# Patient Record
Sex: Female | Born: 1995 | Race: Black or African American | Hispanic: No | Smoking: Never smoker
Health system: Southern US, Community
[De-identification: ages and names within clinical notes are randomized; demographics above are authoritative.]

## PROBLEM LIST (undated history)

## (undated) ENCOUNTER — Ambulatory Visit (HOSPITAL_COMMUNITY): Admission: EM | Source: Home / Self Care

## (undated) ENCOUNTER — Inpatient Hospital Stay (HOSPITAL_COMMUNITY): Payer: Self-pay

## (undated) ENCOUNTER — Ambulatory Visit (HOSPITAL_COMMUNITY): Payer: Self-pay | Admitting: Obstetrics & Gynecology

## (undated) ENCOUNTER — Encounter (HOSPITAL_COMMUNITY): Payer: Self-pay

## (undated) DIAGNOSIS — N390 Urinary tract infection, site not specified: Secondary | ICD-10-CM

## (undated) DIAGNOSIS — E669 Obesity, unspecified: Secondary | ICD-10-CM

## (undated) HISTORY — PX: INDUCED ABORTION: SHX677

## (undated) HISTORY — DX: Obesity, unspecified: E66.9

## (undated) HISTORY — PX: OTHER SURGICAL HISTORY: SHX169

## (undated) SURGERY — DILATION AND EVACUATION, UTERUS
Anesthesia: Surgeon Has No Preference | Site: Uterus

---
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
## 2015-01-10 ENCOUNTER — Encounter: Payer: Self-pay | Admitting: *Deleted

## 2015-01-10 DIAGNOSIS — Z3490 Encounter for supervision of normal pregnancy, unspecified, unspecified trimester: Secondary | ICD-10-CM

## 2015-01-10 DIAGNOSIS — D573 Sickle-cell trait: Secondary | ICD-10-CM

## 2015-01-20 ENCOUNTER — Ambulatory Visit (INDEPENDENT_AMBULATORY_CARE_PROVIDER_SITE_OTHER): Payer: Self-pay | Admitting: Obstetrics and Gynecology

## 2015-01-20 ENCOUNTER — Encounter: Payer: Self-pay | Admitting: *Deleted

## 2015-01-20 ENCOUNTER — Encounter: Payer: Self-pay | Admitting: Obstetrics and Gynecology

## 2015-01-20 VITALS — BP 125/80 | HR 104 | Temp 98.0°F | Ht 62.0 in | Wt 246.1 lb

## 2015-01-20 DIAGNOSIS — Z23 Encounter for immunization: Secondary | ICD-10-CM

## 2015-01-20 DIAGNOSIS — Z3493 Encounter for supervision of normal pregnancy, unspecified, third trimester: Secondary | ICD-10-CM

## 2015-01-20 LAB — CBC
HEMATOCRIT: 33.8 % — AB (ref 36.0–46.0)
Hemoglobin: 11 g/dL — ABNORMAL LOW (ref 12.0–15.0)
MCH: 26.4 pg (ref 26.0–34.0)
MCHC: 32.5 g/dL (ref 30.0–36.0)
MCV: 81.3 fL (ref 78.0–100.0)
MPV: 11 fL (ref 8.6–12.4)
PLATELETS: 258 10*3/uL (ref 150–400)
RBC: 4.16 MIL/uL (ref 3.87–5.11)
RDW: 15.5 % (ref 11.5–15.5)
WBC: 12.7 10*3/uL — ABNORMAL HIGH (ref 4.0–10.5)

## 2015-01-20 LAB — POCT URINALYSIS DIP (DEVICE)
Bilirubin Urine: NEGATIVE
Glucose, UA: NEGATIVE mg/dL
HGB URINE DIPSTICK: NEGATIVE
KETONES UR: NEGATIVE mg/dL
Leukocytes, UA: NEGATIVE
Nitrite: NEGATIVE
PH: 6 (ref 5.0–8.0)
PROTEIN: NEGATIVE mg/dL
SPECIFIC GRAVITY, URINE: 1.015 (ref 1.005–1.030)
UROBILINOGEN UA: 0.2 mg/dL (ref 0.0–1.0)

## 2015-01-20 MED ORDER — TETANUS-DIPHTH-ACELL PERTUSSIS 5-2.5-18.5 LF-MCG/0.5 IM SUSP
0.5000 mL | Freq: Once | INTRAMUSCULAR | Status: AC
  Administered 2015-01-20: 0.5 mL via INTRAMUSCULAR

## 2015-01-20 NOTE — Progress Notes (Signed)
Patient reports round ligament pain; states no prenatal care since 18 weeks  New OB & 28 week packets given

## 2015-01-20 NOTE — Progress Notes (Signed)
Subjective:  Beverly Anthony is a 19 y.o. G2P0010 at 7365w6d being seen today for ongoing prenatal care.  Patient reports no complaints.  Contractions: Not present.  Vag. Bleeding: None. Movement: Present. Denies leaking of fluid.   The following portions of the patient's history were reviewed and updated as appropriate: allergies, current medications, past family history, past medical history, past social history, past surgical history and problem list. Problem list updated.  Objective:   Filed Vitals:   01/20/15 0907 01/20/15 0909  BP: 125/80   Pulse: 104   Temp: 98 F (36.7 C)   Height:  5\' 2"  (1.575 m)  Weight: 246 lb 1.6 oz (111.63 kg)     Fetal Status: Fetal Heart Rate (bpm): 147   Movement: Present     General:  Alert, oriented and cooperative. Patient is in no acute distress.  Skin: Skin is warm and dry. No rash noted.   Cardiovascular: Normal heart rate noted  Respiratory: Normal respiratory effort, no problems with respiration noted  Abdomen: Soft, gravid, appropriate for gestational age. Pain/Pressure: Present     Pelvic: Vag. Bleeding: None     Cervical exam deferred        Extremities: Normal range of motion.  Edema: None  Mental Status: Normal mood and affect. Normal behavior. Normal judgment and thought content.   Urinalysis: Urine Protein: Negative Urine Glucose: Negative  Assessment and Plan:  Pregnancy: G2P0010 at 3965w6d  1. Supervision of normal pregnancy, third trimester - Glucose Tolerance, 1 HR (50g) w/o Fasting - CBC - RPR - HIV antibody (with reflex) - records requested for 18-wk anatomy scan - Tdap (BOOSTRIX) injection 0.5 mL; Inject 0.5 mLs into the muscle once. - Flu Vaccine QUAD 36+ mos IM; Standing - Flu Vaccine QUAD 36+ mos IM  # sickle cell trait - testing information given to FOB and discussed importance of this  Preterm labor symptoms and general obstetric precautions including but not limited to vaginal bleeding, contractions, leaking of  fluid and fetal movement were reviewed in detail with the patient. Please refer to After Visit Summary for other counseling recommendations.  Return in about 2 weeks (around 02/03/2015).   Beverly RunningNoah Bedford Kellina Dreese, MD

## 2015-01-21 LAB — HIV ANTIBODY (ROUTINE TESTING W REFLEX): HIV 1&2 Ab, 4th Generation: NONREACTIVE

## 2015-01-21 LAB — GLUCOSE TOLERANCE, 1 HOUR (50G) W/O FASTING: Glucose, 1 Hour GTT: 86 mg/dL (ref 70–140)

## 2015-01-21 LAB — RPR

## 2015-01-26 ENCOUNTER — Other Ambulatory Visit (HOSPITAL_COMMUNITY): Payer: Self-pay

## 2015-02-03 ENCOUNTER — Encounter: Payer: Self-pay | Admitting: Advanced Practice Midwife

## 2015-02-03 ENCOUNTER — Ambulatory Visit (HOSPITAL_COMMUNITY)
Admission: RE | Admit: 2015-02-03 | Discharge: 2015-02-03 | Disposition: A | Discharge status: Home / Self Care | Payer: Medicaid Other | Source: Ambulatory Visit | Attending: Advanced Practice Midwife | Admitting: Advanced Practice Midwife

## 2015-02-03 ENCOUNTER — Ambulatory Visit (INDEPENDENT_AMBULATORY_CARE_PROVIDER_SITE_OTHER): Payer: Medicaid Other | Admitting: Advanced Practice Midwife

## 2015-02-03 VITALS — BP 126/75 | HR 110 | Temp 97.9°F | Wt 244.5 lb

## 2015-02-03 DIAGNOSIS — Z36 Encounter for antenatal screening of mother: Secondary | ICD-10-CM | POA: Insufficient documentation

## 2015-02-03 DIAGNOSIS — Z3A33 33 weeks gestation of pregnancy: Secondary | ICD-10-CM | POA: Diagnosis not present

## 2015-02-03 DIAGNOSIS — Z3493 Encounter for supervision of normal pregnancy, unspecified, third trimester: Secondary | ICD-10-CM

## 2015-02-03 LAB — POCT URINALYSIS DIP (DEVICE)
BILIRUBIN URINE: NEGATIVE
GLUCOSE, UA: NEGATIVE mg/dL
Nitrite: NEGATIVE
Protein, ur: NEGATIVE mg/dL
SPECIFIC GRAVITY, URINE: 1.02 (ref 1.005–1.030)
Urobilinogen, UA: 1 mg/dL (ref 0.0–1.0)
pH: 6 (ref 5.0–8.0)

## 2015-02-03 MED ORDER — PANTOPRAZOLE SODIUM 20 MG PO TBEC
20.0000 mg | DELAYED_RELEASE_TABLET | Freq: Every day | ORAL | Status: DC
Start: 2015-02-03 — End: 2015-02-09

## 2015-02-03 NOTE — Patient Instructions (Signed)

## 2015-02-03 NOTE — Addendum Note (Signed)
Addended by: Garret ReddishBARNES, Phill Steck M on: 02/03/2015 09:17 AM   Modules accepted: Orders

## 2015-02-03 NOTE — Progress Notes (Signed)
Pt reports having severe heart burn TUMS not effective Educated pt on Benefits of Breastfeeding for Mom

## 2015-02-03 NOTE — Progress Notes (Signed)
Subjective:  Beverly Anthony is a 19 y.o. G2P0010 at 5331w6d being seen today for ongoing prenatal care.  Patient reports heartburn.  Tums is not working anymore.  Contractions: Not present.  Vag. Bleeding: None. Movement: Present. Denies leaking of fluid.   The following portions of the patient's history were reviewed and updated as appropriate: allergies, current medications, past family history, past medical history, past social history, past surgical history and problem list. Problem list updated.  Objective:   Filed Vitals:   02/03/15 0825  BP: 126/75  Pulse: 110  Temp: 97.9 F (36.6 C)  Weight: 244 lb 8 oz (110.904 kg)    Fetal Status: Fetal Heart Rate (bpm): 156   Movement: Present     General:  Alert, oriented and cooperative. Patient is in no acute distress.  Skin: Skin is warm and dry. No rash noted.   Cardiovascular: Normal heart rate noted  Respiratory: Normal respiratory effort, no problems with respiration noted  Abdomen: Soft, gravid, appropriate for gestational age. Pain/Pressure: Present     Pelvic: Vag. Bleeding: None     Cervical exam deferred        Extremities: Normal range of motion.  Edema: None  Mental Status: Normal mood and affect. Normal behavior. Normal judgment and thought content.   Urinalysis: Urine Protein: Negative Urine Glucose: Negative  Assessment and Plan:  Pregnancy: G2P0010 at 4131w6d  1. Supervision of normal pregnancy, third trimester        Preterm labor symptoms and general obstetric precautions including but not limited to vaginal bleeding, contractions, leaking of fluid and fetal movement were reviewed in detail with the patient. Please refer to After Visit Summary for other counseling recommendations.  Return in about 2 weeks (around 02/17/2015) for [facility].   Aviva SignsMarie L Wilbon Obenchain, CNM

## 2015-02-09 ENCOUNTER — Encounter (HOSPITAL_COMMUNITY): Payer: Self-pay | Admitting: *Deleted

## 2015-02-09 ENCOUNTER — Encounter: Payer: Self-pay | Admitting: Advanced Practice Midwife

## 2015-02-09 ENCOUNTER — Inpatient Hospital Stay (HOSPITAL_COMMUNITY): Payer: Medicaid Other

## 2015-02-09 ENCOUNTER — Telehealth: Payer: Self-pay | Admitting: General Practice

## 2015-02-09 ENCOUNTER — Inpatient Hospital Stay (HOSPITAL_COMMUNITY)
Admission: AD | Admit: 2015-02-09 | Discharge: 2015-02-09 | Disposition: A | Discharge status: Home / Self Care | Payer: Medicaid Other | Source: Ambulatory Visit | Attending: Obstetrics & Gynecology | Admitting: Obstetrics & Gynecology

## 2015-02-09 DIAGNOSIS — R8271 Bacteriuria: Secondary | ICD-10-CM

## 2015-02-09 DIAGNOSIS — Z3A34 34 weeks gestation of pregnancy: Secondary | ICD-10-CM | POA: Diagnosis not present

## 2015-02-09 DIAGNOSIS — O99613 Diseases of the digestive system complicating pregnancy, third trimester: Secondary | ICD-10-CM | POA: Insufficient documentation

## 2015-02-09 DIAGNOSIS — O26893 Other specified pregnancy related conditions, third trimester: Secondary | ICD-10-CM | POA: Diagnosis present

## 2015-02-09 DIAGNOSIS — R1013 Epigastric pain: Secondary | ICD-10-CM

## 2015-02-09 DIAGNOSIS — O2393 Unspecified genitourinary tract infection in pregnancy, third trimester: Secondary | ICD-10-CM | POA: Insufficient documentation

## 2015-02-09 DIAGNOSIS — O2343 Unspecified infection of urinary tract in pregnancy, third trimester: Secondary | ICD-10-CM

## 2015-02-09 DIAGNOSIS — K8020 Calculus of gallbladder without cholecystitis without obstruction: Secondary | ICD-10-CM | POA: Diagnosis not present

## 2015-02-09 HISTORY — DX: Calculus of gallbladder without cholecystitis without obstruction: K80.20

## 2015-02-09 LAB — COMPREHENSIVE METABOLIC PANEL
ALBUMIN: 2.7 g/dL — AB (ref 3.5–5.0)
ALK PHOS: 122 U/L (ref 38–126)
ALT: 16 U/L (ref 14–54)
ANION GAP: 8 (ref 5–15)
AST: 19 U/L (ref 15–41)
BUN: 5 mg/dL — ABNORMAL LOW (ref 6–20)
CALCIUM: 9.2 mg/dL (ref 8.9–10.3)
CO2: 24 mmol/L (ref 22–32)
Chloride: 103 mmol/L (ref 101–111)
Creatinine, Ser: 0.56 mg/dL (ref 0.44–1.00)
GFR calc Af Amer: >60 mL/min (ref >60)
GFR calc non Af Amer: >60 mL/min (ref >60)
GLUCOSE: 113 mg/dL — AB (ref 65–99)
POTASSIUM: 3.7 mmol/L (ref 3.5–5.1)
SODIUM: 135 mmol/L (ref 135–145)
Total Bilirubin: 0.5 mg/dL (ref 0.3–1.2)
Total Protein: 7.1 g/dL (ref 6.5–8.1)

## 2015-02-09 LAB — URINALYSIS, ROUTINE W REFLEX MICROSCOPIC
Bilirubin Urine: NEGATIVE
Glucose, UA: NEGATIVE mg/dL
Hgb urine dipstick: NEGATIVE
Ketones, ur: 15 mg/dL — AB
NITRITE: NEGATIVE
Protein, ur: NEGATIVE mg/dL
SPECIFIC GRAVITY, URINE: 1.02 (ref 1.005–1.030)
pH: 7.5 (ref 5.0–8.0)

## 2015-02-09 LAB — CBC
HCT: 30.5 % — ABNORMAL LOW (ref 36.0–46.0)
Hemoglobin: 10.4 g/dL — ABNORMAL LOW (ref 12.0–15.0)
MCH: 26.8 pg (ref 26.0–34.0)
MCHC: 34.1 g/dL (ref 30.0–36.0)
MCV: 78.6 fL (ref 78.0–100.0)
PLATELETS: 185 10*3/uL (ref 150–400)
RBC: 3.88 MIL/uL (ref 3.87–5.11)
RDW: 14.5 % (ref 11.5–15.5)
WBC: 15.7 10*3/uL — ABNORMAL HIGH (ref 4.0–10.5)

## 2015-02-09 LAB — URINE MICROSCOPIC-ADD ON

## 2015-02-09 LAB — RAPID URINE DRUG SCREEN, HOSP PERFORMED
AMPHETAMINES: NOT DETECTED
Barbiturates: NOT DETECTED
Benzodiazepines: NOT DETECTED
Cocaine: NOT DETECTED
Opiates: NOT DETECTED
Tetrahydrocannabinol: NOT DETECTED

## 2015-02-09 LAB — LIPASE, BLOOD: Lipase: 34 U/L (ref 11–51)

## 2015-02-09 MED ORDER — DEXTROSE 5 % IV SOLN
2.0000 g | Freq: Once | INTRAVENOUS | Status: DC
  Filled 2015-02-09: qty 2

## 2015-02-09 MED ORDER — ONDANSETRON 4 MG PO TBDP
4.0000 mg | ORAL_TABLET | Freq: Once | ORAL | Status: DC
  Filled 2015-02-09: qty 1

## 2015-02-09 MED ORDER — FAMOTIDINE IN NACL 20-0.9 MG/50ML-% IV SOLN
20.0000 mg | Freq: Once | INTRAVENOUS | Status: AC
  Administered 2015-02-09: 20 mg via INTRAVENOUS
  Filled 2015-02-09: qty 50

## 2015-02-09 MED ORDER — DEXTROSE 5 % IV SOLN
2.0000 g | INTRAVENOUS | Status: DC
  Administered 2015-02-09: 2 g via INTRAVENOUS
  Filled 2015-02-09: qty 2

## 2015-02-09 MED ORDER — OXYCODONE-ACETAMINOPHEN 5-325 MG PO TABS
2.0000 | ORAL_TABLET | Freq: Once | ORAL | Status: AC
  Administered 2015-02-09: 2 via ORAL
  Filled 2015-02-09: qty 2

## 2015-02-09 MED ORDER — GI COCKTAIL ~~LOC~~
30.0000 mL | Freq: Once | ORAL | Status: AC
  Administered 2015-02-09: 30 mL via ORAL
  Filled 2015-02-09: qty 30

## 2015-02-09 MED ORDER — SODIUM CHLORIDE 0.9 % IV BOLUS (SEPSIS)
1000.0000 mL | Freq: Once | INTRAVENOUS | Status: AC
  Administered 2015-02-09: 1000 mL via INTRAVENOUS

## 2015-02-09 MED ORDER — FENTANYL CITRATE (PF) 100 MCG/2ML IJ SOLN
50.0000 ug | Freq: Once | INTRAMUSCULAR | Status: AC
  Administered 2015-02-09: 50 ug via INTRAVENOUS
  Filled 2015-02-09: qty 2

## 2015-02-09 MED ORDER — SODIUM CHLORIDE 0.9 % IV SOLN: Freq: Once | INTRAVENOUS | Status: DC

## 2015-02-09 MED ORDER — CEPHALEXIN 500 MG PO CAPS: 500.0000 mg | ORAL_CAPSULE | Freq: Four times a day (QID) | ORAL | Status: DC

## 2015-02-09 MED ORDER — ONDANSETRON HCL 4 MG/2ML IJ SOLN
4.0000 mg | Freq: Once | INTRAMUSCULAR | Status: AC
  Administered 2015-02-09: 4 mg via INTRAVENOUS
  Filled 2015-02-09: qty 2

## 2015-02-09 MED ORDER — ONDANSETRON 8 MG PO TBDP: 8.0000 mg | ORAL_TABLET | Freq: Three times a day (TID) | ORAL | Status: DC | PRN

## 2015-02-09 MED ORDER — OXYCODONE-ACETAMINOPHEN 5-325 MG PO TABS: 1.0000 | ORAL_TABLET | Freq: Four times a day (QID) | ORAL | Status: DC | PRN

## 2015-02-09 MED ORDER — HYDROMORPHONE HCL 1 MG/ML IJ SOLN
1.0000 mg | Freq: Once | INTRAMUSCULAR | Status: AC
  Administered 2015-02-09: 1 mg via INTRAVENOUS
  Filled 2015-02-09: qty 1

## 2015-02-09 NOTE — MAU Provider Note (Signed)
None     Chief Complaint:  Contractions and Emesis During Pregnancy   [place] Orvan Falconer is  19 y.o. G2P0010 at [redacted]w[redacted]d presents complaining of Contractions and Emesis During Pregnancy Pt. Presented this evening with acute onset back pain leading to abdominal pain around 0400 this am. She has vomited 3 times this am as well. Her pain is now in the anterior part of her abdomen. She says that it is "severe" and is constant, nonradiating, feels like a "burning pain". She denies suspicious food recently. Has not had any diarrhea. No fevers or chills. She denies dysuria, hematuria. She denies vaginal bleeding, LOF, has +FM. No sick contacts. No abdominal trauma.   Obstetrical/Gynecological History: OB History    Gravida Para Term Preterm AB TAB SAB Ectopic Multiple Living        Obstetric Comments   03/12/14 SAB 12 wks     Past Medical History: Past Medical History  Diagnosis Date  . Medical history non-contributory     Past Surgical History: Past Surgical History  Procedure Laterality Date  . No past surgeries      Family History: Family History  Problem Relation Age of Onset  . Cancer Paternal Grandfather   . Diabetes Father   . Hypertension Father     Social History: Social History  Substance Use Topics  . Smoking status: Never Smoker   . Smokeless tobacco: Never Used  . Alcohol Use: No    Allergies: No Known Allergies  Meds:  Prescriptions prior to admission  Medication Sig Dispense Refill Last Dose  . acetaminophen (TYLENOL) 500 MG tablet Take 1,000 mg by mouth every 6 (six) hours as needed for headache.   02/09/2015 at Unknown time  . calcium carbonate (TUMS - DOSED IN MG ELEMENTAL CALCIUM) 500 MG chewable tablet Chew 2 tablets by mouth 4 (four) times daily as needed for indigestion.    02/08/2015 at Unknown time  . Prenatal Vit-Fe Fumarate-FA (PRENATAL MULTIVITAMIN) TABS tablet Take 1 tablet by mouth daily at 12 noon.   02/08/2015 at Unknown  time    Review of Systems -   Review of Systems  Constitutional: Negative for fever, chills, weight loss, malaise/fatigue and diaphoresis.  HENT: Negative for hearing loss, ear pain, nosebleeds, congestion, sore throat, neck pain, tinnitus and ear discharge.   Eyes: Negative for blurred vision, double vision, photophobia, pain, discharge and redness.  Respiratory: Negative for cough, hemoptysis, sputum production, shortness of breath, wheezing and stridor.   Cardiovascular: Negative for chest pain, palpitations, orthopnea,  leg swelling  Gastrointestinal: Negative for abdominal pain heartburn, nausea, vomiting, diarrhea, constipation, blood in stool Genitourinary: Negative for dysuria, urgency, frequency, hematuria and flank pain.  Musculoskeletal: Negative for myalgias, back pain, joint pain and falls.  Skin: Negative for itching and rash.  Neurological: Negative for dizziness, tingling, tremors, sensory change, speech change, focal weakness, seizures, loss of consciousness, weakness and headaches.  Endo/Heme/Allergies: Negative for environmental allergies and polydipsia. Does not bruise/bleed easily.  Psychiatric/Behavioral: Negative for depression, suicidal ideas, hallucinations, memory loss and substance abuse. The patient is not nervous/anxious and does not have insomnia.      Physical Exam  Blood pressure 129/63, pulse 109, temperature 98.4 F (36.9 C), temperature source Oral, resp. rate 18, last menstrual period 06/11/2014. GENERAL: Well-developed, well-nourished female in no acute distress., Obese.  LUNGS: Clear to auscultation bilaterally.  HEART: Regular rate and rhythm. ABDOMEN: Soft, nontender, nondistended, gravid.  EXTREMITIES: Nontender, no edema,  2+ distal pulses. DTR's 2+ CERVICAL EXAM: Dilatation  0 cm   Effacement 0 %, posterior  Presentation:   FHT:  Baseline rate   bpm   Variability moderate  Accelerations present   Decelerations none Contractions: none    Labs: Results for orders placed or performed during the hospital encounter of 02/09/15 (from the past 24 hour(s))  Comprehensive metabolic panel   Collection Time: 02/09/15  6:00 AM  Result Value Ref Range   Sodium 135 135 - 145 mmol/L   Potassium 3.7 3.5 - 5.1 mmol/L   Chloride 103 101 - 111 mmol/L   CO2 24 22 - 32 mmol/L   Glucose, Bld 113 (H) 65 - 99 mg/dL   BUN 5 (L) 6 - 20 mg/dL   Creatinine, Ser 4.09 0.44 - 1.00 mg/dL   Calcium 9.2 8.9 - 81.1 mg/dL   Total Protein 7.1 6.5 - 8.1 g/dL   Albumin 2.7 (L) 3.5 - 5.0 g/dL   AST 19 15 - 41 U/L   ALT 16 14 - 54 U/L   Alkaline Phosphatase 122 38 - 126 U/L   Total Bilirubin 0.5 0.3 - 1.2 mg/dL   GFR calc non Af Amer >60 >60 mL/min   GFR calc Af Amer >60 >60 mL/min   Anion gap 8 5 - 15  Lipase, blood   Collection Time: 02/09/15  6:00 AM  Result Value Ref Range   Lipase 34 11 - 51 U/L  CBC   Collection Time: 02/09/15  6:19 AM  Result Value Ref Range   WBC 15.7 (H) 4.0 - 10.5 K/uL   RBC 3.88 3.87 - 5.11 MIL/uL   Hemoglobin 10.4 (L) 12.0 - 15.0 g/dL   HCT 91.4 (L) 78.2 - 95.6 %   MCV 78.6 78.0 - 100.0 fL   MCH 26.8 26.0 - 34.0 pg   MCHC 34.1 30.0 - 36.0 g/dL   RDW 21.3 08.6 - 57.8 %   Platelets 185 150 - 400 K/uL  Urinalysis, Routine w reflex microscopic (not at [facility])   Collection Time: 02/09/15  7:55 AM  Result Value Ref Range   Color, Urine YELLOW YELLOW   APPearance CLEAR CLEAR   Specific Gravity, Urine 1.020 1.005 - 1.030   pH 7.5 5.0 - 8.0   Glucose, UA NEGATIVE NEGATIVE mg/dL   Hgb urine dipstick NEGATIVE NEGATIVE   Bilirubin Urine NEGATIVE NEGATIVE   Ketones, ur 15 (A) NEGATIVE mg/dL   Protein, ur NEGATIVE NEGATIVE mg/dL   Nitrite NEGATIVE NEGATIVE   Leukocytes, UA SMALL (A) NEGATIVE  Urine rapid drug screen (hosp performed)   Collection Time: 02/09/15  7:55 AM  Result Value Ref Range   Opiates NONE DETECTED NONE DETECTED   Cocaine NONE DETECTED NONE DETECTED   Benzodiazepines NONE DETECTED NONE DETECTED    Amphetamines NONE DETECTED NONE DETECTED   Tetrahydrocannabinol NONE DETECTED NONE DETECTED   Barbiturates NONE DETECTED NONE DETECTED  Urine microscopic-add on   Collection Time: 02/09/15  7:55 AM  Result Value Ref Range   Squamous Epithelial / LPF 0-5 (A) NONE SEEN   WBC, UA 6-30 0 - 5 WBC/hpf   RBC / HPF 0-5 0 - 5 RBC/hpf   Bacteria, UA MANY (A) NONE SEEN   Imaging Studies:  [place] Mfm Ob Comp + 14 Wk  02/03/2015  OBSTETRICAL ULTRASOUND: This exam was performed within a [facility]. The OB US report was generated in the AS system, and faxed to the ordering physician.  This report is  available in the [facility]. See the AS Obstetric US report via the Image Link.   Assessment: [place] Campbell is  19 y.o. G2P0010 at [redacted]w[redacted]d presents with abdominal pain. Differential includes Reflux vs Gallbladder vs. PUD vs. Pyelonephritis. Patient found to have UA positive for many bacteria and 6-10 WBC in urine, with hx of back pain, and N/V. Patient also noted to have elevated WBC count of 15.7, which is slightly elevated even during pregnancy. Due to patient's severe abdominal pain and abdominal ultrasound was performed, and patient was found to be positive for several gallbladder stones indicating patient with cholelithiasis   MAU Course/MDM:  - Will get  CBC, CMP, Urinalysis, GI Cocktail, NS bolus, Zofran, Pepcid.   -Dorathy Kinsman, CNM assumed care of pt at 0800. Pt vomited after GI cocktail, still in significant pain. Fentanyl ordered. Consulted w/ Dr. Adrian Blackwater. Add Lipase. Will Tx for complicated UTI vs Pyelo Rocephin. May need Abd [place].  -still in significant pain after Fentanyl. Nausea continues. Abd [place] after NPO x 6 hours.  -US shows Gall stones w/ out evidence of cholecystitis. Pt tolerating PO fluids and meds. Pain 3/1- after percocet. Per consult w/ Dr. Adrian Blackwater will D/C home w/ antiemetics, pain meds and OP Surgical consult to discuss PP cholecystectomy.   ASSESSMENT: 1.  Gallbladder stone without cholecystitis or obstruction   2. Epigastric abdominal pain   3. UTI in pregnancy, third trimester     PLAN: D/C home Advance diet Slowly. Avoid fatty foods. Ambulatory referral to general surgery. Urine culture pending. Return to MAU as needed for fever greater than 100.4, worsening pain or if unable to keep down medication or fluids. Follow-up Information    Schedule an appointment as soon as possible for a visit with [facility].   Specialty:  General Surgery   Why:  surgical consult for gall stones   Contact information:   [address] [place] [address] [phone]       Follow up with [facility] On 02/11/2015.   Specialty:  Obstetrics and Gynecology   Why:  at 3:15   Contact information:   [address] [place] [address] [phone]      Follow up with [facility].   Why:  As needed in emergencies (fever greater that 100.4, severe pain, if unable to keep down medicine and fluids)   Contact information:   [address] [id number] mc [place] [address] [phone]        Medication List    TAKE these medications        acetaminophen 500 MG tablet  Commonly known as:  TYLENOL  Take 1,000 mg by mouth every 6 (six) hours as needed for headache.     calcium carbonate 500 MG chewable tablet  Commonly known as:  TUMS - dosed in mg elemental calcium  Chew 2 tablets by mouth 4 (four) times daily as needed for indigestion.     cephALEXin 500 MG capsule  Commonly known as:  KEFLEX  Take 1 capsule (500 mg total) by mouth 4 (four) times daily.     ondansetron 8 MG disintegrating tablet  Commonly known as:  ZOFRAN ODT  Take 1 tablet (8 mg total) by mouth every 8 (eight) hours as needed for nausea or vomiting.     oxyCODONE-acetaminophen 5-325 MG tablet  Commonly known as:  PERCOCET/ROXICET  Take 1-2 tablets by  mouth every 6 (six) hours as  needed for severe pain.     prenatal multivitamin Tabs tablet  Take 1 tablet by mouth daily at 12 noon.       Caleb Melancon 11/21/20168:58 AM  I was present for the exam and agree with above.  [place] Jin Shock[place], [place] 02/10/2015 3:58 PM

## 2015-02-09 NOTE — Progress Notes (Signed)
Dr. Cathlean CowerMikell informed of pt's increased pain, states she is coming to talk to pt.

## 2015-02-09 NOTE — Discharge Instructions (Signed)
Cholelithiasis °Cholelithiasis (also called gallstones) is a form of gallbladder disease in which gallstones form in your gallbladder. The gallbladder is an organ that stores bile made in the liver, which helps digest fats. Gallstones begin as small crystals and slowly grow into stones. Gallstone pain occurs when the gallbladder spasms and a gallstone is blocking the duct. Pain can also occur when a stone passes out of the duct.  °RISK FACTORS °· Being female.   °· Having multiple pregnancies. Health care providers sometimes advise removing diseased gallbladders before future pregnancies.   °· Being obese. °· Eating a diet heavy in fried foods and fat.   °· Being older than 60 years and increasing age.   °· Prolonged use of medicines containing female hormones.   °· Having diabetes mellitus.   °· Rapidly losing weight.   °· Having a family history of gallstones (heredity).   °SYMPTOMS °· Nausea.   °· Vomiting. °· Abdominal pain.   °· Yellowing of the skin (jaundice).   °· Sudden pain. It may persist from several minutes to several hours. °· Fever.   °· Tenderness to the touch.  °In some cases, when gallstones do not move into the bile duct, people have no pain or symptoms. These are called "silent" gallstones.  °TREATMENT °Silent gallstones do not need treatment. In severe cases, emergency surgery may be required. Options for treatment include: °· Surgery to remove the gallbladder. This is the most common treatment. °· Medicines. These do not always work and may take 6-12 months or more to work. °· Shock wave treatment (extracorporeal biliary lithotripsy). In this treatment an ultrasound machine sends shock waves to the gallbladder to break gallstones into smaller pieces that can pass into the intestines or be dissolved by medicine. °HOME CARE INSTRUCTIONS  °· Only take over-the-counter or prescription medicines for pain, discomfort, or fever as directed by your health care provider.   °· Follow a low-fat diet until  seen again by your health care provider. Fat causes the gallbladder to contract, which can result in pain.   °· Follow up with your health care provider as directed. Attacks are almost always recurrent and surgery is usually required for permanent treatment.   °SEEK IMMEDIATE MEDICAL CARE IF:  °· Your pain increases and is not controlled by medicines.   °· You have a fever or persistent symptoms for more than 2-3 days.   °· You have a fever and your symptoms suddenly get worse.   °· You have persistent nausea and vomiting.   °MAKE SURE YOU:  °· Understand these instructions. °· Will watch your condition. °· Will get help right away if you are not doing well or get worse. °  °This information is not intended to replace advice given to you by your health care provider. Make sure you discuss any questions you have with your health care provider. °  °Document Released: 03/03/2005 Document Revised: 11/07/2012 Document Reviewed: 08/29/2012 °Elsevier Interactive Patient Education ©2016 [employer] ° °Biliary Colic °Biliary colic is a pain in the upper abdomen. The pain: °· Is usually felt on the right side of the abdomen, but it may also be felt in the center of the abdomen, just below the breastbone (sternum). °· May spread back toward the right shoulder blade. °· May be steady or irregular. °· May be accompanied by nausea and vomiting. °Most of the time, the pain goes away in 1-5 hours. After the most intense pain passes, the abdomen may continue to ache mildly for about 24 hours. °Biliary colic is caused by a blockage in the bile duct. The bile duct is a   pathway that carries bile--a liquid that helps to digest fats--from the gallbladder to the small intestine. Biliary colic usually occurs after eating, when the digestive system demands bile. The pain develops when muscle cells contract forcefully to try to move the blockage so that bile can get by. °HOME CARE INSTRUCTIONS °· Take medicines only as directed by your  health care provider. °· Drink enough fluid to keep your urine clear or pale yellow. °· Avoid fatty, greasy, and fried foods. These kinds of foods increase your body's demand for bile. °· Avoid any foods that make your pain worse. °· Avoid overeating. °· Avoid having a large meal after fasting. °SEEK MEDICAL CARE IF: °· You develop a fever. °· Your pain gets worse. °· You vomit. °· You develop nausea that prevents you from eating and drinking. °SEEK IMMEDIATE MEDICAL CARE IF: °· You suddenly develop a fever and shaking chills. °· You develop a yellowish discoloration (jaundice) of: °¨ Skin. °¨ Whites of the eyes. °¨ Mucous membranes. °· You have continuous or severe pain that is not relieved with medicines. °· You have nausea and vomiting that is not relieved with medicines. °· You develop dizziness or you faint. °  °This information is not intended to replace advice given to you by your health care provider. Make sure you discuss any questions you have with your health care provider. °  °Document Released: 08/08/2005 Document Revised: 07/22/2014 Document Reviewed: 12/17/2013 °Elsevier Interactive Patient Education ©2016 Elsevier Inc. ° °

## 2015-02-09 NOTE — Progress Notes (Signed)
Pt states pain is down to a 6, EFM reapplied. Pt wants to lie on R side, EFM readjusted.

## 2015-02-09 NOTE — MAU Note (Signed)
Dr. Cathlean CowerMikell in to see pt.

## 2015-02-09 NOTE — MAU Note (Signed)
Pt reports vomiting for 3 hours. And pains in her stomach "a couple of hours ago"

## 2015-02-09 NOTE — MAU Note (Signed)
Pt has taken EFM off, is crying & rocking in the bed, asking for pain medication.  Informed pt I will notify provider with pain med request, it is very important that we get consistent FHR tracing.  EFM not reapplied @ this time.

## 2015-02-09 NOTE — MAU Note (Signed)
Dilaudid 1 mg given for pain, pt continues to refuse EFM for now, CNM aware.

## 2015-02-09 NOTE — Progress Notes (Signed)
Pt states her pain is back up to an 8, can't tolerate monitor being on.  EFM removed. Will notify MD.

## 2015-02-09 NOTE — Progress Notes (Signed)
Dr. Cathlean CowerMikell informed pt is sobbing, rocking back & forth in bed, MD states she is on her way to see pt.

## 2015-02-09 NOTE — MAU Note (Signed)
[facility], states she is too weak to walk.  Urine specimen obtained.  Pt asking for pain medication, Dr. Jaquita RectorMelancon informed.

## 2015-02-09 NOTE — Telephone Encounter (Signed)
Per Beverly KinsmanVirginia Anthony, patient needs referral to general surgery for gallstones. appt made with [facility] [place] for 12/12 @ 910. Called patient's number and number is not working. Called patient's sister at emergency contact number and told her to have [name] call us regarding upcoming appts. She stated she would

## 2015-02-11 ENCOUNTER — Encounter: Payer: Medicaid Other | Admitting: Advanced Practice Midwife

## 2015-02-11 ENCOUNTER — Ambulatory Visit: Payer: Medicaid Other | Admitting: Obstetrics and Gynecology

## 2015-02-11 DIAGNOSIS — R8271 Bacteriuria: Secondary | ICD-10-CM

## 2015-02-11 LAB — CULTURE, OB URINE

## 2015-02-11 NOTE — Telephone Encounter (Signed)
Attempted to contact pt was informed I had the wrong #.  Letter sent to inform [place] of [facility] [place].

## 2015-02-16 ENCOUNTER — Other Ambulatory Visit: Payer: Self-pay | Admitting: General Practice

## 2015-02-16 ENCOUNTER — Encounter: Payer: Medicaid Other | Admitting: Obstetrics & Gynecology

## 2015-02-16 NOTE — Telephone Encounter (Signed)
Patient called and left message stating she is calling for a refill. Patient states she is still having sharp pains. Per Dr Jolayne Pantheronstant, patient needs to take tylenol and avoid fatty foods. Patient also needs to keep appt on Thursday. Called patient, no answer- unable to leave message, phone kept ringing

## 2015-02-18 NOTE — Telephone Encounter (Signed)
Called pt and left message that I am calling with a response from the doctor. Please call back and state whether a detailed message can be left on her voice mail.

## 2015-02-19 ENCOUNTER — Encounter: Payer: Medicaid Other | Admitting: Student

## 2015-02-19 ENCOUNTER — Ambulatory Visit (INDEPENDENT_AMBULATORY_CARE_PROVIDER_SITE_OTHER): Payer: Medicaid Other | Admitting: Student

## 2015-02-19 ENCOUNTER — Other Ambulatory Visit (HOSPITAL_COMMUNITY)
Admission: RE | Admit: 2015-02-19 | Discharge: 2015-02-19 | Disposition: A | Discharge status: Home / Self Care | Payer: Medicaid Other | Source: Ambulatory Visit | Attending: Obstetrics and Gynecology | Admitting: Obstetrics and Gynecology

## 2015-02-19 VITALS — BP 116/60 | HR 96 | Temp 98.1°F | Wt 245.8 lb

## 2015-02-19 DIAGNOSIS — Z113 Encounter for screening for infections with a predominantly sexual mode of transmission: Secondary | ICD-10-CM | POA: Insufficient documentation

## 2015-02-19 DIAGNOSIS — Z3493 Encounter for supervision of normal pregnancy, unspecified, third trimester: Secondary | ICD-10-CM | POA: Diagnosis present

## 2015-02-19 LAB — POCT URINALYSIS DIP (DEVICE)
BILIRUBIN URINE: NEGATIVE
GLUCOSE, UA: NEGATIVE mg/dL
Ketones, ur: NEGATIVE mg/dL
NITRITE: NEGATIVE
Protein, ur: NEGATIVE mg/dL
Specific Gravity, Urine: 1.015 (ref 1.005–1.030)
UROBILINOGEN UA: 1 mg/dL (ref 0.0–1.0)
pH: 7 (ref 5.0–8.0)

## 2015-02-19 NOTE — Progress Notes (Signed)
Subjective:  Beverly Anthony is a 19 y.o. G2P0010 at 15109w1d being seen today for ongoing prenatal care.  She is currently monitored for the following issues for this low-risk pregnancy and has Supervision of normal pregnancy; Sickle cell trait (HCC); Gallbladder stone without cholecystitis or obstruction; and Group B streptococcal bacteriuria on her problem list.  Patient reports no complaints.  Contractions: Not present. Vag. Bleeding: None.  Movement: Present. Denies leaking of fluid.   The following portions of the patient's history were reviewed and updated as appropriate: allergies, current medications, past family history, past medical history, past social history, past surgical history and problem list. Problem list updated.  Objective:   Filed Vitals:   02/19/15 1444  BP: 116/60  Pulse: 96  Temp: 98.1 F (36.7 C)  Weight: 245 lb 12.8 oz (111.494 kg)    Fetal Status: Fetal Heart Rate (bpm): 145   Movement: Present     General:  Alert, oriented and cooperative. Patient is in no acute distress.  Skin: Skin is warm and dry. No rash noted.   Cardiovascular: Normal heart rate noted  Respiratory: Normal respiratory effort, no problems with respiration noted  Abdomen: Soft, gravid, appropriate for gestational age. Pain/Pressure: Present     Pelvic: Vag. Bleeding: None     Cervical exam performed        1/thick/-3  Extremities: Normal range of motion.  Edema: None  Mental Status: Normal mood and affect. Normal behavior. Normal judgment and thought content.   Urinalysis:      Assessment and Plan:  Pregnancy: G2P0010 at 84109w1d  1. Supervision of normal pregnancy, third trimester  - Culture, OB Urine - Urine cytology ancillary only - Culture, beta strep (group b only)  Preterm labor symptoms and general obstetric precautions including but not limited to vaginal bleeding, contractions, leaking of fluid and fetal movement were reviewed in detail with the patient. Please refer to  After Visit Summary for other counseling recommendations.  Return in about 1 week (around 02/26/2015).   Judeth HornErin Paiden Cavell, NP

## 2015-02-19 NOTE — Addendum Note (Signed)
Addended by: Judeth HornLAWRENCE, Naod Sweetland B on: 02/19/2015 04:13 PM   Modules accepted: Orders

## 2015-02-19 NOTE — Patient Instructions (Signed)
Fetal Movement Counts Patient Name: __________________________________________________ Patient Due Date: ____________________ Performing a fetal movement count is highly recommended in high-risk pregnancies, but it is good for every pregnant woman to do. Your health care provider may ask you to start counting fetal movements at 28 weeks of the pregnancy. Fetal movements often increase:  After eating a full meal.  After physical activity.  After eating or drinking something sweet or cold.  At rest. Pay attention to when you feel the baby is most active. This will help you notice a pattern of your baby's sleep and wake cycles and what factors contribute to an increase in fetal movement. It is important to perform a fetal movement count at the same time each day when your baby is normally most active.  HOW TO COUNT FETAL MOVEMENTS 1. Find a quiet and comfortable area to sit or lie down on your left side. Lying on your left side provides the best blood and oxygen circulation to your baby. 2. Write down the day and time on a sheet of paper or in a journal. 3. Start counting kicks, flutters, swishes, rolls, or jabs in a 2-hour period. You should feel at least 10 movements within 2 hours. 4. If you do not feel 10 movements in 2 hours, wait 2-3 hours and count again. Look for a change in the pattern or not enough counts in 2 hours. SEEK MEDICAL CARE IF:  You feel less than 10 counts in 2 hours, tried twice.  There is no movement in over an hour.  The pattern is changing or taking longer each day to reach 10 counts in 2 hours.  You feel the baby is not moving as he or she usually does. Date: ____________ Movements: ____________ Start time: ____________ Finish time: ____________  Date: ____________ Movements: ____________ Start time: ____________ Finish time: ____________ Date: ____________ Movements: ____________ Start time: ____________ Finish time: ____________ Date: ____________ Movements:  ____________ Start time: ____________ Finish time: ____________ Date: ____________ Movements: ____________ Start time: ____________ Finish time: ____________ Date: ____________ Movements: ____________ Start time: ____________ Finish time: ____________ Date: ____________ Movements: ____________ Start time: ____________ Finish time: ____________ Date: ____________ Movements: ____________ Start time: ____________ Finish time: ____________  Date: ____________ Movements: ____________ Start time: ____________ Finish time: ____________ Date: ____________ Movements: ____________ Start time: ____________ Finish time: ____________ Date: ____________ Movements: ____________ Start time: ____________ Finish time: ____________ Date: ____________ Movements: ____________ Start time: ____________ Finish time: ____________ Date: ____________ Movements: ____________ Start time: ____________ Finish time: ____________ Date: ____________ Movements: ____________ Start time: ____________ Finish time: ____________ Date: ____________ Movements: ____________ Start time: ____________ Finish time: ____________  Date: ____________ Movements: ____________ Start time: ____________ Finish time: ____________ Date: ____________ Movements: ____________ Start time: ____________ Finish time: ____________ Date: ____________ Movements: ____________ Start time: ____________ Finish time: ____________ Date: ____________ Movements: ____________ Start time: ____________ Finish time: ____________ Date: ____________ Movements: ____________ Start time: ____________ Finish time: ____________ Date: ____________ Movements: ____________ Start time: ____________ Finish time: ____________ Date: ____________ Movements: ____________ Start time: ____________ Finish time: ____________  Date: ____________ Movements: ____________ Start time: ____________ Finish time: ____________ Date: ____________ Movements: ____________ Start time: ____________ Finish  time: ____________ Date: ____________ Movements: ____________ Start time: ____________ Finish time: ____________ Date: ____________ Movements: ____________ Start time: ____________ Finish time: ____________ Date: ____________ Movements: ____________ Start time: ____________ Finish time: ____________ Date: ____________ Movements: ____________ Start time: ____________ Finish time: ____________ Date: ____________ Movements: ____________ Start time: ____________ Finish time: ____________  Date: ____________ Movements: ____________ Start time: ____________ Finish   time: ____________ Date: ____________ Movements: ____________ Start time: ____________ Finish time: ____________ Date: ____________ Movements: ____________ Start time: ____________ Finish time: ____________ Date: ____________ Movements: ____________ Start time: ____________ Finish time: ____________ Date: ____________ Movements: ____________ Start time: ____________ Finish time: ____________ Date: ____________ Movements: ____________ Start time: ____________ Finish time: ____________ Date: ____________ Movements: ____________ Start time: ____________ Finish time: ____________  Date: ____________ Movements: ____________ Start time: ____________ Finish time: ____________ Date: ____________ Movements: ____________ Start time: ____________ Finish time: ____________ Date: ____________ Movements: ____________ Start time: ____________ Finish time: ____________ Date: ____________ Movements: ____________ Start time: ____________ Finish time: ____________ Date: ____________ Movements: ____________ Start time: ____________ Finish time: ____________ Date: ____________ Movements: ____________ Start time: ____________ Finish time: ____________ Date: ____________ Movements: ____________ Start time: ____________ Finish time: ____________  Date: ____________ Movements: ____________ Start time: ____________ Finish time: ____________ Date: ____________  Movements: ____________ Start time: ____________ Finish time: ____________ Date: ____________ Movements: ____________ Start time: ____________ Finish time: ____________ Date: ____________ Movements: ____________ Start time: ____________ Finish time: ____________ Date: ____________ Movements: ____________ Start time: ____________ Finish time: ____________ Date: ____________ Movements: ____________ Start time: ____________ Finish time: ____________ Date: ____________ Movements: ____________ Start time: ____________ Finish time: ____________  Date: ____________ Movements: ____________ Start time: ____________ Finish time: ____________ Date: ____________ Movements: ____________ Start time: ____________ Finish time: ____________ Date: ____________ Movements: ____________ Start time: ____________ Finish time: ____________ Date: ____________ Movements: ____________ Start time: ____________ Finish time: ____________ Date: ____________ Movements: ____________ Start time: ____________ Finish time: ____________ Date: ____________ Movements: ____________ Start time: ____________ Finish time: ____________   This information is not intended to replace advice given to you by your health care provider. Make sure you discuss any questions you have with your health care provider.   Document Released: 04/06/2006 Document Revised: 03/28/2014 Document Reviewed: 01/02/2012 Elsevier Interactive Patient Education 2016 [employer] Braxton Hicks Contractions Contractions of the uterus can occur throughout pregnancy. Contractions are not always a sign that you are in labor.  WHAT ARE BRAXTON HICKS CONTRACTIONS?  Contractions that occur before labor are called Braxton Hicks contractions, or false labor. Toward the end of pregnancy (32-34 weeks), these contractions can develop more often and may become more forceful. This is not true labor because these contractions do not result in opening (dilatation) and thinning of  the cervix. They are sometimes difficult to tell apart from true labor because these contractions can be forceful and people have different pain tolerances. You should not feel embarrassed if you go to the hospital with false labor. Sometimes, the only way to tell if you are in true labor is for your health care provider to look for changes in the cervix. If there are no prenatal problems or other health problems associated with the pregnancy, it is completely safe to be sent home with false labor and await the onset of true labor. HOW CAN YOU TELL THE DIFFERENCE BETWEEN TRUE AND FALSE LABOR? False Labor  The contractions of false labor are usually shorter and not as hard as those of true labor.   The contractions are usually irregular.   The contractions are often felt in the front of the lower abdomen and in the groin.   The contractions may go away when you walk around or change positions while lying down.   The contractions get weaker and are shorter lasting as time goes on.   The contractions do not usually become progressively stronger, regular, and closer together as with true labor.  True Labor 5. Contractions in true   labor last 30-70 seconds, become very regular, usually become more intense, and increase in frequency.  6. The contractions do not go away with walking.  7. The discomfort is usually felt in the top of the uterus and spreads to the lower abdomen and low back.  8. True labor can be determined by your health care provider with an exam. This will show that the cervix is dilating and getting thinner.  WHAT TO REMEMBER  Keep up with your usual exercises and follow other instructions given by your health care provider.   Take medicines as directed by your health care provider.   Keep your regular prenatal appointments.   Eat and drink lightly if you think you are going into labor.   If Braxton Hicks contractions are making you uncomfortable:   Change  your position from lying down or resting to walking, or from walking to resting.   Sit and rest in a tub of warm water.   Drink 2-3 glasses of water. Dehydration may cause these contractions.   Do slow and deep breathing several times an hour.  WHEN SHOULD I SEEK IMMEDIATE MEDICAL CARE? Seek immediate medical care if:  Your contractions become stronger, more regular, and closer together.   You have fluid leaking or gushing from your vagina.   You have a fever.   You pass blood-tinged mucus.   You have vaginal bleeding.   You have continuous abdominal pain.   You have low back pain that you never had before.   You feel your baby's head pushing down and causing pelvic pressure.   Your baby is not moving as much as it used to.    This information is not intended to replace advice given to you by your health care provider. Make sure you discuss any questions you have with your health care provider.   Document Released: 03/07/2005 Document Revised: 03/12/2013 Document Reviewed: 12/17/2012 Elsevier Interactive Patient Education 2016 [employer]. Cholelithiasis Cholelithiasis (also called gallstones) is a form of gallbladder disease in which gallstones form in your gallbladder. The gallbladder is an organ that stores bile made in the liver, which helps digest fats. Gallstones begin as small crystals and slowly grow into stones. Gallstone pain occurs when the gallbladder spasms and a gallstone is blocking the duct. Pain can also occur when a stone passes out of the duct.  RISK FACTORS  Being female.   Having multiple pregnancies. Health care providers sometimes advise removing diseased gallbladders before future pregnancies.   Being obese.  Eating a diet heavy in fried foods and fat.   Being older than 60 years and increasing age.   Prolonged use of medicines containing female hormones.   Having diabetes mellitus.   Rapidly losing weight.   Having a  family history of gallstones (heredity).  SYMPTOMS 9. Nausea.  10. Vomiting. 11. Abdominal pain.  12. Yellowing of the skin (jaundice).  13. Sudden pain. It may persist from several minutes to several hours. 14. Fever.  15. Tenderness to the touch. In some cases, when gallstones do not move into the bile duct, people have no pain or symptoms. These are called "silent" gallstones.  TREATMENT Silent gallstones do not need treatment. In severe cases, emergency surgery may be required. Options for treatment include:  Surgery to remove the gallbladder. This is the most common treatment.  Medicines. These do not always work and may take 6-12 months or more to work.  Shock wave treatment (extracorporeal biliary lithotripsy). In this treatment an  ultrasound machine sends shock waves to the gallbladder to break gallstones into smaller pieces that can pass into the intestines or be dissolved by medicine. HOME CARE INSTRUCTIONS   Only take over-the-counter or prescription medicines for pain, discomfort, or fever as directed by your health care provider.   Follow a low-fat diet until seen again by your health care provider. Fat causes the gallbladder to contract, which can result in pain.   Follow up with your health care provider as directed. Attacks are almost always recurrent and surgery is usually required for permanent treatment.  SEEK IMMEDIATE MEDICAL CARE IF:   Your pain increases and is not controlled by medicines.   You have a fever or persistent symptoms for more than 2-3 days.   You have a fever and your symptoms suddenly get worse.   You have persistent nausea and vomiting.  MAKE SURE YOU:   Understand these instructions.  Will watch your condition.  Will get help right away if you are not doing well or get worse.   This information is not intended to replace advice given to you by your health care provider. Make sure you discuss any questions you have with  your health care provider.   Document Released: 03/03/2005 Document Revised: 11/07/2012 Document Reviewed: 08/29/2012 Elsevier Interactive Patient Education [employer]2016 Elsevier Inc[employer]

## 2015-02-19 NOTE — Progress Notes (Signed)
Breastfeeding tip of the week reviewed Cultures today 

## 2015-02-20 LAB — URINE CYTOLOGY ANCILLARY ONLY
CHLAMYDIA, DNA PROBE: NEGATIVE
Neisseria Gonorrhea: NEGATIVE

## 2015-02-21 LAB — CULTURE, BETA STREP (GROUP B ONLY)

## 2015-02-23 NOTE — Telephone Encounter (Signed)
Pt seen on 1/21.

## 2015-02-26 ENCOUNTER — Ambulatory Visit (INDEPENDENT_AMBULATORY_CARE_PROVIDER_SITE_OTHER): Payer: Medicaid Other | Admitting: Certified Nurse Midwife

## 2015-02-26 VITALS — BP 105/54 | HR 105 | Temp 97.9°F | Wt 248.7 lb

## 2015-02-26 DIAGNOSIS — Z3493 Encounter for supervision of normal pregnancy, unspecified, third trimester: Secondary | ICD-10-CM

## 2015-02-26 NOTE — Patient Instructions (Signed)

## 2015-02-26 NOTE — Progress Notes (Signed)
[name] Beverly FalconerCampbell is a 19 y/o G2P0010 at 8228w1d here for ongoing prenatal care. She has a low-risk uncomplicated pregnancy thus far. Hx of Sickle Cell trait, Cholelithiasis without cholecystitis or obstruction, and GBS pos. At this visit No LOF, No VB, Good fetal movement, and she says she has rare braxton hicks contractions 1-2 times / day.   The following portions of the patient's history were reviewed and updated as appropriate: allergies, current medications, past family history, past medical history, past social history, past surgical history and problem list. Problem list updated.  Filed Vitals:   02/26/15 1557  BP: 105/54  Pulse: 105  Temp: 97.9 F (36.6 C)  Fetal Status: HR 162  Gen: AAOx3, NAD HEENt: NCAT, PERRLA CV: RRR Resp: appropriate rate, unlabored.  Abd: Soft, Gravid, appropriate for gestational age, 38cm.  G/U: No vaginal lesions noted. Cervix 0.5cm, vertex, - 3 station, and 40% effaced.  Ext: ROM normal, No edema Psych: appropriate mood / affect.   A/P: Pregnancy G2P0010 at 6628w1d 1. Supervision of normal pregnancy, 3rd trimester.  - GBS pos.  - Reviewed PTL signs / symptoms.  - Cervical exam today is normal.  - Pt. To return in one week for ongoing follow up.  - after visit summary for counseling recommendations.   Beverly Pacealeb Tiquan Bouch, MD PGY 2

## 2015-03-06 LAB — OB RESULTS CONSOLE GBS: GBS: POSITIVE

## 2015-03-09 ENCOUNTER — Ambulatory Visit (INDEPENDENT_AMBULATORY_CARE_PROVIDER_SITE_OTHER): Payer: Medicaid Other | Admitting: Obstetrics & Gynecology

## 2015-03-09 VITALS — BP 114/70 | HR 120 | Wt 250.1 lb

## 2015-03-09 DIAGNOSIS — R8290 Unspecified abnormal findings in urine: Secondary | ICD-10-CM

## 2015-03-09 DIAGNOSIS — Z3493 Encounter for supervision of normal pregnancy, unspecified, third trimester: Secondary | ICD-10-CM

## 2015-03-09 DIAGNOSIS — R8271 Bacteriuria: Secondary | ICD-10-CM

## 2015-03-09 LAB — POCT URINALYSIS DIP (DEVICE)
BILIRUBIN URINE: NEGATIVE
GLUCOSE, UA: NEGATIVE mg/dL
Ketones, ur: NEGATIVE mg/dL
NITRITE: NEGATIVE
Protein, ur: 30 mg/dL — AB
SPECIFIC GRAVITY, URINE: 1.025 (ref 1.005–1.030)
UROBILINOGEN UA: 4 mg/dL — AB (ref 0.0–1.0)
pH: 6.5 (ref 5.0–8.0)

## 2015-03-09 MED ORDER — PRENATAL VITAMINS 0.8 MG PO TABS: 1.0000 | ORAL_TABLET | Freq: Every day | ORAL | Status: DC

## 2015-03-09 NOTE — Progress Notes (Signed)
Subjective:  Beverly Anthony is a 19 y.o. G2P0010 at 4879w5d being seen today for ongoing prenatal care.  She is currently monitored for the following issues for this low-risk pregnancy and has Supervision of normal pregnancy; Sickle cell trait (HCC); Gallbladder stone without cholecystitis or obstruction; and Group B streptococcal bacteriuria on her problem list.  Patient reports no complaints.  Contractions: Not present. Vag. Bleeding: None.  Movement: Present. Denies leaking of fluid.   The following portions of the patient's history were reviewed and updated as appropriate: allergies, current medications, past family history, past medical history, past social history, past surgical history and problem list. Problem list updated.  Objective:   Filed Vitals:   03/09/15 1544  BP: 114/70  Pulse: 120  Weight: 250 lb 1.6 oz (113.445 kg)    Fetal Status: Fetal Heart Rate (bpm): 160 Fundal Height: 39 cm Movement: Present  Presentation: Vertex  General:  Alert, oriented and cooperative. Patient is in no acute distress.  Skin: Skin is warm and dry. No rash noted.   Cardiovascular: Normal heart rate noted  Respiratory: Normal respiratory effort, no problems with respiration noted  Abdomen: Soft, gravid, appropriate for gestational age. Pain/Pressure: Present     Pelvic: Vag. Bleeding: None     Cervical exam performed Dilation: Fingertip Effacement (%): 50 Station: -3  Extremities: Normal range of motion.  Edema: None  Mental Status: Normal mood and affect. Normal behavior. Normal judgment and thought content.   Urinalysis: Urine Protein: 1+ Urine Glucose: Negative  Large LE and trace hemoglobin  Assessment and Plan:  Pregnancy: G2P0010 at 679w5d  1. Supervision of normal pregnancy, third trimester Refills ordered - Prenatal Multivit-Min-Fe-FA (PRENATAL VITAMINS) 0.8 MG tablet; Take 1 tablet by mouth daily.  Dispense: 30 tablet; Refill: 12  2. Abnormal urinalysis - Culture, OB Urine.   Will follow up results and manage accordingly.   Term labor symptoms and general obstetric precautions including but not limited to vaginal bleeding, contractions, leaking of fluid and fetal movement were reviewed in detail with the patient. Please refer to After Visit Summary for other counseling recommendations.  Return in about 1 week (around 03/16/2015) for OB Visit.   Tereso NewcomerUgonna A Anyanwu, MD

## 2015-03-09 NOTE — Progress Notes (Signed)
Needs refill on pnv Large leukocytes and trace hemoglobin in urine.

## 2015-03-09 NOTE — Patient Instructions (Signed)
Return to clinic for any obstetric concerns or go to [facility]U for evaluation  

## 2015-03-12 LAB — CULTURE, OB URINE: Colony Count: 15000

## 2015-03-12 MED ORDER — AMOXICILLIN 500 MG PO CAPS
500.0000 mg | ORAL_CAPSULE | Freq: Three times a day (TID) | ORAL | Status: DC
Start: 2015-03-12 — End: 2015-03-17

## 2015-03-12 NOTE — Addendum Note (Signed)
Addended by: Jaynie CollinsANYANWU, Whitaker Holderman A on: 03/12/2015 03:09 PM   Modules accepted: Orders

## 2015-03-17 ENCOUNTER — Ambulatory Visit (INDEPENDENT_AMBULATORY_CARE_PROVIDER_SITE_OTHER): Payer: Medicaid Other | Admitting: Certified Nurse Midwife

## 2015-03-17 VITALS — BP 110/55 | HR 113 | Temp 98.3°F | Wt 255.0 lb

## 2015-03-17 DIAGNOSIS — Z3483 Encounter for supervision of other normal pregnancy, third trimester: Secondary | ICD-10-CM

## 2015-03-17 LAB — POCT URINALYSIS DIP (DEVICE)
Bilirubin Urine: NEGATIVE
Glucose, UA: NEGATIVE mg/dL
Ketones, ur: NEGATIVE mg/dL
Nitrite: NEGATIVE
Protein, ur: NEGATIVE mg/dL
Specific Gravity, Urine: 1.015 (ref 1.005–1.030)
Urobilinogen, UA: 1 mg/dL (ref 0.0–1.0)
pH: 7 (ref 5.0–8.0)

## 2015-03-17 NOTE — Patient Instructions (Signed)

## 2015-03-17 NOTE — Progress Notes (Signed)
Subjective:  Beverly Anthony is a 19 y.o. G2P0010 at 5140w6d being seen today for ongoing prenatal care.  She is currently monitored for the following issues for this low-risk pregnancy and has Supervision of normal pregnancy; Sickle cell trait (HCC); Gallbladder stone without cholecystitis or obstruction; and Group B streptococcal bacteriuria on her problem list.  Patient reports no complaints.  Contractions: Irritability. Vag. Bleeding: None.  Movement: Present. Denies leaking of fluid.   The following portions of the patient's history were reviewed and updated as appropriate: allergies, current medications, past family history, past medical history, past social history, past surgical history and problem list. Problem list updated.  Objective:   Filed Vitals:   03/17/15 1544  BP: 110/55  Pulse: 113  Temp: 98.3 F (36.8 C)  Weight: 255 lb (115.667 kg)    Fetal Status: Fetal Heart Rate (bpm): 158   Movement: Present     General:  Alert, oriented and cooperative. Patient is in no acute distress.  Skin: Skin is warm and dry. No rash noted.   Cardiovascular: Normal heart rate noted  Respiratory: Normal respiratory effort, no problems with respiration noted  Abdomen: Soft, gravid, appropriate for gestational age. Pain/Pressure: Present     Pelvic: Vag. Bleeding: None     Cervical exam deferred        Extremities: Normal range of motion.  Edema: None  Mental Status: Normal mood and affect. Normal behavior. Normal judgment and thought content.   Urinalysis:      Assessment and Plan:  Pregnancy: G2P0010 at 8840w6d  There are no diagnoses linked to this encounter. Term labor symptoms and general obstetric precautions including but not limited to vaginal bleeding, contractions, leaking of fluid and fetal movement were reviewed in detail with the patient. Please refer to After Visit Summary for other counseling recommendations.  Return in about 6 days (around 03/23/2015) for schedule  nst.   Rhea PinkLori A Avagrace Botelho, CNM

## 2015-03-22 HISTORY — PX: EXTERNAL EAR SURGERY: SHX627

## 2015-03-22 NOTE — L&D Delivery Note (Cosign Needed)
Delivery Note At 11:06 PM a viable female was delivered via Vaginal, Spontaneous Delivery (Presentation: Occiput Anterior).  APGAR: 1, 9; weight: pending .   Placenta status: Intact, Spontaneous.  Cord: 3 vessels with the following complications: None.  Cord pH: pending.  Anesthesia: Epidural Local: 1% lidocaine Episiotomy: None Lacerations: 2nd degree;Perineal Suture Repair: 3.0 vicryl Est. Blood Loss (mL): 500  Mom to postpartum.  Baby to Couplet care / Skin to Skin.  Beverly DoeCaleb Anthony 03/25/2015, 11:53 PM  Patient is a G2P0010 at 2024w0d who was admitted for PD IOL, uncomplicated prenatal course.  She progressed with augmentation via Pitocin.  I was gloved and present for delivery in its entirety.  Second stage of labor progressed to SVD.  Some decels during second stage noted. Dr Despina HiddenEure present as well in room during delivery. Infant briefly dried and stimulated on pt's abd and then cord clamped/cut and infant taken to warmer for eval.  Complications: NICU team called for Code Apgar, but infant to remain in room   Cam HaiSHAW, KIMBERLY, CNM 12:49 AM  03/26/2015

## 2015-03-23 ENCOUNTER — Ambulatory Visit (INDEPENDENT_AMBULATORY_CARE_PROVIDER_SITE_OTHER): Payer: Medicaid Other | Admitting: *Deleted

## 2015-03-23 ENCOUNTER — Ambulatory Visit (HOSPITAL_COMMUNITY)
Admission: RE | Admit: 2015-03-23 | Discharge: 2015-03-23 | Disposition: A | Discharge status: Home / Self Care | Payer: Medicaid Other | Source: Ambulatory Visit | Attending: Family Medicine | Admitting: Family Medicine

## 2015-03-23 VITALS — BP 117/66 | HR 98

## 2015-03-23 DIAGNOSIS — O288 Other abnormal findings on antenatal screening of mother: Secondary | ICD-10-CM

## 2015-03-23 DIAGNOSIS — O480 Post-term pregnancy: Secondary | ICD-10-CM

## 2015-03-23 DIAGNOSIS — Z3A40 40 weeks gestation of pregnancy: Secondary | ICD-10-CM | POA: Insufficient documentation

## 2015-03-23 DIAGNOSIS — O283 Abnormal ultrasonic finding on antenatal screening of mother: Secondary | ICD-10-CM | POA: Insufficient documentation

## 2015-03-23 NOTE — Progress Notes (Signed)
nonreactive NST.  Will send for BPP

## 2015-03-24 ENCOUNTER — Encounter (HOSPITAL_COMMUNITY): Payer: Self-pay | Admitting: *Deleted

## 2015-03-24 ENCOUNTER — Inpatient Hospital Stay (HOSPITAL_COMMUNITY)
Admission: AD | Admit: 2015-03-24 | Discharge: 2015-03-27 | DRG: 774 | Disposition: A | Discharge status: Home / Self Care | Payer: Medicaid Other | Source: Ambulatory Visit | Attending: Family Medicine | Admitting: Family Medicine

## 2015-03-24 DIAGNOSIS — K219 Gastro-esophageal reflux disease without esophagitis: Secondary | ICD-10-CM | POA: Diagnosis present

## 2015-03-24 DIAGNOSIS — Z3A41 41 weeks gestation of pregnancy: Secondary | ICD-10-CM

## 2015-03-24 DIAGNOSIS — Z6841 Body Mass Index (BMI) 40.0 and over, adult: Secondary | ICD-10-CM

## 2015-03-24 DIAGNOSIS — O480 Post-term pregnancy: Principal | ICD-10-CM | POA: Diagnosis present

## 2015-03-24 DIAGNOSIS — O99214 Obesity complicating childbirth: Secondary | ICD-10-CM | POA: Diagnosis present

## 2015-03-24 DIAGNOSIS — Z8249 Family history of ischemic heart disease and other diseases of the circulatory system: Secondary | ICD-10-CM

## 2015-03-24 DIAGNOSIS — Z833 Family history of diabetes mellitus: Secondary | ICD-10-CM

## 2015-03-24 DIAGNOSIS — O99824 Streptococcus B carrier state complicating childbirth: Secondary | ICD-10-CM | POA: Diagnosis present

## 2015-03-24 DIAGNOSIS — O9962 Diseases of the digestive system complicating childbirth: Secondary | ICD-10-CM | POA: Diagnosis present

## 2015-03-24 NOTE — MAU Note (Signed)
Pt c/o contractions every 7 mins for the last 4 hours. Denies LOF or vag bleeding/ +FM. Scheduled for induction tomorrow morning at 0830.

## 2015-03-25 ENCOUNTER — Inpatient Hospital Stay (HOSPITAL_COMMUNITY): Payer: Medicaid Other | Admitting: Anesthesiology

## 2015-03-25 ENCOUNTER — Other Ambulatory Visit: Payer: Medicaid Other

## 2015-03-25 DIAGNOSIS — O9962 Diseases of the digestive system complicating childbirth: Secondary | ICD-10-CM | POA: Diagnosis present

## 2015-03-25 DIAGNOSIS — Z6841 Body Mass Index (BMI) 40.0 and over, adult: Secondary | ICD-10-CM | POA: Diagnosis not present

## 2015-03-25 DIAGNOSIS — O480 Post-term pregnancy: Secondary | ICD-10-CM | POA: Diagnosis present

## 2015-03-25 DIAGNOSIS — O99824 Streptococcus B carrier state complicating childbirth: Secondary | ICD-10-CM | POA: Diagnosis not present

## 2015-03-25 DIAGNOSIS — Z833 Family history of diabetes mellitus: Secondary | ICD-10-CM | POA: Diagnosis not present

## 2015-03-25 DIAGNOSIS — Z3A41 41 weeks gestation of pregnancy: Secondary | ICD-10-CM

## 2015-03-25 DIAGNOSIS — O99214 Obesity complicating childbirth: Secondary | ICD-10-CM | POA: Diagnosis present

## 2015-03-25 DIAGNOSIS — Z8249 Family history of ischemic heart disease and other diseases of the circulatory system: Secondary | ICD-10-CM | POA: Diagnosis not present

## 2015-03-25 DIAGNOSIS — K219 Gastro-esophageal reflux disease without esophagitis: Secondary | ICD-10-CM | POA: Diagnosis present

## 2015-03-25 LAB — CBC
HEMATOCRIT: 31.4 % — AB (ref 36.0–46.0)
HEMOGLOBIN: 10.7 g/dL — AB (ref 12.0–15.0)
MCH: 25.8 pg — AB (ref 26.0–34.0)
MCHC: 34.1 g/dL (ref 30.0–36.0)
MCV: 75.7 fL — AB (ref 78.0–100.0)
Platelets: 264 10*3/uL (ref 150–400)
RBC: 4.15 MIL/uL (ref 3.87–5.11)
RDW: 15 % (ref 11.5–15.5)
WBC: 9.8 10*3/uL (ref 4.0–10.5)

## 2015-03-25 LAB — TYPE AND SCREEN
ABO/RH(D): B POS
Antibody Screen: NEGATIVE

## 2015-03-25 LAB — RPR: RPR: NONREACTIVE

## 2015-03-25 LAB — ABO/RH: ABO/RH(D): B POS

## 2015-03-25 MED ORDER — TERBUTALINE SULFATE 1 MG/ML IJ SOLN
0.2500 mg | Freq: Once | INTRAMUSCULAR | Status: DC | PRN
  Filled 2015-03-25: qty 1

## 2015-03-25 MED ORDER — ACETAMINOPHEN 325 MG PO TABS
650.0000 mg | ORAL_TABLET | ORAL | Status: DC | PRN
  Administered 2015-03-26: 650 mg via ORAL
  Filled 2015-03-25: qty 2

## 2015-03-25 MED ORDER — LACTATED RINGERS IV SOLN
INTRAVENOUS | Status: DC
  Administered 2015-03-25: 17:00:00 via INTRAVENOUS
  Administered 2015-03-25 (×2): 125 mL/h via INTRAVENOUS

## 2015-03-25 MED ORDER — FENTANYL CITRATE (PF) 100 MCG/2ML IJ SOLN
50.0000 ug | INTRAMUSCULAR | Status: DC | PRN
  Administered 2015-03-25: 50 ug via INTRAVENOUS
  Filled 2015-03-25: qty 2

## 2015-03-25 MED ORDER — EPHEDRINE 5 MG/ML INJ
10.0000 mg | INTRAVENOUS | Status: DC | PRN
  Filled 2015-03-25: qty 2

## 2015-03-25 MED ORDER — LIDOCAINE HCL (PF) 1 % IJ SOLN
30.0000 mL | INTRAMUSCULAR | Status: DC | PRN
  Administered 2015-03-25: 30 mL via SUBCUTANEOUS
  Filled 2015-03-25 (×2): qty 30

## 2015-03-25 MED ORDER — FENTANYL 2.5 MCG/ML BUPIVACAINE 1/10 % EPIDURAL INFUSION (WH - ANES): 14.0000 mL/h | INTRAMUSCULAR | Status: DC | PRN

## 2015-03-25 MED ORDER — OXYTOCIN 10 UNIT/ML IJ SOLN
2.5000 [IU]/h | INTRAVENOUS | Status: DC
  Filled 2015-03-25: qty 4

## 2015-03-25 MED ORDER — OXYCODONE-ACETAMINOPHEN 5-325 MG PO TABS: 1.0000 | ORAL_TABLET | ORAL | Status: DC | PRN

## 2015-03-25 MED ORDER — LACTATED RINGERS IV SOLN
500.0000 mL | INTRAVENOUS | Status: DC | PRN
  Administered 2015-03-25: 600 mL via INTRAVENOUS
  Administered 2015-03-25 (×2): 500 mL via INTRAVENOUS

## 2015-03-25 MED ORDER — MISOPROSTOL 25 MCG QUARTER TABLET
25.0000 ug | ORAL_TABLET | ORAL | Status: DC | PRN
  Administered 2015-03-25: 25 ug via VAGINAL
  Filled 2015-03-25: qty 1
  Filled 2015-03-25: qty 0.25

## 2015-03-25 MED ORDER — PENICILLIN G POTASSIUM 5000000 UNITS IJ SOLR
5.0000 10*6.[IU] | Freq: Once | INTRAVENOUS | Status: AC
  Administered 2015-03-25: 5 10*6.[IU] via INTRAVENOUS
  Filled 2015-03-25: qty 5

## 2015-03-25 MED ORDER — OXYCODONE-ACETAMINOPHEN 5-325 MG PO TABS: 2.0000 | ORAL_TABLET | ORAL | Status: DC | PRN

## 2015-03-25 MED ORDER — DIPHENHYDRAMINE HCL 50 MG/ML IJ SOLN: 12.5000 mg | INTRAMUSCULAR | Status: DC | PRN

## 2015-03-25 MED ORDER — METHYLERGONOVINE MALEATE 0.2 MG PO TABS
0.2000 mg | ORAL_TABLET | ORAL | Status: AC
  Administered 2015-03-26 (×5): 0.2 mg via ORAL
  Filled 2015-03-25 (×5): qty 1

## 2015-03-25 MED ORDER — MISOPROSTOL 200 MCG PO TABS
800.0000 ug | ORAL_TABLET | Freq: Once | ORAL | Status: AC
  Administered 2015-03-25: 800 ug via ORAL

## 2015-03-25 MED ORDER — ONDANSETRON HCL 4 MG/2ML IJ SOLN
4.0000 mg | Freq: Four times a day (QID) | INTRAMUSCULAR | Status: DC | PRN
  Administered 2015-03-25: 4 mg via INTRAVENOUS
  Filled 2015-03-25: qty 2

## 2015-03-25 MED ORDER — SODIUM BICARBONATE 8.4 % IV SOLN
INTRAVENOUS | Status: DC | PRN
  Administered 2015-03-25: 3 mL via EPIDURAL
  Administered 2015-03-25: 09:00:00 via EPIDURAL

## 2015-03-25 MED ORDER — FENTANYL 2.5 MCG/ML BUPIVACAINE 1/10 % EPIDURAL INFUSION (WH - ANES)
14.0000 mL/h | INTRAMUSCULAR | Status: DC | PRN
  Administered 2015-03-25: 16 mL/h via EPIDURAL
  Administered 2015-03-25 (×4): 14 mL/h via EPIDURAL
  Filled 2015-03-25 (×4): qty 125

## 2015-03-25 MED ORDER — PENICILLIN G POTASSIUM 5000000 UNITS IJ SOLR
2.5000 10*6.[IU] | INTRAVENOUS | Status: DC
  Administered 2015-03-25 (×4): 2.5 10*6.[IU] via INTRAVENOUS
  Filled 2015-03-25 (×10): qty 2.5

## 2015-03-25 MED ORDER — METHYLERGONOVINE MALEATE 0.2 MG/ML IJ SOLN
INTRAMUSCULAR | Status: AC
  Administered 2015-03-25: 0.2 mg via INTRAMUSCULAR
  Filled 2015-03-25: qty 1

## 2015-03-25 MED ORDER — FLEET ENEMA 7-19 GM/118ML RE ENEM: 1.0000 | ENEMA | RECTAL | Status: DC | PRN

## 2015-03-25 MED ORDER — PHENYLEPHRINE 40 MCG/ML (10ML) SYRINGE FOR IV PUSH (FOR BLOOD PRESSURE SUPPORT)
80.0000 ug | PREFILLED_SYRINGE | INTRAVENOUS | Status: DC | PRN
  Filled 2015-03-25: qty 2
  Filled 2015-03-25 (×2): qty 20

## 2015-03-25 MED ORDER — TERBUTALINE SULFATE 1 MG/ML IJ SOLN
0.2500 mg | Freq: Once | INTRAMUSCULAR | Status: DC | PRN
Start: 2015-03-25 — End: 2015-03-26
  Filled 2015-03-25: qty 1

## 2015-03-25 MED ORDER — OXYTOCIN 10 UNIT/ML IJ SOLN
1.0000 m[IU]/min | INTRAMUSCULAR | Status: DC
  Administered 2015-03-25: 2 m[IU]/min via INTRAVENOUS
  Administered 2015-03-25: 4 m[IU]/min via INTRAVENOUS
  Filled 2015-03-25: qty 4

## 2015-03-25 MED ORDER — LIDOCAINE HCL (PF) 1 % IJ SOLN
INTRAMUSCULAR | Status: DC | PRN
  Administered 2015-03-25 (×2): 4 mL

## 2015-03-25 MED ORDER — OXYTOCIN BOLUS FROM INFUSION
500.0000 mL | INTRAVENOUS | Status: DC
  Administered 2015-03-25: 500 mL via INTRAVENOUS

## 2015-03-25 MED ORDER — CITRIC ACID-SODIUM CITRATE 334-500 MG/5ML PO SOLN
30.0000 mL | ORAL | Status: DC | PRN
  Administered 2015-03-25: 30 mL via ORAL
  Filled 2015-03-25: qty 15

## 2015-03-25 MED ORDER — BUPIVACAINE HCL (PF) 0.25 % IJ SOLN
INTRAMUSCULAR | Status: DC | PRN
  Administered 2015-03-25: 3 mL via EPIDURAL

## 2015-03-25 MED ORDER — MISOPROSTOL 200 MCG PO TABS
ORAL_TABLET | ORAL | Status: AC
  Administered 2015-03-25: 800 ug via ORAL
  Filled 2015-03-25: qty 4

## 2015-03-25 MED ORDER — METHYLERGONOVINE MALEATE 0.2 MG/ML IJ SOLN
0.2000 mg | Freq: Once | INTRAMUSCULAR | Status: AC
  Administered 2015-03-25: 0.2 mg via INTRAMUSCULAR

## 2015-03-25 NOTE — Consult Note (Signed)
Neonatology Note:  Attendance at Code Apgar:   Our team responded to a Code Apgar call to room # 162 following NSVD, due to infant with apnea. The requesting physician was Dr. Debroah LoopArnold. The mother is a G2P0010 at 1247w0d presented for PDIOL, GBS pos with aIAP. ROM occurred 12 hours PTD and the fluid was clear.  At delivery, the baby "stunned" with limited air movement and bradycardia. The nursing staff in attendance gave vigorous stimulation and a Code Apgar was called. OB began PPV with good response.  Our team arrived at 4 minutes of life, at which time the baby was improving.  HR >100 with good breathe sounds bilateral. Sao2 check reassuring. Ap at 5 min was 9.  Overall, transitioning well, now.  I spoke with the parents in the DR, then transferred the baby to the Pediatrician's care.   Jamie Brookesavid Ehrmann, MD

## 2015-03-25 NOTE — Anesthesia Procedure Notes (Signed)
Epidural Patient location during procedure: OB  Staffing Anesthesiologist: Shakiyah Cirilo Performed by: anesthesiologist   Preanesthetic Checklist Completed: patient identified, pre-op evaluation, timeout performed, IV checked, risks and benefits discussed and monitors and equipment checked  Epidural Patient position: sitting Prep: site prepped and draped and DuraPrep Patient monitoring: heart rate Approach: midline Location: L3-L4 Injection technique: LOR air and LOR saline  Needle:  Needle type: Tuohy  Needle gauge: 17 G Needle length: 9 cm Needle insertion depth: 9 cm Catheter type: closed end flexible Catheter size: 19 Gauge Catheter at skin depth: 15 cm Test dose: negative  Assessment Sensory level: T8 Events: blood not aspirated, injection not painful, no injection resistance, negative IV test and no paresthesia  Additional Notes Reason for block:procedure for pain   

## 2015-03-25 NOTE — Progress Notes (Signed)
Labor Progress Note Beverly Anthony is a 20 y.o. G2P0010 at 7236w0d presented for PDIOL S:  Patient notes of some low back pain and vaginal pressure. Does not feel the need to push.   O:  BP 158/94 mmHg  Pulse 83  Temp(Src) 99 F (37.2 C) (Oral)  Resp 18  Ht 5\' 2"  (1.575 m)  Wt 114.76 kg (253 lb)  BMI 46.26 kg/m2  SpO2 97%  LMP 06/11/2014 EFM: boy (outpt circ) Beverly Anthony/breast / Nexplanon  CVE: Dilation: Lip/rim Effacement (%): 90 Cervical Position: Middle Station: -1 Presentation: Vertex Exam by:: Wenda LowAnna Potts, RN  FHT: Basline 150, mod variability, + accelerations, no decelerations.   A&P: 20 y.o. G2P0010 2036w0d presented for PDIOL #Labor: active labor progressing well. S/p Foley and Cytotec @ 0151 03/25/15 #Pain: epidural #FWB: Cat 1 #GBS pos receiving Penicillin   Palma HolterKanishka G Christene Pounds, MD 5:08 PM

## 2015-03-25 NOTE — Progress Notes (Signed)
Dr. Sherron AlesK. Jackson notified of chest pain in patient at 1135 and instructed to turn off epidural pump. Pump turned off at 1135 and Dr. Jean RosenthalJackson to bedside at 1139 and he resumed pump functions after assessing level.

## 2015-03-25 NOTE — Progress Notes (Signed)
Epidural redosed 

## 2015-03-25 NOTE — Anesthesia Preprocedure Evaluation (Signed)
Anesthesia Evaluation  Patient identified by MRN, date of birth, ID band Patient awake    Reviewed: Allergy & Precautions, NPO status , Patient's Chart, lab work & pertinent test results  Airway Mallampati: II  TM Distance: >3 FB Neck ROM: Full    Dental no notable dental hx.    Pulmonary neg pulmonary ROS,    Pulmonary exam normal breath sounds clear to auscultation       Cardiovascular negative cardio ROS Normal cardiovascular exam Rhythm:Regular Rate:Normal     Neuro/Psych negative neurological ROS  negative psych ROS   GI/Hepatic negative GI ROS, Neg liver ROS,   Endo/Other  Morbid obesity  Renal/GU negative Renal ROS     Musculoskeletal negative musculoskeletal ROS (+)   Abdominal   Peds  Hematology negative hematology ROS (+)   Anesthesia Other Findings   Reproductive/Obstetrics (+) Pregnancy                             Anesthesia Physical Anesthesia Plan  ASA: III  Anesthesia Plan: Epidural   Post-op Pain Management:    Induction:   Airway Management Planned:   Additional Equipment:   Intra-op Plan:   Post-operative Plan:   Informed Consent: I have reviewed the patients History and Physical, chart, labs and discussed the procedure including the risks, benefits and alternatives for the proposed anesthesia with the patient or authorized representative who has indicated his/her understanding and acceptance.     Plan Discussed with:   Anesthesia Plan Comments:         Anesthesia Quick Evaluation

## 2015-03-25 NOTE — Progress Notes (Signed)
With chest pain, patient denies shortness of breath. She described pain as sharp more with movement.

## 2015-03-25 NOTE — H&P (Signed)
[name] Beverly Anthony is a 20 y.o. female G2P0010 with IUP at 8931w0d presenting for contractions and IOL 2/2 postdates. Pt states she has been having occasional contractions, associated with none vaginal bleeding.  Membranes are intact, with active fetal movement.   [facility] at low risk clinic since 31 wks. (Previously had prenatal care in [place] since 14 weeks)  Prenatal History/Complications:  Past Medical History: Past Medical History  Diagnosis Date  . Medical history non-contributory     Past Surgical History: Past Surgical History  Procedure Laterality Date  . No past surgeries      Obstetrical History: OB History    Gravida Para Term Preterm AB TAB SAB Ectopic Multiple Living   2 0 0 0 1 0 1 0 0 0       Obstetric Comments   03/12/14 SAB 12 wks      Gynecological History: OB History    Gravida Para Term Preterm AB TAB SAB Ectopic Multiple Living   2 0 0 0 1 0 1 0 0 0       Obstetric Comments   03/12/14 SAB 12 wks      Social History: Social History   Social History  . Marital Status: Unknown    Spouse Name: N/A  . Number of Children: N/A  . Years of Education: N/A   Social History Main Topics  . Smoking status: Never Smoker   . Smokeless tobacco: Never Used  . Alcohol Use: No  . Drug Use: No  . Sexual Activity: Yes   Other Topics Concern  . None   Social History Narrative    Family History: Family History  Problem Relation Age of Onset  . Cancer Paternal Grandfather   . Diabetes Father   . Hypertension Father     Allergies: No Known Allergies  Prescriptions prior to admission  Medication Sig Dispense Refill Last Dose  . Prenatal Multivit-Min-Fe-FA (PRENATAL VITAMINS) 0.8 MG tablet Take 1 tablet by mouth daily. 30 tablet 12 03/23/2015 at Unknown time     Review of Systems   Constitutional: No fevers or chills  Blood pressure 132/67, pulse 91, temperature 98.4 F (36.9 C), temperature source Oral, resp. rate 18, height 5\' 3"  (1.6 m), weight  115.032 kg (253 lb 9.6 oz), last menstrual period 06/11/2014, SpO2 99 %. General appearance: alert, cooperative, appears stated age and no distress Lungs: clear to auscultation bilaterally Heart: regular rate and rhythm Abdomen: soft, non-tender; bowel sounds normal Pelvic: Adequate Extremities: Homans sign is negative, no sign of DVT DTR's normal Presentation: cephalic Fetal monitoringBaseline: 145 bpm, Variability: Good {> 6 bpm), Accelerations: present and Decelerations: Absent Uterine activity: Occasional contraction  Dilation: 2 Effacement (%): 50 Station: -2 Exam by:: Duncan DullWeston,RN   Prenatal labs: ABO, Rh: B/Positive/-- (06/29 0000) Antibody: Negative (06/29 0000) Rubella: !Error! RPR: NON REAC (11/01 0930)  HBsAg: Negative (06/29 0000)  HIV: NONREACTIVE (11/01 0930)  GBS:    1 hr Glucola 86 Genetic screening  Quad negative Anatomy US Normal   Prenatal Transfer Tool  Maternal Diabetes: No Genetic Screening: Normal Maternal Ultrasounds/Referrals: Normal Fetal Ultrasounds or other Referrals:  None Maternal Substance Abuse:  No Significant Maternal Medications:  None Significant Maternal Lab Results: Lab values include: Group B Strep positive  Assessment: [name] Beverly Anthony is a 20 y.o. G2P0010 at 2431w0d here for IOL secondary to postdates.  #Labor: IOL. Will place foley bulb and start cytotect #Pain: Fentanyl prn, Epidural on request #FWB: Cat 1 #ID:  GBS positive. Will start PCN prophylaxis #MOF: Breast #  MOC: Nexplanon  Beverly Anthony 03/25/2015, 12:35 AM    OB FELLOW HISTORY AND PHYSICAL ATTESTATION  I have seen and examined this patient; I agree with above documentation in the resident's note.    Beverly Anthony 03/25/2015, 4:01 AM

## 2015-03-25 NOTE — Progress Notes (Signed)
Labor Progress Note Beverly Anthony is a 20 y.o. G2P0010 at 3158w0d presented for PDIOL S:  Nursing called noting that patient complained of severe chest pain. On evaluation, patient noted that chest pain was central and began after receiving a bolus dose of PCA. States pain does not travel, worsens with shifting position. States chest pain is intermittent. She states she currently has "residual" chest pain. Denies sensation of chest pressure or shortness of breath. States that she had significant reflux during this pregnancy.   Additionally, patient's vitals are stable and pulse ox 97% on room air.   O:  BP 134/77 mmHg  Pulse 99  Temp(Src) 99.2 F (37.3 C) (Oral)  Resp 20  Ht 5\' 2"  (1.575 m)  Wt 253 lb (114.76 kg)  BMI 46.26 kg/m2  SpO2 97%  LMP 06/11/2014 Cardiac: RRR, no m/r/g Chest: tenderness to palpation of central chest wall, lungs CTAB without crackles LE: Patient currently has no sensation of LE due to epidural. No edema noted. No palpable cord in lower extremities   FHT: FHR 140, mod variablity; + acceleration   CVE: Dilation: 6-6.5 Effacement (%): 80 Station: -1 Presentation: Vertex Exam by: Dr. Karyl KinnierKim Anthony   A&P: 20 y.o. G2P0010 6158w0d currently in active phase of labor. Chest Pain likely MSK vs GERD due to tenderness with palpation of chest wall. ACS considered but unlikley as patient is young and without significant PMH. PE/Amniotic Fluid Embolism considered however unlikely as HR is wnl and O2 sats have been wnl. Chest pain currently improving and only mild with shifting position. Will continue to monitor.  #Labor: currently in active phase; AROM completed by Dr. Alvester MorinNewton, Insertion of Intrauterine pressure cath #Pain: epidural  #FWB: category I   #GBS: pos- PCN   Beverly HolterKanishka G Xyler Terpening, MD 11:53 AM

## 2015-03-26 ENCOUNTER — Encounter (HOSPITAL_COMMUNITY): Payer: Self-pay

## 2015-03-26 LAB — URINE MICROSCOPIC-ADD ON: BACTERIA UA: NONE SEEN

## 2015-03-26 LAB — URINALYSIS, ROUTINE W REFLEX MICROSCOPIC
Bilirubin Urine: NEGATIVE
Glucose, UA: NEGATIVE mg/dL
Ketones, ur: NEGATIVE mg/dL
LEUKOCYTES UA: NEGATIVE
NITRITE: NEGATIVE
PROTEIN: NEGATIVE mg/dL
pH: 6 (ref 5.0–8.0)

## 2015-03-26 LAB — CBC
HEMATOCRIT: 28.6 % — AB (ref 36.0–46.0)
HEMOGLOBIN: 9.6 g/dL — AB (ref 12.0–15.0)
MCH: 25.6 pg — ABNORMAL LOW (ref 26.0–34.0)
MCHC: 33.6 g/dL (ref 30.0–36.0)
MCV: 76.3 fL — ABNORMAL LOW (ref 78.0–100.0)
Platelets: 255 10*3/uL (ref 150–400)
RBC: 3.75 MIL/uL — AB (ref 3.87–5.11)
RDW: 15.2 % (ref 11.5–15.5)
WBC: 24 10*3/uL — AB (ref 4.0–10.5)

## 2015-03-26 LAB — CCBB MATERNAL DONOR DRAW

## 2015-03-26 MED ORDER — DIPHENHYDRAMINE HCL 25 MG PO CAPS: 25.0000 mg | ORAL_CAPSULE | Freq: Four times a day (QID) | ORAL | Status: DC | PRN

## 2015-03-26 MED ORDER — LANOLIN HYDROUS EX OINT: TOPICAL_OINTMENT | CUTANEOUS | Status: DC | PRN

## 2015-03-26 MED ORDER — ACETAMINOPHEN 325 MG PO TABS
650.0000 mg | ORAL_TABLET | ORAL | Status: DC | PRN
  Filled 2015-03-26: qty 2

## 2015-03-26 MED ORDER — DIBUCAINE 1 % RE OINT: 1.0000 "application " | TOPICAL_OINTMENT | RECTAL | Status: DC | PRN

## 2015-03-26 MED ORDER — ONDANSETRON HCL 4 MG PO TABS: 4.0000 mg | ORAL_TABLET | ORAL | Status: DC | PRN

## 2015-03-26 MED ORDER — BENZOCAINE-MENTHOL 20-0.5 % EX AERO
1.0000 "application " | INHALATION_SPRAY | CUTANEOUS | Status: DC | PRN
  Administered 2015-03-26: 1 via TOPICAL
  Filled 2015-03-26: qty 56

## 2015-03-26 MED ORDER — IBUPROFEN 600 MG PO TABS
600.0000 mg | ORAL_TABLET | Freq: Four times a day (QID) | ORAL | Status: DC
  Administered 2015-03-26 – 2015-03-27 (×7): 600 mg via ORAL
  Filled 2015-03-26 (×8): qty 1

## 2015-03-26 MED ORDER — SENNOSIDES-DOCUSATE SODIUM 8.6-50 MG PO TABS
2.0000 | ORAL_TABLET | ORAL | Status: DC
  Administered 2015-03-26 – 2015-03-27 (×2): 2 via ORAL
  Filled 2015-03-26 (×2): qty 2

## 2015-03-26 MED ORDER — SIMETHICONE 80 MG PO CHEW: 80.0000 mg | CHEWABLE_TABLET | ORAL | Status: DC | PRN

## 2015-03-26 MED ORDER — TETANUS-DIPHTH-ACELL PERTUSSIS 5-2.5-18.5 LF-MCG/0.5 IM SUSP: 0.5000 mL | Freq: Once | INTRAMUSCULAR | Status: DC

## 2015-03-26 MED ORDER — ZOLPIDEM TARTRATE 5 MG PO TABS: 5.0000 mg | ORAL_TABLET | Freq: Every evening | ORAL | Status: DC | PRN

## 2015-03-26 MED ORDER — WITCH HAZEL-GLYCERIN EX PADS: 1.0000 "application " | MEDICATED_PAD | CUTANEOUS | Status: DC | PRN

## 2015-03-26 MED ORDER — PRENATAL MULTIVITAMIN CH
1.0000 | ORAL_TABLET | Freq: Every day | ORAL | Status: DC
  Administered 2015-03-26 – 2015-03-27 (×2): 1 via ORAL
  Filled 2015-03-26 (×2): qty 1

## 2015-03-26 MED ORDER — ONDANSETRON HCL 4 MG/2ML IJ SOLN
4.0000 mg | INTRAMUSCULAR | Status: DC | PRN
Start: 2015-03-26 — End: 2015-03-27

## 2015-03-26 NOTE — Discharge Summary (Signed)
OB Discharge Summary     Patient Name: Beverly Anthony DOB: [birth year] MRN: [id number]  Date of admission: 03/24/2015 Delivering MD: Cam HaiSHAW, KIMBERLY D   Date of discharge: 03/27/2015  Admitting diagnosis: 40.6w ctx, labor Intrauterine pregnancy: 6162w0d     Secondary diagnosis:  Active Problems:   Post term pregnancy  Additional problems: none     Discharge diagnosis: Term Pregnancy Delivered                                                                                                Post partum procedures: methergine PO x 24 hours d/t boggy uterus; had isolated temp of 102 ppd#1, normal since then and no sx of infection.   Augmentation: Pitocin, Cytotec and Foley Balloon  Complications: None  Hospital course:  Onset of Labor With Vaginal Delivery     20 y.o. yo G2P1011 at 3662w0d was admitted in Latent Labor on 03/24/2015. Patient had an uncomplicated labor course as follows:  Membrane Rupture Time/Date: 11:40 AM ,03/25/2015   Intrapartum Procedures: Episiotomy: None [1]                                         Lacerations:  2nd degree [3];Perineal [11]  Patient had a delivery of a Viable infant. 03/25/2015  Information for the patient's newborn:  Beverly Anthony, Boy [place] [[id number]][[id number]]  Delivery Method: Vaginal, Spontaneous Delivery (Filed from Delivery Summary)     Pateint had an uncomplicated postpartum course.  She is ambulating, tolerating a regular diet, passing flatus, and urinating well. Patient is discharged home in stable condition on 03/27/2015.    Physical exam  Filed Vitals:   03/26/15 1300 03/26/15 1715 03/26/15 1851 03/27/15 0513  BP:  121/76 124/68 126/53  Pulse:  81 81 69  Temp: 97.6 F (36.4 C) 97.5 F (36.4 C) 97.4 F (36.3 C) 97.4 F (36.3 C)  TempSrc: Oral Oral Oral   Resp:  19 16 18   Height:      Weight:      SpO2:       General: alert, cooperative and no distress Lochia: appropriate Uterine Fundus: firm Incision: N/A DVT Evaluation: No evidence of  DVT seen on physical exam. Labs: Lab Results  Component Value Date   WBC 24.0* 03/26/2015   HGB 9.6* 03/26/2015   HCT 28.6* 03/26/2015   MCV 76.3* 03/26/2015   PLT 255 03/26/2015   CMP Latest Ref Rng 02/09/2015  Glucose 65 - 99 mg/dL 621(H113(H)  BUN 6 - 20 mg/dL 5(L)  Creatinine 0.860.44 - 1.00 mg/dL 5.780.56  Sodium 469135 - 629145 mmol/L 135  Potassium 3.5 - 5.1 mmol/L 3.7  Chloride 101 - 111 mmol/L 103  CO2 22 - 32 mmol/L 24  Calcium 8.9 - 10.3 mg/dL 9.2  Total Protein 6.5 - 8.1 g/dL 7.1  Total Bilirubin 0.3 - 1.2 mg/dL 0.5  Alkaline Phos 38 - 126 U/L 122  AST 15 - 41 U/L 19  ALT 14 - 54 U/L 16    Discharge instruction:  per After Visit Summary and "Baby and Me Booklet".  After visit meds:    Medication List    TAKE these medications        ibuprofen 600 MG tablet  Commonly known as:  ADVIL,MOTRIN  Take 1 tablet (600 mg total) by mouth every 6 (six) hours.     Prenatal Vitamins 0.8 MG tablet  Take 1 tablet by mouth daily.        Diet: routine diet  Activity: Advance as tolerated. Pelvic rest for 6 weeks.   Outpatient follow up:4 weeks Follow up Appt: Future Appointments Date Time Provider Department Center  04/29/2015 1:45 PM Willodean Rosenthal, MD [facility]   Follow up Visit:No Follow-up on file.  Postpartum contraception: Nexplanon  Newborn Data: Live born female  Birth Weight: 7 lb 3 oz (3260 g) APGAR: 1, 9  Baby Feeding: Breast Disposition:home with mother; staying an extra day or so for phototherapy   03/27/2015 CRESENZO-DISHMAN,Otilio Groleau, CNM

## 2015-03-26 NOTE — Progress Notes (Signed)
UR chart review completed.  

## 2015-03-26 NOTE — Progress Notes (Addendum)
Post Partum Day 1 Subjective:  Beverly Anthony is a 20 y.o. G2P1011 7434w0d s/p SVD.  No acute events overnight.  Pt denies problems with ambulating, voiding or po intake.  She denies nausea or vomiting.  Pain is well controlled.  She has had flatus. She has had bowel movement.  Lochia Small.  Plan for birth control is nexplanon.  Method of Feeding: breast  Objective: Blood pressure 112/80, pulse 89, temperature 98.2 F (36.8 C), temperature source Oral, resp. rate 20, height 5\' 2"  (1.575 m), weight 114.76 kg (253 lb), last menstrual period 06/11/2014, SpO2 99 %, unknown if currently breastfeeding.  Physical Exam:  General: alert, cooperative and no distress Lochia:normal flow Chest: CTAB Heart: RRR no m/r/g Abdomen: +BS, soft, nontender,  Uterine Fundus: firm, below umblicus DVT Evaluation: No evidence of DVT seen on physical exam. Extremities: No edema   Recent Labs  03/25/15 0100 03/26/15 0540  HGB 10.7* 9.6*  HCT 31.4* 28.6*    Assessment/Plan:  ASSESSMENT: Beverly Anthony is a 20 y.o. G2P1011 2034w0d s/p SVD  Continue routine postpartum care Elevated BP - noted postpartum, now down trending. Continue to monitor Postpartum bleeding - Hgb stable, bleeding slowing down. Continue methergine Plan for discharge tomorrow   LOS: 1 day   Jacquiline DoeCaleb Parker 03/26/2015, 7:55 AM   CNM attestation Post Partum Day #1 I have seen and examined this patient and agree with above documentation in the resident's note.   Beverly Anthony is a 20 y.o. G2P1011 s/p SVD.  Pt denies problems with ambulating, voiding or po intake. Pain is well controlled.  Plan for birth control is outpt Nexplanon.  Method of Feeding: breast  PE:  BP 112/80 mmHg  Pulse 89  Temp(Src) 98.2 F (36.8 C) (Oral)  Resp 20  Ht 5\' 2"  (1.575 m)  Wt 114.76 kg (253 lb)  BMI 46.26 kg/m2  SpO2 99%  LMP 06/11/2014  Breastfeeding? Unknown Fundus firm  Plan for discharge: 03/27/15  Cam HaiSHAW, KIMBERLY, CNM 9:03 AM   03/26/2015

## 2015-03-26 NOTE — Lactation Note (Signed)
This note was copied from the chart of Boy [name]. Lactation Consultation Note  Patient Name: Boy [name] Today's Date: 03/26/2015 Reason for consult: Initial assessment  Baby 19 hours old , and has been sluggish with feeding requiring finger feeding.  LC visited mom at 1500 and baby in the nursery under the heat shield due to low temp  and mom feeling sleepy. LC was called when baby brought back to the room.  MBU RN assisted mom with a latch and reported swallows. LC observed that feeding latch.  Baby released and noted to be sleepy , no LC burped baby and woke baby up, LC reviewed  Hand expressing and relatched baby with tea cup hold and baby sustained latch for 12 mins with multiply swallows  , increased with  Breast compressions. Per mom comfortable with latch and both mom and dad seemed excited baby was feeding so well.  During the consult and watching the feeding LC reviewed basics of latching and breast feeding.  LC encouraged mom to continue hand expressing before feeding and in between , save the milk and the EBM can be spoon fed back or syringe fed  For extra calories.     Maternal Data Has patient been taught Hand Expression?: Yes (steady flow of colotrum noted ) Does the patient have breastfeeding experience prior to this delivery?: No  Feeding Feeding Type: Breast Fed Length of feed: 12 min (muliply swallows , increased with breast compressions )  LATCH Score/Interventions Latch: Grasps breast easily, tongue down, lips flanged, rhythmical sucking.  Audible Swallowing: Spontaneous and intermittent Intervention(s): Skin to skin;Hand expression  Type of Nipple: Everted at rest and after stimulation  Comfort (Breast/Nipple): Soft / non-tender     Hold (Positioning): Assistance needed to correctly position infant at breast and maintain latch. (worked on depth ) Intervention(s): Breastfeeding basics reviewed;Support Pillows;Position options;Skin to  skin  LATCH Score: 9  Lactation Tools Discussed/Used WIC Program: No (per mom nees to sign up )   Consult Status Consult Status: Follow-up Date: 03/27/15 Follow-up type: In-patient    [name], Carrington Olazabal Ann 03/26/2015, 6:57 PM

## 2015-03-26 NOTE — Anesthesia Postprocedure Evaluation (Signed)
Anesthesia Post Note  Patient: [name]  Procedure(s) Performed: * No procedures listed *  Patient location during evaluation: Mother Baby Anesthesia Type: Epidural Level of consciousness: awake, awake and alert, oriented and patient cooperative Pain management: pain level controlled Vital Signs Assessment: post-procedure vital signs reviewed and stable Respiratory status: spontaneous breathing, nonlabored ventilation and respiratory function stable Cardiovascular status: stable Postop Assessment: no headache, no backache, patient able to bend at knees and no signs of nausea or vomiting Anesthetic complications: no    Last Vitals:  Filed Vitals:   03/26/15 0550 03/26/15 0749  BP:  112/80  Pulse:  89  Temp: 37.2 C 36.8 C  Resp:  20    Last Pain:  Filed Vitals:   03/26/15 0750  PainSc: 0-No pain                 Melva Faux L

## 2015-03-26 NOTE — Progress Notes (Signed)
Dr Jacquiline Doealeb Parker notified of patients systolic B/P and Methergine dosing. Methergine dosing to continue for 24 hours.

## 2015-03-26 NOTE — Discharge Instructions (Signed)

## 2015-03-27 MED ORDER — IBUPROFEN 600 MG PO TABS
600.0000 mg | ORAL_TABLET | Freq: Four times a day (QID) | ORAL | Status: DC
Start: 2015-03-27 — End: 2016-08-16

## 2015-03-27 NOTE — Lactation Note (Signed)
This note was copied from the chart of Beverly Anthony. Mother needs assist with every latch  Not sure how to hold breast   Lactation called to see pt

## 2015-03-27 NOTE — Progress Notes (Signed)
Discharge education complete, discharge instructions and follow up appointment discussed. Patient verbalized understanding. 

## 2015-03-27 NOTE — Lactation Note (Signed)
This note was copied from the chart of Beverly Anthony. Lactation Consultation Note  Patient Name: Beverly Anthony ZOXWR'UToday's Date: 03/27/2015 Reason for consult: Follow-up assessment  Baby is 1939 hours old , and was placed on double photo tx today.  11-7 parents requested formula in a bottle per [facility]'s progress note.  Per mom breast feed this am and then a bottle.  Presently mom has family company and is requesting a bottle with formula  And declined LC assist to latch at the breast. Mom mentioned she still desires to breast feed.  LC discussed with mom adding post  pumping with a DEBP  To enhance milk coming in quicker.  Also LC mentioned to mom colostrum cleans to gut out quicker than formula, but for now colostrum is needed  For hydration.  MBU RN aware to set up a DEBP for post pumping today when mom is freed up form company .     Maternal Data    Feeding Feeding Type: Formula (per moms request ) Nipple Type: Slow - flow  LATCH Score/Interventions Latch:  (per mom for this feeding wants to bottle feed , but still wants to breast feed )              Intervention(s): Breastfeeding basics reviewed     Lactation Tools Discussed/Used Tools: Pump (needs to be set up after company leaves , MBU RN aware - IT consultantHeather Gaither RN ) Breast pump type: Double-Electric Breast Pump   Consult Status Consult Status: Follow-up Date: 03/28/15 Follow-up type: In-patient    [name], [name] 03/27/2015, 2:16 PM

## 2015-03-28 ENCOUNTER — Ambulatory Visit: Payer: Self-pay

## 2015-03-28 NOTE — Lactation Note (Signed)
This note was copied from the chart of Beverly Anthony. Lactation Consultation Note  Patient Name: Beverly Anthony AVWUJ'WToday's Date: 03/28/2015 Reason for consult: Follow-up assessment;Hyperbilirubinemia   Follow up with mom and 65 hour old infant. Infant on double phototherapy. Infant with 3 BF for 15-30 minutes, 1 bottle EBM of 10 cc, 5 bottles of Formula 15-35 cc, 3 voids and 0 stools in last 24 hours. Infant did stool while I was in the room. He has had 8 stools since birth. Infant weight 6 lb 12.6 oz with a 6% weight loss since birth. Mom report she pumped yesterday x 1, she has not pumped today. Infant had just fed a bottle and was fussy, encouraged mom to place to breast. Assisted mom in latching infant to left breast in football hold while on phototherapy. Infant latched easily with flanged lips. Rhythmic swallows and intermittent swallows. Mom denies nipple pain, she does report mild uterine cramping with feeding. Infant nursed for 10 minutes and was still nursing when I left the room. Enc mom to BF prior to bottle feeding every 2-3 hours at first feeding cues. Mom reports she knows that infant needs at least 35 ml per feeding. Enc mom to call with questions/concerns. Updated Gaylyn RongKris on infant's status.   Maternal Data Formula Feeding for Exclusion: No Does the patient have breastfeeding experience prior to this delivery?: No  Feeding Feeding Type: Breast Fed Nipple Type: Slow - flow Length of feed: 10 min  LATCH Score/Interventions Latch: Grasps breast easily, tongue down, lips flanged, rhythmical sucking.  Audible Swallowing: Spontaneous and intermittent  Type of Nipple: Everted at rest and after stimulation  Comfort (Breast/Nipple): Soft / non-tender     Hold (Positioning): Assistance needed to correctly position infant at breast and maintain latch. Intervention(s): Breastfeeding basics reviewed;Support Pillows;Position options;Skin to skin  LATCH Score: 9  Lactation  Tools Discussed/Used Tools: Pump Breast pump type: Double-Electric Breast Pump Pump Review: Setup, frequency, and cleaning;Milk Storage   Consult Status Consult Status: Follow-up Date: 03/29/15 Follow-up type: In-patient    Beverly Anthony 03/28/2015, 4:19 PM

## 2015-03-29 ENCOUNTER — Ambulatory Visit: Payer: Self-pay

## 2015-03-29 NOTE — Lactation Note (Signed)
This note was copied from the chart of Boy [name]. Lactation Consultation Note  Patient Name: Boy [name] ONGEX'BToday's Date: 03/29/2015 Reason for consult: Follow-up assessment;Other (Comment) ([facility] tx D/C today, [facility] )  Baby is 188 hours old, 2% weight loss, Per mom has been breast feeding some and bottle feeding Formula and expressed milk, also pumping.  Per mom baby has been taking 40 ml for a feeding.  LC reviewed basics - protecting and establishing milk supply , working on the latching , if difficult due to baby being use to the  Bottle to try giving baby an appetizer of EBM from a bottle and then try to latch. If baby doesn't latch - finish feeding with a bottle  And pump.  Per mom hasn't signed up with WIC yet and plans to call them or go over to see them on Monday.  LC offered a 2 week rental and per mom funds not available until dad gets paid.  Per mom plans to use the hand pump, and LC showed her how to use the DEBP set up manually.  Also [facility].  Sore nipple and engorgement prevention and tx reviewed.  Mother informed of post-discharge support and given phone number to the lactation department, including services for phone call assistance; out-patient appointments; and breastfeeding support group. List of other breastfeeding resources in the community given in the handout. Encouraged mother to call for problems or concerns related to breastfeeding.   Maternal Data    Feeding Feeding Type: Formula Nipple Type: Slow - flow  LATCH Score/Interventions                Intervention(s): Breastfeeding basics reviewed     Lactation Tools Discussed/Used Tools: Pump (see LC note ) Breast pump type: Double-Electric Breast Pump WIC Program: No ([facility] or [facility] tomorrow , [facility] )   Consult Status Consult Status: Complete Date: 03/29/15    [name], Amalya Salmons Ann 03/29/2015, 4:08  PM

## 2015-04-29 ENCOUNTER — Ambulatory Visit: Payer: Medicaid Other | Admitting: Obstetrics & Gynecology

## 2016-02-08 LAB — OB RESULTS CONSOLE RPR: RPR: NONREACTIVE

## 2016-02-08 LAB — OB RESULTS CONSOLE ANTIBODY SCREEN: Antibody Screen: NEGATIVE

## 2016-02-08 LAB — OB RESULTS CONSOLE RUBELLA ANTIBODY, IGM: Rubella: IMMUNE

## 2016-02-08 LAB — OB RESULTS CONSOLE ABO/RH: RH TYPE: POSITIVE

## 2016-02-08 LAB — OB RESULTS CONSOLE GC/CHLAMYDIA
CHLAMYDIA, DNA PROBE: NEGATIVE
GC PROBE AMP, GENITAL: NEGATIVE

## 2016-02-08 LAB — OB RESULTS CONSOLE HEPATITIS B SURFACE ANTIGEN: Hepatitis B Surface Ag: NEGATIVE

## 2016-02-08 LAB — OB RESULTS CONSOLE HIV ANTIBODY (ROUTINE TESTING): HIV: NONREACTIVE

## 2016-03-21 NOTE — L&D Delivery Note (Signed)
Delivery Note  Patient presented in spontaneous labor. Augmentation w/ AROM and pitocin.  At 12:21 PM a viable female was delivered via  (Presentation: OA  ).  APGAR: pending ; weight pending  .   Placenta status: intact.  Cord:  3v. with the following complications: none.  Cord pH: not obtained  Anesthesia:  epidural Episiotomy:  n/a Lacerations:  Hemostatic periurethral  Suture Repair: n/a Est. Blood Loss (mL):  250  Mom to postpartum.  Baby to Couplet care / Skin to Skin.  Beverly Anthony 08/16/2016, 12:32 PM

## 2016-06-14 DIAGNOSIS — O98513 Other viral diseases complicating pregnancy, third trimester: Secondary | ICD-10-CM | POA: Insufficient documentation

## 2016-07-18 LAB — OB RESULTS CONSOLE GBS: STREP GROUP B AG: POSITIVE

## 2016-07-18 LAB — OB RESULTS CONSOLE GC/CHLAMYDIA
CHLAMYDIA, DNA PROBE: NEGATIVE
Gonorrhea: NEGATIVE

## 2016-08-12 ENCOUNTER — Encounter (HOSPITAL_COMMUNITY): Payer: Self-pay | Admitting: *Deleted

## 2016-08-12 ENCOUNTER — Telehealth (HOSPITAL_COMMUNITY): Payer: Self-pay | Admitting: *Deleted

## 2016-08-15 ENCOUNTER — Encounter (HOSPITAL_COMMUNITY): Payer: Self-pay

## 2016-08-15 ENCOUNTER — Inpatient Hospital Stay (HOSPITAL_COMMUNITY)
Admission: AD | Admit: 2016-08-15 | Discharge: 2016-08-18 | DRG: 775 | Disposition: A | Discharge status: Home / Self Care | Payer: Medicaid Other | Source: Ambulatory Visit | Attending: Family Medicine | Admitting: Family Medicine

## 2016-08-15 DIAGNOSIS — Z68.41 Body mass index (BMI) pediatric, 85th percentile to less than 95th percentile for age: Secondary | ICD-10-CM

## 2016-08-15 DIAGNOSIS — O99824 Streptococcus B carrier state complicating childbirth: Secondary | ICD-10-CM | POA: Diagnosis present

## 2016-08-15 DIAGNOSIS — O9902 Anemia complicating childbirth: Secondary | ICD-10-CM | POA: Diagnosis present

## 2016-08-15 DIAGNOSIS — O99214 Obesity complicating childbirth: Secondary | ICD-10-CM | POA: Diagnosis present

## 2016-08-15 DIAGNOSIS — Z3A40 40 weeks gestation of pregnancy: Secondary | ICD-10-CM

## 2016-08-15 DIAGNOSIS — D573 Sickle-cell trait: Secondary | ICD-10-CM | POA: Diagnosis present

## 2016-08-15 DIAGNOSIS — O480 Post-term pregnancy: Principal | ICD-10-CM | POA: Diagnosis present

## 2016-08-15 LAB — CBC
HCT: 33.2 % — ABNORMAL LOW (ref 36.0–46.0)
HEMOGLOBIN: 10.9 g/dL — AB (ref 12.0–15.0)
MCH: 24.5 pg — AB (ref 26.0–34.0)
MCHC: 32.8 g/dL (ref 30.0–36.0)
MCV: 74.8 fL — AB (ref 78.0–100.0)
Platelets: 203 10*3/uL (ref 150–400)
RBC: 4.44 MIL/uL (ref 3.87–5.11)
RDW: 16.3 % — ABNORMAL HIGH (ref 11.5–15.5)
WBC: 13.9 10*3/uL — ABNORMAL HIGH (ref 4.0–10.5)

## 2016-08-15 LAB — TYPE AND SCREEN
ABO/RH(D): B POS
Antibody Screen: NEGATIVE

## 2016-08-15 MED ORDER — PHENYLEPHRINE 40 MCG/ML (10ML) SYRINGE FOR IV PUSH (FOR BLOOD PRESSURE SUPPORT): 80.0000 ug | PREFILLED_SYRINGE | INTRAVENOUS | Status: DC | PRN

## 2016-08-15 MED ORDER — FENTANYL CITRATE (PF) 100 MCG/2ML IJ SOLN
100.0000 ug | INTRAMUSCULAR | Status: DC | PRN
  Administered 2016-08-15 (×2): 100 ug via INTRAVENOUS
  Filled 2016-08-15 (×3): qty 2

## 2016-08-15 MED ORDER — PHENYLEPHRINE 40 MCG/ML (10ML) SYRINGE FOR IV PUSH (FOR BLOOD PRESSURE SUPPORT)
80.0000 ug | PREFILLED_SYRINGE | INTRAVENOUS | Status: DC | PRN
  Filled 2016-08-15: qty 10

## 2016-08-15 MED ORDER — PENICILLIN G POTASSIUM 5000000 UNITS IJ SOLR
5.0000 10*6.[IU] | Freq: Once | INTRAMUSCULAR | Status: AC
  Administered 2016-08-15: 5 10*6.[IU] via INTRAVENOUS
  Filled 2016-08-15: qty 5

## 2016-08-15 MED ORDER — LIDOCAINE HCL (PF) 1 % IJ SOLN: 30.0000 mL | INTRAMUSCULAR | Status: DC | PRN

## 2016-08-15 MED ORDER — FENTANYL 2.5 MCG/ML BUPIVACAINE 1/10 % EPIDURAL INFUSION (WH - ANES)
14.0000 mL/h | INTRAMUSCULAR | Status: DC | PRN
  Administered 2016-08-16: 14 mL/h via EPIDURAL
  Filled 2016-08-15 (×2): qty 100

## 2016-08-15 MED ORDER — OXYCODONE-ACETAMINOPHEN 5-325 MG PO TABS: 1.0000 | ORAL_TABLET | ORAL | Status: DC | PRN

## 2016-08-15 MED ORDER — PENICILLIN G POT IN DEXTROSE 60000 UNIT/ML IV SOLN
3.0000 10*6.[IU] | INTRAVENOUS | Status: DC
  Administered 2016-08-16 (×3): 3 10*6.[IU] via INTRAVENOUS
  Filled 2016-08-15 (×6): qty 50

## 2016-08-15 MED ORDER — LACTATED RINGERS IV SOLN
INTRAVENOUS | Status: DC
  Administered 2016-08-15 – 2016-08-16 (×2): via INTRAVENOUS

## 2016-08-15 MED ORDER — OXYTOCIN 40 UNITS IN LACTATED RINGERS INFUSION - SIMPLE MED
2.5000 [IU]/h | INTRAVENOUS | Status: DC
  Filled 2016-08-15: qty 1000

## 2016-08-15 MED ORDER — EPHEDRINE 5 MG/ML INJ: 10.0000 mg | INTRAVENOUS | Status: DC | PRN

## 2016-08-15 MED ORDER — DIPHENHYDRAMINE HCL 50 MG/ML IJ SOLN: 12.5000 mg | INTRAMUSCULAR | Status: DC | PRN

## 2016-08-15 MED ORDER — LACTATED RINGERS IV SOLN: 500.0000 mL | Freq: Once | INTRAVENOUS | Status: DC

## 2016-08-15 MED ORDER — SOD CITRATE-CITRIC ACID 500-334 MG/5ML PO SOLN: 30.0000 mL | ORAL | Status: DC | PRN

## 2016-08-15 MED ORDER — LACTATED RINGERS IV SOLN
500.0000 mL | INTRAVENOUS | Status: DC | PRN
  Administered 2016-08-16: 500 mL via INTRAVENOUS

## 2016-08-15 MED ORDER — ONDANSETRON HCL 4 MG/2ML IJ SOLN
4.0000 mg | Freq: Four times a day (QID) | INTRAMUSCULAR | Status: DC | PRN
  Administered 2016-08-16: 4 mg via INTRAVENOUS
  Filled 2016-08-15: qty 2

## 2016-08-15 MED ORDER — OXYTOCIN BOLUS FROM INFUSION
500.0000 mL | Freq: Once | INTRAVENOUS | Status: AC
  Administered 2016-08-16: 500 mL via INTRAVENOUS

## 2016-08-15 MED ORDER — ACETAMINOPHEN 325 MG PO TABS: 650.0000 mg | ORAL_TABLET | ORAL | Status: DC | PRN

## 2016-08-15 MED ORDER — OXYCODONE-ACETAMINOPHEN 5-325 MG PO TABS: 2.0000 | ORAL_TABLET | ORAL | Status: DC | PRN

## 2016-08-15 NOTE — MAU Note (Addendum)
G3P1 @ 40.[redacted] wksga. Presents to triage for contractions since yesterday and today go progressively got worse and not tolerable. Pt rates pain 8/10. Denies LOF or bleeding. +FM. EFM applied  1825: : SVE 3.5/60/BB  1856: Provider notified. Report status of pt given. Orders received to recheck cervix  SVE 5/70/BB

## 2016-08-15 NOTE — MAU Note (Signed)
Urine in lab 

## 2016-08-16 ENCOUNTER — Inpatient Hospital Stay (HOSPITAL_COMMUNITY): Payer: Medicaid Other | Admitting: Anesthesiology

## 2016-08-16 ENCOUNTER — Encounter (HOSPITAL_COMMUNITY): Payer: Self-pay | Admitting: Anesthesiology

## 2016-08-16 ENCOUNTER — Other Ambulatory Visit: Payer: Medicaid Other

## 2016-08-16 DIAGNOSIS — Z3A40 40 weeks gestation of pregnancy: Secondary | ICD-10-CM

## 2016-08-16 LAB — RPR: RPR Ser Ql: NONREACTIVE

## 2016-08-16 MED ORDER — TETANUS-DIPHTH-ACELL PERTUSSIS 5-2.5-18.5 LF-MCG/0.5 IM SUSP
0.5000 mL | Freq: Once | INTRAMUSCULAR | Status: DC
  Filled 2016-08-16: qty 0.5

## 2016-08-16 MED ORDER — COCONUT OIL OIL: 1.0000 "application " | TOPICAL_OIL | Status: DC | PRN

## 2016-08-16 MED ORDER — PHENYLEPHRINE 40 MCG/ML (10ML) SYRINGE FOR IV PUSH (FOR BLOOD PRESSURE SUPPORT): 80.0000 ug | PREFILLED_SYRINGE | INTRAVENOUS | Status: DC | PRN

## 2016-08-16 MED ORDER — FENTANYL 2.5 MCG/ML BUPIVACAINE 1/10 % EPIDURAL INFUSION (WH - ANES)
14.0000 mL/h | INTRAMUSCULAR | Status: DC | PRN
  Administered 2016-08-16 (×2): 14 mL/h via EPIDURAL

## 2016-08-16 MED ORDER — IBUPROFEN 600 MG PO TABS
600.0000 mg | ORAL_TABLET | Freq: Four times a day (QID) | ORAL | Status: DC
  Administered 2016-08-16 – 2016-08-18 (×7): 600 mg via ORAL
  Filled 2016-08-16 (×7): qty 1

## 2016-08-16 MED ORDER — SIMETHICONE 80 MG PO CHEW: 80.0000 mg | CHEWABLE_TABLET | ORAL | Status: DC | PRN

## 2016-08-16 MED ORDER — LIDOCAINE HCL (PF) 1 % IJ SOLN
INTRAMUSCULAR | Status: DC | PRN
  Administered 2016-08-16: 5 mL
  Administered 2016-08-16 (×2): 7 mL via EPIDURAL

## 2016-08-16 MED ORDER — ACETAMINOPHEN 325 MG PO TABS: 650.0000 mg | ORAL_TABLET | ORAL | Status: DC | PRN

## 2016-08-16 MED ORDER — PHENYLEPHRINE 40 MCG/ML (10ML) SYRINGE FOR IV PUSH (FOR BLOOD PRESSURE SUPPORT)
80.0000 ug | PREFILLED_SYRINGE | INTRAVENOUS | Status: DC | PRN
  Administered 2016-08-16: 80 ug via INTRAVENOUS

## 2016-08-16 MED ORDER — BUPIVACAINE HCL (PF) 0.25 % IJ SOLN
INTRAMUSCULAR | Status: DC | PRN
  Administered 2016-08-16: 2 mL via EPIDURAL

## 2016-08-16 MED ORDER — DIBUCAINE 1 % RE OINT: 1.0000 "application " | TOPICAL_OINTMENT | RECTAL | Status: DC | PRN

## 2016-08-16 MED ORDER — LACTATED RINGERS IV SOLN
500.0000 mL | Freq: Once | INTRAVENOUS | Status: AC
  Administered 2016-08-16: 500 mL via INTRAVENOUS

## 2016-08-16 MED ORDER — BUTORPHANOL TARTRATE 1 MG/ML IJ SOLN
2.0000 mg | Freq: Once | INTRAMUSCULAR | Status: AC
  Administered 2016-08-16: 2 mg via INTRAVENOUS
  Filled 2016-08-16: qty 2

## 2016-08-16 MED ORDER — ZOLPIDEM TARTRATE 5 MG PO TABS: 5.0000 mg | ORAL_TABLET | Freq: Every evening | ORAL | Status: DC | PRN

## 2016-08-16 MED ORDER — MEASLES, MUMPS & RUBELLA VAC ~~LOC~~ INJ
0.5000 mL | INJECTION | Freq: Once | SUBCUTANEOUS | Status: DC
  Filled 2016-08-16: qty 0.5

## 2016-08-16 MED ORDER — TERBUTALINE SULFATE 1 MG/ML IJ SOLN: 0.2500 mg | Freq: Once | INTRAMUSCULAR | Status: DC | PRN

## 2016-08-16 MED ORDER — OXYTOCIN 40 UNITS IN LACTATED RINGERS INFUSION - SIMPLE MED
1.0000 m[IU]/min | INTRAVENOUS | Status: DC
  Administered 2016-08-16: 2 m[IU]/min via INTRAVENOUS

## 2016-08-16 MED ORDER — PRENATAL MULTIVITAMIN CH
1.0000 | ORAL_TABLET | Freq: Every day | ORAL | Status: DC
  Administered 2016-08-17: 1 via ORAL
  Filled 2016-08-16: qty 1

## 2016-08-16 MED ORDER — DIPHENHYDRAMINE HCL 50 MG/ML IJ SOLN: 12.5000 mg | INTRAMUSCULAR | Status: DC | PRN

## 2016-08-16 MED ORDER — ONDANSETRON HCL 4 MG/2ML IJ SOLN: 4.0000 mg | INTRAMUSCULAR | Status: DC | PRN

## 2016-08-16 MED ORDER — EPHEDRINE 5 MG/ML INJ: 10.0000 mg | INTRAVENOUS | Status: DC | PRN

## 2016-08-16 MED ORDER — SENNOSIDES-DOCUSATE SODIUM 8.6-50 MG PO TABS
2.0000 | ORAL_TABLET | ORAL | Status: DC
  Administered 2016-08-17 (×2): 2 via ORAL
  Filled 2016-08-16 (×2): qty 2

## 2016-08-16 MED ORDER — DIPHENHYDRAMINE HCL 25 MG PO CAPS: 25.0000 mg | ORAL_CAPSULE | Freq: Four times a day (QID) | ORAL | Status: DC | PRN

## 2016-08-16 MED ORDER — BENZOCAINE-MENTHOL 20-0.5 % EX AERO: 1.0000 "application " | INHALATION_SPRAY | CUTANEOUS | Status: DC | PRN

## 2016-08-16 MED ORDER — ONDANSETRON HCL 4 MG PO TABS: 4.0000 mg | ORAL_TABLET | ORAL | Status: DC | PRN

## 2016-08-16 MED ORDER — WITCH HAZEL-GLYCERIN EX PADS: 1.0000 "application " | MEDICATED_PAD | CUTANEOUS | Status: DC | PRN

## 2016-08-16 NOTE — Anesthesia Postprocedure Evaluation (Signed)
Anesthesia Post Note  Patient: [name]  Procedure(s) Performed: * No procedures listed *  Patient location during evaluation: Mother Baby Anesthesia Type: Epidural Level of consciousness: awake and alert and oriented Pain management: satisfactory to patient Vital Signs Assessment: post-procedure vital signs reviewed and stable Respiratory status: spontaneous breathing and nonlabored ventilation Cardiovascular status: stable Postop Assessment: no headache, no backache, no signs of nausea or vomiting, adequate PO intake and patient able to bend at knees (patient up walking) Anesthetic complications: no        Last Vitals:  Vitals:   08/16/16 1523 08/16/16 1930  BP: (!) 119/43 (!) 107/57  Pulse: (!) 108 82  Resp: 18 18  Temp: 37 C 37.2 C    Last Pain:  Vitals:   08/16/16 1930  TempSrc: Oral  PainSc: 0-No pain   Pain Goal: Patients Stated Pain Goal: 2 (08/16/16 0506)               Madison HickmanGREGORY,Maurissa Ambrose

## 2016-08-16 NOTE — Anesthesia Procedure Notes (Signed)
Epidural Patient location during procedure: OB Start time: 08/16/2016 5:22 AM End time: 08/16/2016 5:27 AM  Staffing Anesthesiologist: Leilani AbleHATCHETT, Demika Langenderfer Performed: anesthesiologist   Preanesthetic Checklist Completed: patient identified, surgical consent, pre-op evaluation, timeout performed, IV checked, risks and benefits discussed and monitors and equipment checked  Epidural Patient position: sitting Prep: site prepped and draped and DuraPrep Patient monitoring: continuous pulse ox and blood pressure Approach: midline Injection technique: LOR air  Needle:  Needle type: Tuohy  Needle gauge: 17 G Needle length: 9 cm and 9 Needle insertion depth: 6 cm Catheter type: closed end flexible Catheter size: 19 Gauge Catheter at skin depth: 12 cm Test dose: negative and Other  Assessment Sensory level: T9 Events: blood not aspirated, injection not painful, no injection resistance, negative IV test and no paresthesia  Additional Notes Reason for block:procedure for pain

## 2016-08-16 NOTE — Anesthesia Preprocedure Evaluation (Signed)
Anesthesia Evaluation  Patient identified by MRN, date of birth, ID band Patient awake    Reviewed: Allergy & Precautions, H&P , NPO status , Patient's Chart, lab work & pertinent test results  Airway Mallampati: II  TM Distance: >3 FB Neck ROM: full    Dental no notable dental hx.    Pulmonary neg pulmonary ROS,    Pulmonary exam normal breath sounds clear to auscultation       Cardiovascular negative cardio ROS Normal cardiovascular exam Rhythm:regular Rate:Normal     Neuro/Psych negative neurological ROS  negative psych ROS   GI/Hepatic negative GI ROS, Neg liver ROS,   Endo/Other  Morbid obesity  Renal/GU negative Renal ROS  negative genitourinary   Musculoskeletal negative musculoskeletal ROS (+)   Abdominal (+) + obese,   Peds  Hematology negative hematology ROS (+)   Anesthesia Other Findings   Reproductive/Obstetrics (+) Pregnancy                             Anesthesia Physical  Anesthesia Plan  ASA: III  Anesthesia Plan: Epidural   Post-op Pain Management:    Induction:   Airway Management Planned:   Additional Equipment:   Intra-op Plan:   Post-operative Plan:   Informed Consent: I have reviewed the patients History and Physical, chart, labs and discussed the procedure including the risks, benefits and alternatives for the proposed anesthesia with the patient or authorized representative who has indicated his/her understanding and acceptance.     Plan Discussed with:   Anesthesia Plan Comments:         Anesthesia Quick Evaluation

## 2016-08-16 NOTE — Progress Notes (Signed)
UR chart review completed.  

## 2016-08-16 NOTE — Progress Notes (Signed)
Patient seen doing well. Patient with Decel overnight and FSE was placed. Clear fluid since. No change in cervix. About 5cm. Epidural in place. Will start pitocin at this time. No further decels. Category 1 tracing.  Ernestina PennaNicholas Jordin Dambrosio, MD 08/16/16 6:56 AM

## 2016-08-16 NOTE — Lactation Note (Signed)
This note was copied from a baby's chart. Lactation Consultation Note P2 mom with bf exp and pumping experience of 5-6 months with now 1 yr. Old.  Pt states she quit due to being tired.  Pt. Wants to breast and bottle feed breast milk.  Preference on admission was formula and BM.  Baby cueing and LC offered to assist mom with latching infant.  Mom was taught hand expression and was able to get several drops with one compression.  Mom said she was planning on waiting for awhile to feed and LC reviewed cues and feeding 8-12 times in 24 hours and the importance of hand expression prior to and after feeds.  Mom understands this and can perform hand expression.  Maternal grandmother was encouraging and supportive.  Infant was positioned in cross cradle hold and latched easily.  Mom's breast are easily compressible with semi flat nipples.  LC taught mom to sandwich breast and to use compression and massage throughout feed.  Swallows heard with massage and infant had wipe gape with flanged lips throughout feed.   Good rhythmic suck pattern observed.  [facility]  Requested hand pump but declined review of pump.  Mom states she is familiar with hand pump.  LC Encouraged mom to attend [facility].  OP lactation services and brochure info reviewed with family.  Mom encouraged to call out for further concerns, questions, or assistance with breastfeeding.    Patient Name: Beverly Anthony Today's Date: 08/16/2016 Reason for consult: Initial assessment   Maternal Data Formula Feeding for Exclusion: No Has patient been taught Hand Expression?: Yes Does the patient have breastfeeding experience prior to this delivery?: Yes  Feeding Feeding Type: Breast Fed Length of feed: 20 min  LATCH Score/Interventions Latch: Grasps breast easily, tongue down, lips flanged, rhythmical sucking.  Audible Swallowing: A few with stimulation Intervention(s): Hand expression;Skin to skin  Type of Nipple: Everted at rest and  after stimulation (semi flat but easily compressible/latches well)  Comfort (Breast/Nipple): Soft / non-tender     Hold (Positioning): Assistance needed to correctly position infant at breast and maintain latch. Intervention(s): Breastfeeding basics reviewed;Position options;Skin to skin;Support Pillows  LATCH Score: 8  Lactation Tools Discussed/Used [facility]IC Program: [facility] ([facility] [facility])   Consult Status Consult Status: Follow-up Date: 08/17/16 Follow-up type: In-patient    Beverly Anthony 08/16/2016, 3:58 PM

## 2016-08-16 NOTE — Anesthesia Pain Management Evaluation Note (Signed)
  CRNA Pain Management Visit Note  Patient: Beverly Anthony, 21 y.o., female  "Hello [facility] am a [facility]. We have an anesthesia team available at all times to provide care throughout the hospital, including epidural management and anesthesia for C-section. I don't know your plan for the delivery whether it a natural birth, water birth, IV sedation, nitrous supplementation, doula or epidural, but we want to meet your pain goals."   1.Was your pain managed to your expectations on prior hospitalizations?   Yes   2.What is your expectation for pain management during this hospitalization?     Epidural  3.How can we help you reach that goal?   Record the patient's initial score and the patient's pain goal.   Pain: 3  Pain Goal: 6 [facility] wants you to be able to say your pain was always managed very well.  Laban EmperorMalinova,Bekka Qian Hristova 08/16/2016

## 2016-08-17 NOTE — Progress Notes (Signed)
Post Partum Day 1  Subjective:  Beverly Anthony is a 21 y.o. [id number] 3860w3d s/p NVSD.  No acute events overnight.  Pt denies problems with ambulating, voiding or po intake.  She denies nausea or vomiting.  Pain is well controlled.  She has had flatus. She has not had bowel movement.  Lochia Small.   Method of Feeding: breast and bottle feeding  Objective: BP (!) 102/52 (BP Location: Left Arm)   Pulse 88   Temp 98.2 F (36.8 C) (Oral)   Resp 16   Ht 5\' 2"  (1.575 m)   Wt 121.6 kg (268 lb)   SpO2 100%   Breastfeeding? Unknown   BMI 49.02 kg/m   Physical Exam:  General: alert, cooperative and no distress Chest: normal work of breathing Abdomen:  soft, nontender Uterine Fundus: fundus firm below umbilicus DVT Evaluation: No evidence of DVT seen on physical exam. Extremities: minimal pedal edema   Recent Labs  08/15/16 2030  HGB 10.9*  HCT 33.2*    Assessment/Plan:  ASSESSMENT: Beverly Anthony is a 21 y.o. [id number] 1360w3d ppd #1 s/p NSVD doing well.   Continue current care Plan for discharge tomorrow   LOS: 2 days   Ivan AnchorsJohn Adeolu [facility] Medical Student 08/17/2016, 7:46 AM    OB FELLOW POSTPARTUM PROGRESS NOTE ATTESTATION  I have seen and examined this patient and agree with above documentation in the student's note. Patient doing well, ambulating, urinating without difficulty, lochia mild. Discussed birth control with patient, she changed her mind and would like to do the Nexplanon instead. Plan for discharge tomorrow.  Jen MowElizabeth Tawfiq Favila, DO OB Fellow

## 2016-08-17 NOTE — Progress Notes (Signed)
Patient received no book and did not sign safety sheets upon admission to unit.  This RN reviewed sheets in book, discussed resources available to patient, and reviewed and signed both safety sheets.

## 2016-08-17 NOTE — Lactation Note (Signed)
This note was copied from a baby's chart. Lactation Consultation Note  Patient Name: Boy [name] BJYNW'GToday's Date: 08/17/2016 Reason for consult: Follow-up assessment Baby at 24 hr of life. Mom reports baby is latching well but she is going to stop latching baby when she gets home. She desires to offer expressed milk and formula. Discussed baby behavior, feeding frequency, baby belly size, voids, wt loss, breast changes, and nipple care. She will f/u with WIC after d/c. She is aware of lactation services and support group. She will call as needed.   Maternal Data    Feeding Feeding Type: Breast Fed Length of feed: 15 min  LATCH Score/Interventions Latch: Grasps breast easily, tongue down, lips flanged, rhythmical sucking.  Audible Swallowing: Spontaneous and intermittent Intervention(s): Skin to skin  Type of Nipple: Everted at rest and after stimulation  Comfort (Breast/Nipple): Soft / non-tender     Hold (Positioning): Assistance needed to correctly position infant at breast and maintain latch.  LATCH Score: 9  Lactation Tools Discussed/Used     Consult Status Consult Status: PRN    Rulon Eisenmengerlizabeth E Zykeriah Mathia 08/17/2016, 12:23 PM

## 2016-08-18 MED ORDER — MEDROXYPROGESTERONE ACETATE 150 MG/ML IM SUSP: 150.0000 mg | Freq: Once | INTRAMUSCULAR | Status: DC

## 2016-08-18 MED ORDER — IBUPROFEN 600 MG PO TABS: 600.0000 mg | ORAL_TABLET | Freq: Four times a day (QID) | ORAL | 1 refills | Status: DC | PRN

## 2016-08-18 NOTE — Lactation Note (Signed)
This note was copied from a baby's chart. Lactation Consultation Note  Patient Name: Beverly Anthony ZOXWR'UToday's Date: 08/18/2016 Reason for consult: Follow-up assessment   Follow up with mom of 46 hour old infant. Infant was being fed a bottle of formula when LC entered room. Mom reports she plans to pump at home. Manual pump given with instructions for use, assembling, disassembling and cleaning of pump parts.   [facility] and is aware to call and make appt. Engorgement prevention/treatment and Breast milk handling and storage reviewed with mom. Mom declined need for any additional services/needs at this time. Enc mom to call WIC or LC with any BF questions/concerns. Mom without further questions/concerns at this time.    Maternal Data Formula Feeding for Exclusion: Yes Reason for exclusion: Mother's choice to formula and breast feed on admission Has patient been taught Hand Expression?: Yes Does the patient have breastfeeding experience prior to this delivery?: Yes  Feeding Feeding Type: Formula  LATCH Score/Interventions                      Lactation Tools Discussed/Used     Consult Status Consult Status: Complete Follow-up type: Call as needed    Ed BlalockSharon S Hilliard Borges 08/18/2016, 10:45 AM

## 2016-08-18 NOTE — H&P (Signed)
LABOR AND DELIVERY ADMISSION HISTORY AND PHYSICAL NOTE  Beverly Anthony is a 21 y.o. female [phone] with IUP at [redacted]w[redacted]d by LMP and 19 wk [place] presenting for SOL.   She reports positive fetal movement. She denies leakage of fluid or vaginal bleeding.  Prenatal History/Complications:  Past Medical History: Past Medical History:  Diagnosis Date  . Medical history non-contributory   . Obesity     Past Surgical History: Past Surgical History:  Procedure Laterality Date  . EXTERNAL EAR SURGERY Bilateral 2017   keloid removal    Obstetrical History: OB History    Gravida Para Term Preterm AB Living   3 2 2  0 1 2   SAB TAB Ectopic Multiple Live Births   1 0 0 0 2      Obstetric Comments   03/12/14 SAB 12 wks      Social History: Social History   Social History  . Marital status: Unknown    Spouse name: N/A  . Number of children: N/A  . Years of education: N/A   Social History Main Topics  . Smoking status: Never Smoker  . Smokeless tobacco: Never Used  . Alcohol use No  . Drug use: No  . Sexual activity: Yes   Other Topics Concern  . None   Social History Narrative  . None    Family History: Family History  Problem Relation Age of Onset  . Cancer Paternal Grandfather   . Diabetes Father   . Hypertension Father     Allergies: No Known Allergies  Prescriptions Prior to Admission  Medication Sig Dispense Refill Last Dose  . acetaminophen (TYLENOL) 500 MG tablet Take 500 mg by mouth every 6 (six) hours as needed for mild pain.   Past Month at Unknown time  . Prenatal Multivit-Min-Fe-FA (PRENATAL VITAMINS) 0.8 MG tablet Take 1 tablet by mouth daily. 30 tablet 12 08/14/2016 at 1000     Review of Systems   All systems reviewed and negative except as stated in HPI  Blood pressure (!) 105/56, pulse 68, temperature 98.3 F (36.8 C), temperature source Oral, resp. rate 16, height 5\' 2"  (1.575 m), weight 268 lb (121.6 kg), SpO2 100 %, unknown if currently  breastfeeding. General appearance: alert, cooperative and appears stated age Lungs: clear to auscultation bilaterally Heart: regular rate and rhythm Abdomen: soft, non-tender; bowel sounds normal Extremities: No calf swelling or tenderness Presentation: cephalic Fetal monitoring: category 1 Uterine activity: contractions q5 mintues Dilation: 10 Effacement (%): 100 Station: +2 Exam by:: Foley,rn   Prenatal labs: ABO, Rh: --/--/B POS (05/28 2030) Antibody: NEG (05/28 2030) Rubella: immune RPR: Non Reactive (05/28 2030)  HBsAg: Negative (11/20 0000)  HIV: Non-reactive (11/20 0000)  GBS: Positive (04/30 0000)  1 hr Glucola: 86 Genetic screening:  Quad negative Anatomy US: normal  Prenatal Transfer Tool  Maternal Diabetes: No Genetic Screening: Normal Maternal Ultrasounds/Referrals: Normal Fetal Ultrasounds or other Referrals:  None Maternal Substance Abuse:  No Significant Maternal Medications:  None Significant Maternal Lab Results: Lab values include: Group B Strep positive  No results found for this or any previous visit (from the past 24 hour(s)).  Patient Active Problem List   Diagnosis Date Noted  . Normal labor 08/15/2016  . Post term pregnancy 03/25/2015  . Group B streptococcal bacteriuria 02/11/2015  . Gallbladder stone without cholecystitis or obstruction 02/09/2015  . Supervision of normal pregnancy 01/10/2015  . Sickle cell trait (HCC) 01/10/2015    Assessment: Beverly Anthony is a 21 y.o. Q6V7846  at 29106w3d here for SOL  #Labor:expectant management #Pain: epdirual #FWB: Category 1 #ID:  GBS pos #MOF: breast feeding #MOC:nexplanon #Circ:  outpatient  Ernestina PennaNicholas Kaityln Kallstrom MD

## 2016-08-18 NOTE — Discharge Summary (Signed)
OB Discharge Summary  Patient Name: Beverly Anthony Campbell DOB: [birth year] MRN: [id number]  Date of admission: 08/15/2016 Delivering MD: Shonna Chock BEDFORD   Date of discharge: 08/18/2016  Admitting diagnosis: 40.3WKS CTX 10 MINS APART  Intrauterine pregnancy: [redacted]w[redacted]d     Secondary diagnosis:Active Problems:   Normal labor  Additional problems:morbid obesity, short interval between pregnancies     Discharge diagnosis: Term Pregnancy Delivered        Augmentation: AROM and Pitocin  Complications: None  Hospital course:  Onset of Labor With Vaginal Delivery     21 y.o. yo G3O7564 at [redacted]w[redacted]d was admitted in Latent Labor on 08/15/2016. Patient had an uncomplicated labor course as follows:  Membrane Rupture Time/Date: 4:20 AM ,08/16/2016   Intrapartum Procedures: Episiotomy: None [1]                                         Lacerations:  None [1]  Patient had a delivery of a Viable infant. 08/16/2016  Information for the patient's newborn:  Beverly Anthony [[id number]]  Delivery Method: Vaginal, Spontaneous Delivery (Filed from Delivery Summary)    Pateint had an uncomplicated postpartum course.  She is ambulating, tolerating a regular diet, passing flatus, and urinating well. Patient is discharged home in stable condition on 08/18/16.   Physical exam  Vitals:   08/17/16 0900 08/17/16 1300 08/17/16 1758 08/18/16 0546  BP: (!) 104/46 (!) 105/58 (!) 117/51 (!) 105/56  Pulse: 68 73 97 68  Resp: 18 18 18 16   Temp: 98.7 F (37.1 C) 98.5 F (36.9 C) 98.3 F (36.8 C) 98.3 F (36.8 C)  TempSrc: Oral Oral Oral Oral  SpO2: 100% 100%    Weight:      Height:       General: alert Lochia: appropriate Uterine Fundus: firm Incision: N/A DVT Evaluation: No evidence of DVT seen on physical exam. Labs: Lab Results  Component Value Date   WBC 13.9 (H) 08/15/2016   HGB 10.9 (L) 08/15/2016   HCT 33.2 (L) 08/15/2016   MCV 74.8 (L) 08/15/2016   PLT 203 08/15/2016   CMP Latest Ref  Rng & Units 02/09/2015  Glucose 65 - 99 mg/dL 166(A)  BUN 6 - 20 mg/dL 5(L)  Creatinine 6.30 - 1.00 mg/dL 1.60  Sodium 109 - 323 mmol/L 135  Potassium 3.5 - 5.1 mmol/L 3.7  Chloride 101 - 111 mmol/L 103  CO2 22 - 32 mmol/L 24  Calcium 8.9 - 10.3 mg/dL 9.2  Total Protein 6.5 - 8.1 g/dL 7.1  Total Bilirubin 0.3 - 1.2 mg/dL 0.5  Alkaline Phos 38 - 126 U/L 122  AST 15 - 41 U/L 19  ALT 14 - 54 U/L 16    Discharge instruction: per After Visit Summary and "Baby and Me Booklet".  After Visit Meds:  Allergies as of 08/18/2016   No Known Allergies     Medication List    STOP taking these medications   Prenatal Vitamins 0.8 MG tablet     TAKE these medications   acetaminophen 500 MG tablet Commonly known as:  TYLENOL Take 500 mg by mouth every 6 (six) hours as needed for mild pain.   ibuprofen 600 MG tablet Commonly known as:  ADVIL,MOTRIN Take 1 tablet (600 mg total) by mouth every 6 (six) hours as needed. What changed:  when to take this  reasons to take this  Diet: routine diet  Activity: Advance as tolerated. Pelvic rest for 6 weeks.   Outpatient follow up:6 weeks Follow up Appt:No future appointments. Follow up visit: No Follow-up on file.  Postpartum contraception: Depo Provera prior to discharge and plans Nexplanon at postpartum visit  Newborn Data: Live born female  Birth Weight: 7 lb 5.8 oz (3340 g) APGAR: 8, 9  Baby Feeding: Bottle Disposition:home with mother   08/18/2016 Beverly BossierMyra C Kharis Lapenna, MD

## 2016-08-18 NOTE — Discharge Instructions (Signed)

## 2016-08-20 ENCOUNTER — Inpatient Hospital Stay (HOSPITAL_COMMUNITY): Admission: RE | Admit: 2016-08-20 | Payer: Medicaid Other | Source: Ambulatory Visit

## 2016-12-17 IMAGING — US US ABDOMEN COMPLETE
1 series · 15 of 25 positions shown · non-contrast
Comparison: None.

CLINICAL DATA: Abdominal pain.

EXAM:
ULTRASOUND ABDOMEN COMPLETE

[Series 1: us abdomen complete · 15 of 68 slices shown]
[im 1/68]
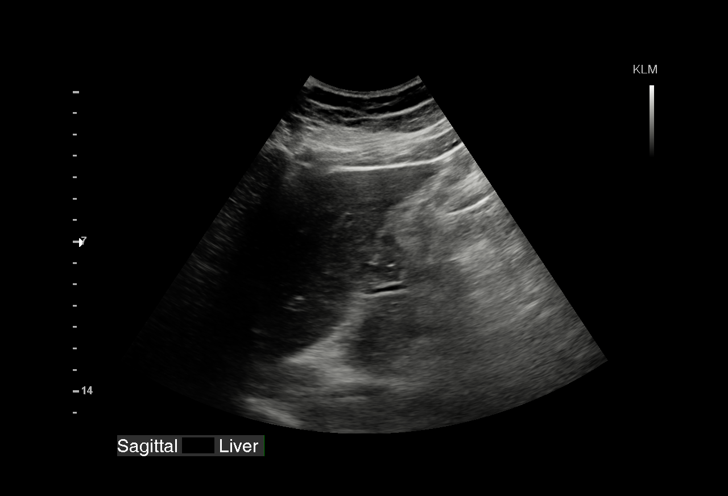
[im 6/68]
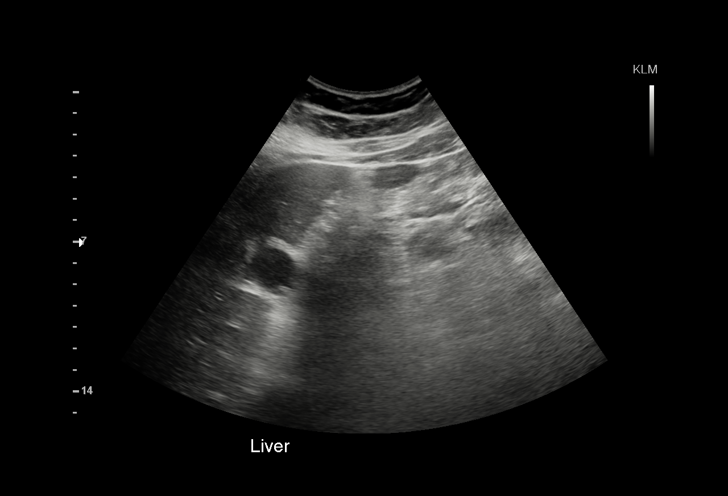
[im 12/68]
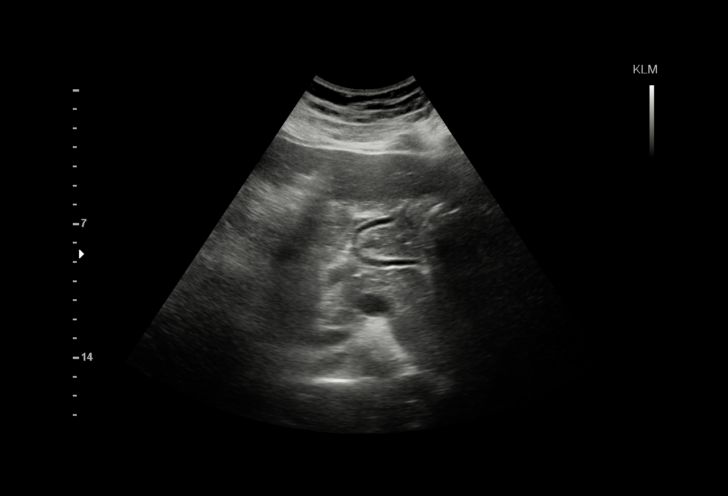
[im 14/68]
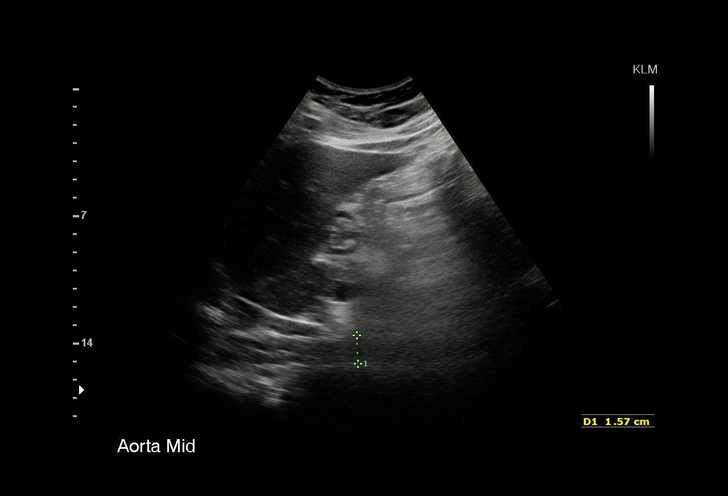
[im 20/68]
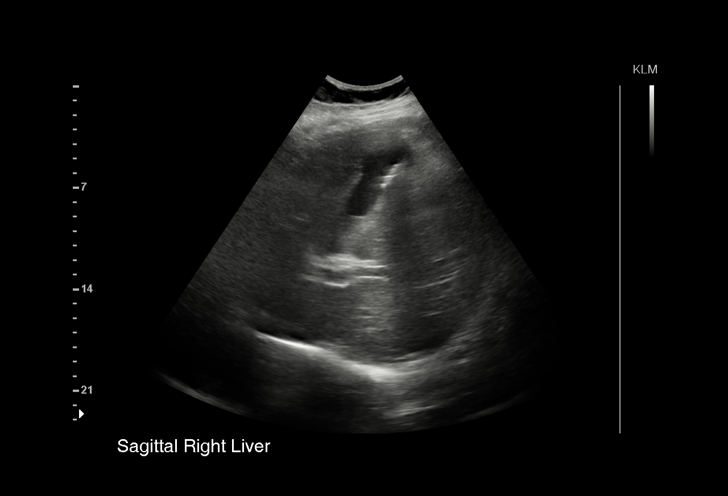
[im 26/68]
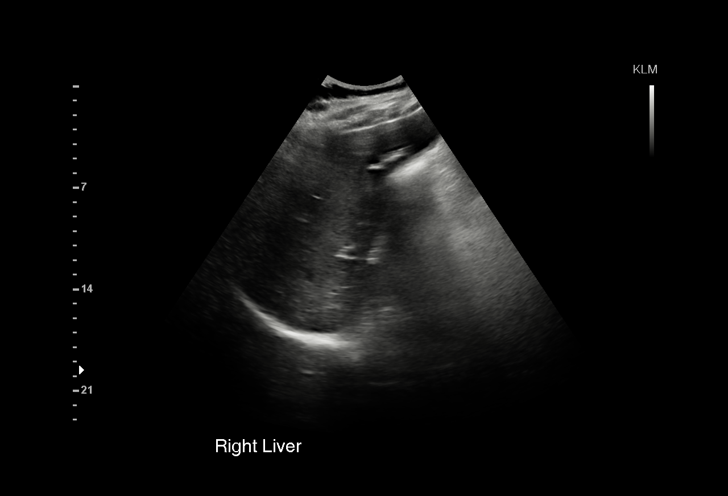
[im 28/68]
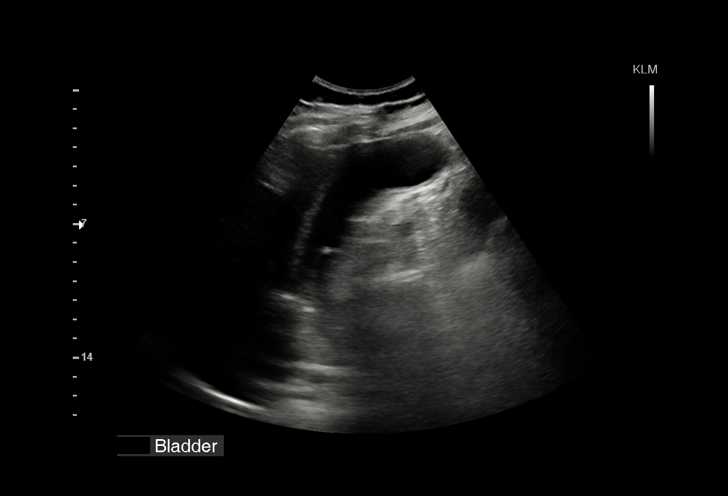
[im 34/68]
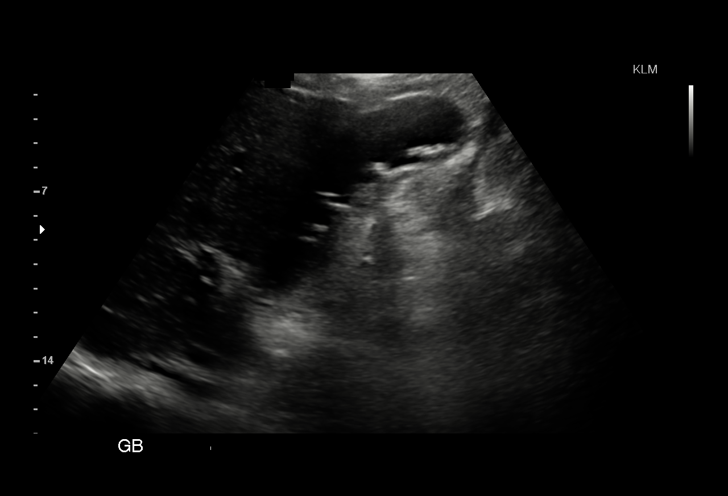
[im 40/68]
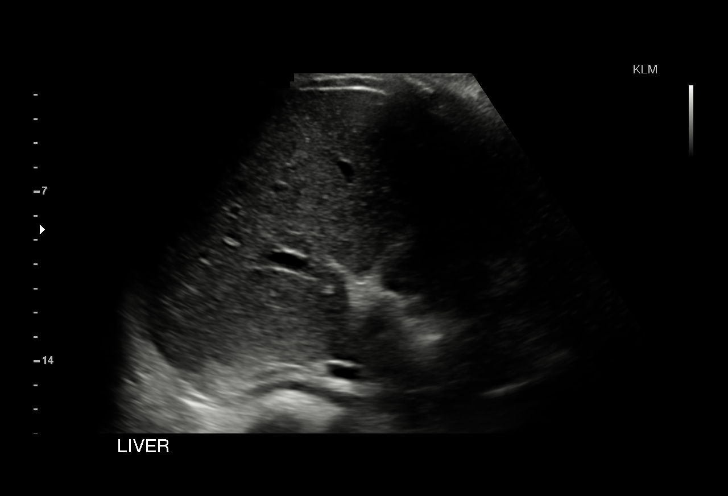
[im 42/68]
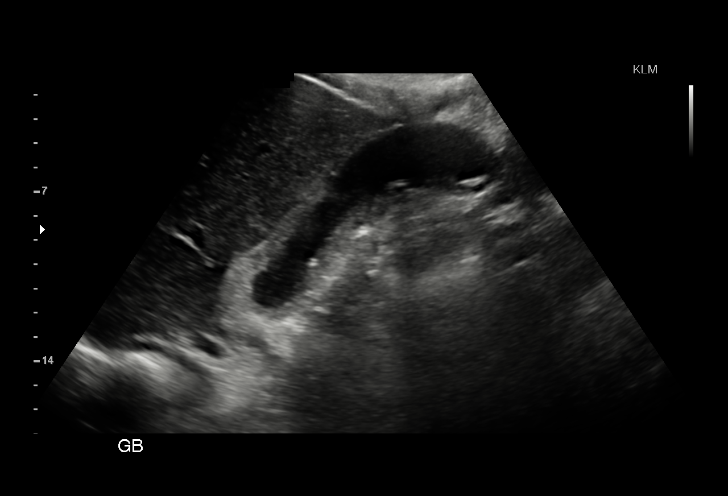
[im 48/68]
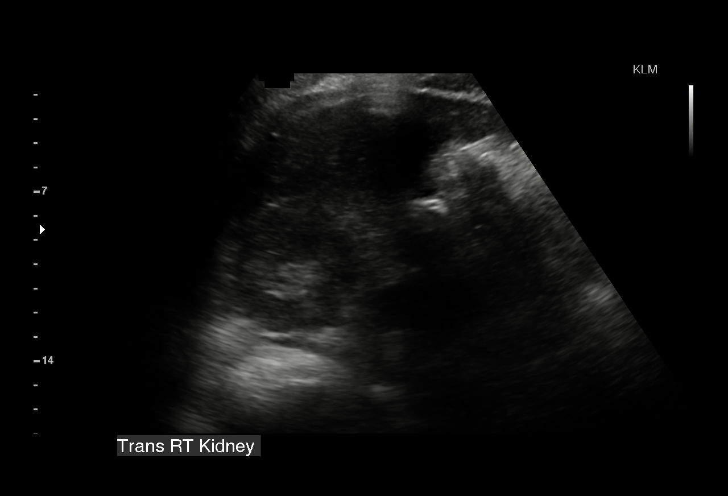
[im 54/68]
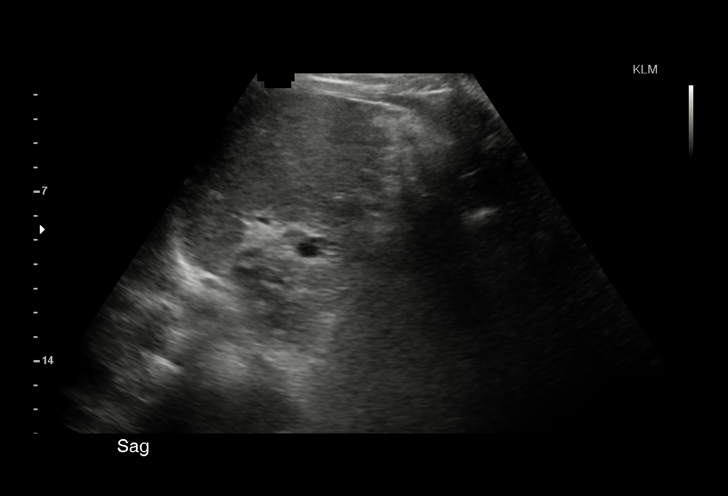
[im 56/68]
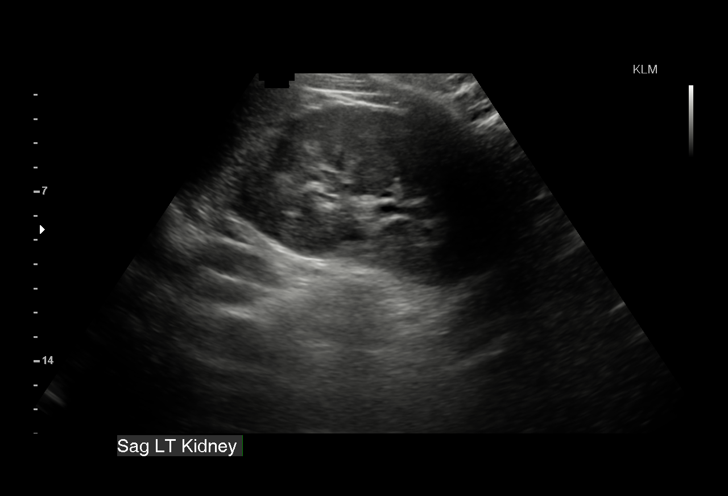
[im 62/68]
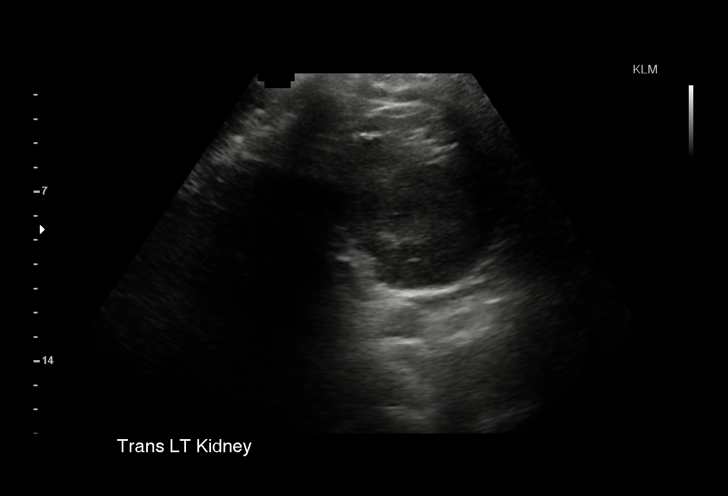
[im 68/68]
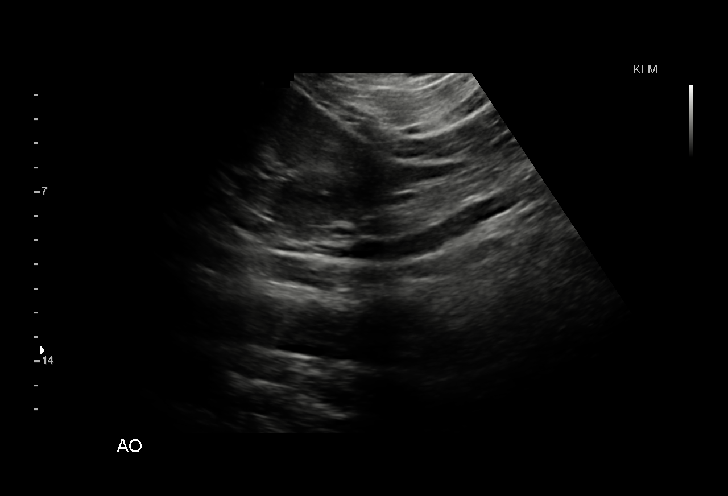

[15 of 25 positions shown; findings below may reference images not displayed]

FINDINGS: Gallbladder: Multiple gallstones are identified. These measure up to
7 mm. No gallbladder wall thickening or pericholecystic fluid.
Negative sonographic Murphy's sign.

Common bile duct: Diameter: Multiple

Liver: No focal lesion identified. Within normal limits in
parenchymal echogenicity.

IVC: No abnormality visualized.

Pancreas: Visualized portion unremarkable.

Spleen: Size and appearance within normal limits.

Right Kidney: Length: 10.4 cm. Echogenicity within normal limits. No
mass or hydronephrosis visualized.

Left Kidney: Length: 12.4 cm. Echogenicity within normal limits. No
mass or hydronephrosis visualized.

Abdominal aorta: No aneurysm visualized.

Other findings: None.
IMPRESSION: 1. Gallstones.  No secondary signs of acute cholecystitis.

## 2017-02-21 ENCOUNTER — Encounter (HOSPITAL_COMMUNITY): Payer: Self-pay | Admitting: *Deleted

## 2017-02-21 ENCOUNTER — Inpatient Hospital Stay (HOSPITAL_COMMUNITY): Payer: Medicaid Other

## 2017-02-21 ENCOUNTER — Other Ambulatory Visit: Payer: Self-pay

## 2017-02-21 ENCOUNTER — Inpatient Hospital Stay (HOSPITAL_COMMUNITY)
Admission: AD | Admit: 2017-02-21 | Discharge: 2017-02-21 | Disposition: A | Discharge status: Home / Self Care | Payer: Medicaid Other | Source: Ambulatory Visit | Attending: Family Medicine | Admitting: Family Medicine

## 2017-02-21 DIAGNOSIS — O209 Hemorrhage in early pregnancy, unspecified: Secondary | ICD-10-CM

## 2017-02-21 DIAGNOSIS — Z3A13 13 weeks gestation of pregnancy: Secondary | ICD-10-CM

## 2017-02-21 DIAGNOSIS — O26892 Other specified pregnancy related conditions, second trimester: Secondary | ICD-10-CM | POA: Diagnosis not present

## 2017-02-21 DIAGNOSIS — E669 Obesity, unspecified: Secondary | ICD-10-CM | POA: Insufficient documentation

## 2017-02-21 DIAGNOSIS — Z3A14 14 weeks gestation of pregnancy: Secondary | ICD-10-CM | POA: Insufficient documentation

## 2017-02-21 DIAGNOSIS — O99212 Obesity complicating pregnancy, second trimester: Secondary | ICD-10-CM | POA: Diagnosis not present

## 2017-02-21 LAB — CBC
HEMATOCRIT: 36.6 % (ref 36.0–46.0)
Hemoglobin: 12.3 g/dL (ref 12.0–15.0)
MCH: 26.6 pg (ref 26.0–34.0)
MCHC: 33.6 g/dL (ref 30.0–36.0)
MCV: 79 fL (ref 78.0–100.0)
PLATELETS: 233 10*3/uL (ref 150–400)
RBC: 4.63 MIL/uL (ref 3.87–5.11)
RDW: 14.5 % (ref 11.5–15.5)
WBC: 9.2 10*3/uL (ref 4.0–10.5)

## 2017-02-21 LAB — WET PREP, GENITAL
Sperm: NONE SEEN
TRICH WET PREP: NONE SEEN
Yeast Wet Prep HPF POC: NONE SEEN

## 2017-02-21 LAB — HCG, QUANTITATIVE, PREGNANCY: hCG, Beta Chain, Quant, S: 126482 m[IU]/mL — ABNORMAL HIGH (ref <5)

## 2017-02-21 LAB — POCT PREGNANCY, URINE: Preg Test, Ur: POSITIVE — AB

## 2017-02-21 NOTE — MAU Note (Signed)
Been cramping and spotting a little.  Started today.  Is preg, has not been seen yet (everyone is full, no available appointments). +HPT about a month ago.

## 2017-02-21 NOTE — MAU Note (Signed)
Urine sent to lab 

## 2017-02-21 NOTE — MAU Provider Note (Signed)
History     CSN: [id number]  Arrival date and time: 02/21/17 1655   First Provider Initiated Contact with Patient 02/21/17 1804      Chief Complaint  Patient presents with  . Vaginal Bleeding   Vaginal Bleeding  The patient's primary symptoms include pelvic pain and vaginal bleeding. This is a new problem. The current episode started yesterday. The problem occurs intermittently. The problem has been unchanged. Pain severity now: 4/10  The problem affects the left side. She is pregnant. Pertinent negatives include no chills, constipation, diarrhea, dysuria, fever, frequency, nausea, urgency or vomiting. The vaginal bleeding is spotting. She has not been passing clots. She has not been passing tissue. Nothing aggravates the symptoms. She has tried nothing for the symptoms. She is sexually active (no intercourse in the last 24 hours. ). She uses nothing for contraception. Her menstrual history has been regular (LMP 11/19/16 ).   Past Medical History:  Diagnosis Date  . Medical history non-contributory   . Obesity     Past Surgical History:  Procedure Laterality Date  . EXTERNAL EAR SURGERY Bilateral 2017   keloid removal    Family History  Problem Relation Age of Onset  . Cancer Paternal Grandfather   . Diabetes Father   . Hypertension Father     Social History   Tobacco Use  . Smoking status: Never Smoker  . Smokeless tobacco: Never Used  Substance Use Topics  . Alcohol use: No  . Drug use: No    Allergies: No Known Allergies  Medications Prior to Admission  Medication Sig Dispense Refill Last Dose  . acetaminophen (TYLENOL) 500 MG tablet Take 500 mg by mouth every 6 (six) hours as needed for mild pain.   Past Month at Unknown time  . ibuprofen (ADVIL,MOTRIN) 600 MG tablet Take 1 tablet (600 mg total) by mouth every 6 (six) hours as needed. 30 tablet 1     Review of Systems  Constitutional: Negative for chills and fever.  Gastrointestinal: Negative for  constipation, diarrhea, nausea and vomiting.  Genitourinary: Positive for pelvic pain and vaginal bleeding. Negative for dysuria, frequency and urgency.   Physical Exam   Blood pressure (!) 134/56, pulse 79, temperature 98.1 F (36.7 C), temperature source Oral, resp. rate 18, weight 257 lb 4 oz (116.7 kg), last menstrual period 11/19/2016, SpO2 100 %, unknown if currently breastfeeding.  Physical Exam  Nursing note and vitals reviewed. Constitutional: She is oriented to person, place, and time. She appears well-developed and well-nourished. No distress.  HENT:  Head: Normocephalic.  Cardiovascular: Normal rate.  Respiratory: Effort normal.  GI: Soft. There is no tenderness. There is no rebound.  Neurological: She is alert and oriented to person, place, and time.  Skin: Skin is warm and dry.  Psychiatric: She has a normal mood and affect.   Results for orders placed or performed during the hospital encounter of 02/21/17 (from the past 24 hour(s))  Pregnancy, urine POC     Status: Abnormal   Collection Time: 02/21/17  5:41 PM  Result Value Ref Range   Preg Test, Ur POSITIVE (A) NEGATIVE  Wet prep, genital     Status: Abnormal   Collection Time: 02/21/17  6:18 PM  Result Value Ref Range   Yeast Wet Prep HPF POC NONE SEEN NONE SEEN   Trich, Wet Prep NONE SEEN NONE SEEN   Clue Cells Wet Prep HPF POC PRESENT (A) NONE SEEN   WBC, Wet Prep HPF POC MANY (A) NONE SEEN  Sperm NONE SEEN   CBC     Status: None   Collection Time: 02/21/17  6:24 PM  Result Value Ref Range   WBC 9.2 4.0 - 10.5 K/uL   RBC 4.63 3.87 - 5.11 MIL/uL   Hemoglobin 12.3 12.0 - 15.0 g/dL   HCT 16.136.6 09.636.0 - 04.546.0 %   MCV 79.0 78.0 - 100.0 fL   MCH 26.6 26.0 - 34.0 pg   MCHC 33.6 30.0 - 36.0 g/dL   RDW 40.914.5 81.111.5 - 91.415.5 %   Platelets 233 150 - 400 K/uL    [place] Ob Comp Less 14 Wks  Result Date: 02/21/2017 CLINICAL DATA:  Pelvic pain in first trimester pregnancy. Gestational age by last menstrual period 13 weeks and  3 days. EXAM: OBSTETRIC <14 WK ULTRASOUND TECHNIQUE: Transvaginal ultrasound was performed for complete evaluation of the gestation as well as the maternal uterus, adnexal regions, and pelvic cul-de-sac. COMPARISON:  None. FINDINGS: Intrauterine gestational sac: Present Yolk sac:  Not present Embryo:  Present Cardiac Activity: Present Heart Rate: 153 bpm CRL:   73.2  mm   13 w 3 d                  US EDC: August 26, 2017 Subchorionic hemorrhage:  None visualized. Maternal uterus/adnexae: Anterior placenta. Normal appearance of the adnexae. IMPRESSION: Single live intrauterine pregnancy, gestational age by ultrasound 13 weeks and 3 days (concordant dates), no immediate complication. Electronically Signed   By: Awilda Metroourtnay  Bloomer M.D.   On: 02/21/2017 19:04   MAU Course  Procedures  MDM   Assessment and Plan   1. Vaginal bleeding in pregnancy, first trimester   2. [redacted] weeks gestation of pregnancy    DC home Comfort measures reviewed  2nd trimester Trimester precautions  Bleeding precautions RX: none  Return to MAU as needed FU with OB as planned  Follow-up Information    [facility], [facility]   Contact information: [address] [place] [address] [phone]            Thressa ShellerHeather Fonnie Crookshanks 02/21/2017, 6:06 PM

## 2017-02-21 NOTE — Discharge Instructions (Signed)
Prenatal Care Providers [facility]  [phone]     Phone: [place]                      [facility]  @[place]     Phone: [phone]  Phone: [place]     Phone: [phone]  Phone: [facility] [place]                hone: [phone]  Phone: [phone]         [facility] Dr[address] Gracy Racer      Phone: [phone]  Phone: [phone]         [place] [facility]                Phone: [phone]  [place])          Phone: [phone] [facility]   Phone: [phone] Safe Medications in Pregnancy   Acne: Benzoyl Peroxide Salicylic Acid  Backache/Headache: Tylenol: 2 regular strength every 4 hours OR              2 Extra strength every 6 hours  Colds/Coughs/Allergies: Benadryl (alcohol free) 25 mg every 6 hours as needed Breath right strips Claritin Cepacol throat lozenges Chloraseptic throat spray Cold-Eeze- up to three times per day Cough drops, alcohol free Flonase (by prescription only) Guaifenesin Mucinex Robitussin DM (plain only, alcohol free) Saline nasal spray/drops Sudafed (pseudoephedrine) & Actifed ** use only after [redacted] weeks gestation and if you do not have high blood pressure Tylenol Vicks Vaporub Zinc lozenges Zyrtec   Constipation: Colace Ducolax suppositories Fleet enema Glycerin suppositories Metamucil Milk of magnesia Miralax Senokot Smooth move tea  Diarrhea: Kaopectate Imodium A-D  *NO pepto Bismol  Hemorrhoids: Anusol Anusol HC Preparation H Tucks  Indigestion: Tums Maalox Mylanta Zantac  Pepcid  Insomnia: Benadryl (alcohol free) 60m every 6 hours as needed Tylenol  PM Unisom, no Gelcaps  Leg Cramps: Tums MagGel  Nausea/Vomiting:  Bonine Dramamine Emetrol Ginger extract Sea bands Meclizine  Nausea medication to take during pregnancy:  Unisom (doxylamine succinate 25 mg tablets) Take one tablet daily at bedtime. If symptoms are not adequately controlled, the dose can be increased to a maximum recommended dose of two tablets daily (1/2 tablet in the morning, 1/2 tablet mid-afternoon and one at bedtime). Vitamin B6 1073mtablets. Take one tablet twice a day (up to 200 mg per day).  Skin Rashes: Aveeno products Benadryl cream or 2518mvery 6 hours as needed Calamine Lotion 1% cortisone cream  Yeast infection: Gyne-lotrimin 7 Monistat 7   **If taking multiple medications, please check labels to avoid duplicating the same active ingredients **take medication as directed on the label ** Do not exceed 4000 mg of tylenol in 24 hours **Do not take medications that contain aspirin or ibuprofen  Childbirth Education Options: [employer] Classes:  Childbirth education classes can help you get ready for a positive parenting experience. You can also meet other expectant parents and get free stuff for your baby. Each class runs for five weeks on the same  night and costs $45 for the mother-to-be and her support person. Medicaid covers the cost if you are eligible. Call [phone] to register. [facility] Childbirth Education:  [phone] or [phone] or [email or web address]  Baby & Me Class: Discuss newborn & infant parenting and family adjustment issues with other new mothers in a relaxed environment. Each week brings a new speaker or baby-centered activity. We encourage new mothers to join [place] every Thursday at 11:00am. Babies birth until crawling. No registration or fee. Daddy [employer]: This course offers Dads-to-be the tools and knowledge needed to feel confident on their journey to becoming new fathers.  Experienced dads, who have been trained as coaches, teach dads-to-be how to hold, comfort, diaper, swaddle and play with their infant while being able to support the new mom as well. A class for men taught by men. $25/dad Big Brother/Big Sister: Let your children share in the joy of a new brother or sister in this special class designed just for them. Class includes discussion about how families care for babies: swaddling, holding, diapering, safety as well as how they can be helpful in their new role. This class is designed for children ages 57 to 83, but any age is welcome. Please register each child individually. $5/child  Mom Talk: This mom-led group offers support and connection to mothers as they journey through the adjustments and struggles of that sometimes overwhelming first year after the birth of a child. Tuesdays at 10:00am and Thursdays at 6:00pm. Babies welcome. No registration or fee. Breastfeeding Support Group: This group is a mother-to-mother support circle where moms have the opportunity to share their breastfeeding experiences. A Lactation Consultant is present for questions and concerns. Meets each Tuesday at 11:00am. No fee or registration. Breastfeeding Your Baby: Learn what to expect in the first days of breastfeeding your newborn.  This class will help you feel more confident with the skills needed to begin your breastfeeding experience. Many new mothers are concerned about breastfeeding after leaving the hospital. This class will also address the most common fears and challenges about breastfeeding during the first few weeks, months and beyond. (call for fee) Comfort Techniques and Tour: This 2 hour interactive class will provide you the opportunity to learn & practice hands-on techniques that can help relieve some of the discomfort of labor and encourage your baby to rotate toward the best position for birth. You and your partner will be able to try a variety of labor positions with  birth balls and rebozos as well as practice breathing, relaxation, and visualization techniques. A tour of the [facility] is included with this class. $20 per registrant and support person Childbirth Class- Weekend Option: This class is a Weekend version of our Birth & Baby series. It is designed for parents who have a difficult time fitting several weeks of classes into their schedule. It covers the care of your newborn and the basics of labor and childbirth. It also includes a [place] [facility] and lunch. The class is held two consecutive days: beginning on Friday evening from 6:30 - 8:30 p.m. and the next day, Saturday from 9 a.m. - 4 p.m. (call for fee) Doren Custard Class: Interested in a waterbirth?  This informational class will help you discover whether waterbirth is the right fit for you. Education about waterbirth itself, supplies you would need and how to assemble your support team is what you can expect from this class. Some obstetrical practices require this class in  order to pursue a waterbirth. (Not all obstetrical practices offer waterbirth-check with your healthcare provider.) Register only the expectant mom, but you are encouraged to bring your partner to class! Required if planning waterbirth, no fee. Infant/Child CPR: Parents, grandparents, babysitters, and friends learn Cardio-Pulmonary Resuscitation skills for infants and children. You will also learn how to treat both conscious and unconscious choking in infants and children. This Family & Friends program does not offer certification. Register each participant individually to ensure that enough mannequins are available. (Call for fee) Grandparent Love: Expecting a grandbaby? This class is for you! Learn about the latest infant care and safety recommendations and ways to support your own child as he or she transitions into the parenting role. Taught by Registered Nurses who are  childbirth instructors, but most importantly...they are grandmothers too! $10/person. Childbirth Class- Natural Childbirth: This series of 5 weekly classes is for expectant parents who want to learn and practice natural methods of coping with the process of labor and childbirth. Relaxation, breathing, massage, visualization, role of the partner, and helpful positioning are highlighted. Participants learn how to be confident in their body's ability to give birth. This class will empower and help parents make informed decisions about their own care. Includes discussion that will help new parents transition into the immediate postpartum period. [facility] is included. We suggest taking this class between 25-32 weeks, but it's only a recommendation. $75 per registrant and one support person or $30 Medicaid. Childbirth Class- 3 week Series: This option of 3 weekly classes helps you and your labor partner prepare for childbirth. Newborn care, labor & birth, cesarean birth, pain management, and comfort techniques are discussed and a [place] [facility] is included. The class meets at the same time, on the same day of the week for 3 consecutive weeks beginning with the starting date you choose. $60 for registrant and one support person.  Marvelous Multiples: Expecting twins, triplets, or more? This class covers the differences in labor, birth, parenting, and breastfeeding issues that face multiples parents. NICU tour is included. Led by a Certified Childbirth Educator who is the mother of twins. No fee. Caring for Baby: This class is for expectant and adoptive parents who want to learn and practice the most up-to-date newborn care for their babies. Focus is on birth through the first six weeks of life. Topics include feeding, bathing, diapering, crying, umbilical cord care, circumcision care and safe sleep. Parents learn to recognize symptoms of illness and  when to call the pediatrician. Register only the mom-to-be and your partner or support person can plan to come with you! $10 per registrant and support person Childbirth Class- online option: This online class offers you the freedom to complete a Birth and Baby series in the comfort of your own home. The flexibility of this option allows you to review sections at your own pace, at times convenient to you and your support people. It includes additional video information, animations, quizzes, and extended activities. Get organized with helpful eClass tools, checklists, and trackers. Once you register online for the class, you will receive an email within a few days to accept the invitation and begin the class when the time is right for you. The content will be available to you for 60 days. $60 for 60 days of online access for you and your support people.  Local Doulas: Natural Baby Doulas [email or web address] Tel: [phone] [email or web address] [employer] [phone] [email or web address] The Labor  Ladies  (also do [employer]) [phone] [email or web address] Triad Birth Doula [phone] [email or web address] [place] [email or web address]/ [employer] (PADA) [email or web address] [place] Birth and Baby  [email or web address] Considering Waterbirth? Guide for patients at [facility]  Why consider waterbirth?   Gentle birth for babies  Less pain medicine used in labor  May allow for passive descent/less pushing  May reduce perineal tears   More mobility and instinctive maternal position changes  Increased maternal relaxation  Reduced blood pressure in labor  Is waterbirth safe? What are the risks of infection, drowning  or other complications?   Infection: o Very low risk (3.7 % for tub vs 4.8% for bed) o 7 in 8000 waterbirths with documented infection o Poorly cleaned equipment most common cause o Slightly lower group B strep transmission rate   Drowning o Maternal:  - Very low risk   - Related to seizures or fainting o Newborn:  - Very low risk. No evidence of increased risk of respiratory problems in multiple large studies - Physiological protection from breathing under water - Avoid underwater birth if there are any fetal complications - Once babys head is out of the water, keep it out.   Birth complication o Some reports of cord trauma, but risk decreased by bringing baby to surface gradually o No evidence of increased risk of shoulder dystocia. Mothers can usually change positions faster in water than in a bed, possibly aiding the maneuvers to free the shoulder.   You must attend a Doren Custard class at [facility]  3rd Wednesday of every month from 7-9pm  [employer] by calling [phone] or online at [email or web address]  Bring [place] the certificate from the class to your prenatal appointment  Meet with a midwife at 36 weeks to see if you can still plan a waterbirth and to sign the consent.   Purchase or rent the following supplies:   Water Birth Pool (Birth Pool in a Box or Katie for instance)  (Tubs start ~$125)  Single-use disposable tub liner designed for your brand of tub  New garden hose labeled "lead-free", suitable for drinking water",  Electric drain pump to remove water (We recommend 792 gallon per hour or greater pump.)   Separate garden hose to remove the dirty water  Fish net  Bathing suit top (optional)  Long-handled mirror (optional)  Places to purchase or rent supplies  GotWebTools.is for tub purchases and supplies  Waterbirthsolutions.com for tub purchases and supplies  The Labor Ladies (www.thelaborladies.com) $275 for tub  rental/set-up & take down/kit   [employer] (http://www.fleming.com/.htm) Information regarding doulas (labor support) who provide pool rentals  Our practice has a Birth Pool in a Box tub at the hospital that you may borrow on a first-come-first-served basis. It is your responsibility to to set up, clean and break down the tub. We cannot guarantee the availability of this tub in advance. You are responsible for bringing all accessories listed above. If you do not have all necessary supplies you cannot have a waterbirth.    Things that would prevent you from having a waterbirth:  Premature, <37wks  Previous cesarean birth  Presence of thick meconium-stained fluid  Multiple gestation (Twins, triplets, etc.)  Uncontrolled diabetes or gestational diabetes requiring medication  Hypertension requiring medication or diagnosis of pre-eclampsia  Heavy vaginal bleeding  Non-reassuring fetal heart rate  Active infection (MRSA, etc.). Group B Strep is NOT a contraindication for  waterbirth.  If  your labor has to be induced and induction method requires continuous  monitoring of the baby's heart rate  Other risks/issues identified by your obstetrical provider  Please remember that birth is unpredictable. Under certain unforeseeable circumstances your provider may advise against giving birth in the tub. These decisions will be made on a case-by-case basis and with the safety of you and your baby as our highest priority.

## 2017-02-22 LAB — GC/CHLAMYDIA PROBE AMP (~~LOC~~) NOT AT ARMC
Chlamydia: NEGATIVE
Neisseria Gonorrhea: NEGATIVE

## 2017-02-22 LAB — HIV ANTIBODY (ROUTINE TESTING W REFLEX): HIV Screen 4th Generation wRfx: NONREACTIVE

## 2017-02-22 LAB — RPR: RPR Ser Ql: NONREACTIVE

## 2017-03-16 ENCOUNTER — Encounter: Payer: Medicaid Other | Admitting: Obstetrics & Gynecology

## 2017-03-21 NOTE — L&D Delivery Note (Addendum)
Delivery Note [name] is a 22 yo G4 now P3013 s/p SVD at 3282w0d after IOL for post-dates. She was induced with cytotec and FB; at 1711 she was AROM'd with light med-stained fluid and became complete shortly thereafter.  At 6:57 PM a viable female was delivered via Vaginal, Spontaneous (Presentation: LOA).  APGAR: 8,9; weight pending.   After delivery attention was turned to active management of the third stage of labor with gentle traction via Brandt maneuver. Placenta status: delivered spontaneously, intact, 3-vessel cord.    Anesthesia:  epidural Episiotomy:  none Lacerations:  none Suture Repair: n/a Est. Blood Loss (mL):  200 cc  Mom to postpartum.  Baby to Couplet care / Skin to Skin.  GrenadaBrittany P Hipkins 09/02/2017, 7:09 PM  Midwife attestation: I was gloved and present for delivery in its entirety and I agree with the above resident's note.  Donette LarryMelanie Shadia Larose, CNM 8:45 AM

## 2017-04-15 ENCOUNTER — Encounter (HOSPITAL_COMMUNITY): Payer: Self-pay

## 2017-04-15 ENCOUNTER — Inpatient Hospital Stay (HOSPITAL_COMMUNITY)
Admission: AD | Admit: 2017-04-15 | Discharge: 2017-04-15 | Disposition: A | Discharge status: Home / Self Care | Payer: Medicaid Other | Source: Ambulatory Visit | Attending: Obstetrics & Gynecology | Admitting: Obstetrics & Gynecology

## 2017-04-15 ENCOUNTER — Inpatient Hospital Stay (HOSPITAL_COMMUNITY): Payer: Medicaid Other

## 2017-04-15 ENCOUNTER — Other Ambulatory Visit: Payer: Self-pay

## 2017-04-15 DIAGNOSIS — O26872 Cervical shortening, second trimester: Secondary | ICD-10-CM | POA: Diagnosis not present

## 2017-04-15 DIAGNOSIS — R109 Unspecified abdominal pain: Secondary | ICD-10-CM

## 2017-04-15 DIAGNOSIS — B9689 Other specified bacterial agents as the cause of diseases classified elsewhere: Secondary | ICD-10-CM

## 2017-04-15 DIAGNOSIS — Z3A31 31 weeks gestation of pregnancy: Secondary | ICD-10-CM | POA: Diagnosis not present

## 2017-04-15 DIAGNOSIS — O26899 Other specified pregnancy related conditions, unspecified trimester: Secondary | ICD-10-CM

## 2017-04-15 DIAGNOSIS — Z3A21 21 weeks gestation of pregnancy: Secondary | ICD-10-CM | POA: Insufficient documentation

## 2017-04-15 DIAGNOSIS — O23592 Infection of other part of genital tract in pregnancy, second trimester: Secondary | ICD-10-CM | POA: Diagnosis not present

## 2017-04-15 DIAGNOSIS — O368120 Decreased fetal movements, second trimester, not applicable or unspecified: Secondary | ICD-10-CM | POA: Diagnosis not present

## 2017-04-15 DIAGNOSIS — N760 Acute vaginitis: Secondary | ICD-10-CM

## 2017-04-15 HISTORY — DX: Urinary tract infection, site not specified: N39.0

## 2017-04-15 LAB — URINALYSIS, ROUTINE W REFLEX MICROSCOPIC
Bilirubin Urine: NEGATIVE
GLUCOSE, UA: NEGATIVE mg/dL
HGB URINE DIPSTICK: NEGATIVE
KETONES UR: NEGATIVE mg/dL
NITRITE: NEGATIVE
PH: 7 (ref 5.0–8.0)
PROTEIN: NEGATIVE mg/dL
Specific Gravity, Urine: 1.013 (ref 1.005–1.030)

## 2017-04-15 LAB — WET PREP, GENITAL
Sperm: NONE SEEN
Trich, Wet Prep: NONE SEEN
Yeast Wet Prep HPF POC: NONE SEEN

## 2017-04-15 MED ORDER — PROGESTERONE MICRONIZED 200 MG PO CAPS: ORAL_CAPSULE | ORAL | 3 refills | Status: DC

## 2017-04-15 MED ORDER — METRONIDAZOLE 500 MG PO TABS: 500.0000 mg | ORAL_TABLET | Freq: Two times a day (BID) | ORAL | 0 refills | Status: DC

## 2017-04-15 NOTE — Discharge Instructions (Signed)

## 2017-04-15 NOTE — MAU Note (Signed)
Pt reports that she has not felt the baby move today. Pt states that she is feeling week, has blurry vision and feels dehydrated. Pt reports that she vomited once today and usually vomits about 1-2 times per day. Does not have medicine for nausea at home. Her prenatal visit is on wed, (first visit was missed due to snow). Pt also reports that she is cramping and has some yellow, foul smelling discharge.

## 2017-04-15 NOTE — MAU Provider Note (Signed)
History     CSN: [id number]  Arrival date and time: 04/15/17 2008   First Provider Initiated Contact with Patient 04/15/17 2043      Chief Complaint  Patient presents with  . Decreased Fetal Movement  . Abdominal Pain  . Vaginal Discharge   HPI [name] Mauck is a 22 y.o. [id number] at 5821w0d who presents with abdominal cramping, vaginal discharge, & decreased fetal movement. Reports decreased movement today. States she normally feels movement every time she eats (hourly) but hasn't felt baby move since this morning. Endorses yellow foul smelling vaginal discharge for the last several days. Denies itching/irritation or vaginal bleeding. Lower abdominal cramping since yesterday that has progressively worsened today. Rates pain 4/10. No recent intercourse. Denies dysuria or diarrhea. Daily n/v; normally vomits twice per day. Has vomited once today; currently not nauseated.  OB History    Gravida Para Term Preterm AB Living   4 2 2  0 1 2   SAB TAB Ectopic Multiple Live Births   1 0 0 0 2      Obstetric Comments   03/12/14 SAB 12 wks      Past Medical History:  Diagnosis Date  . Obesity   . UTI (urinary tract infection)     Past Surgical History:  Procedure Laterality Date  . EXTERNAL EAR SURGERY Bilateral 2017   keloid removal  . keloid     removal on ear    Family History  Problem Relation Age of Onset  . Cancer Paternal Grandfather   . Diabetes Father   . Hypertension Father     Social History   Tobacco Use  . Smoking status: Never Smoker  . Smokeless tobacco: Never Used  Substance Use Topics  . Alcohol use: No  . Drug use: No    Allergies: No Known Allergies  Medications Prior to Admission  Medication Sig Dispense Refill Last Dose  . acetaminophen (TYLENOL) 500 MG tablet Take 500 mg by mouth every 6 (six) hours as needed for mild pain.   02/21/2017 at Unknown time    Review of Systems  Constitutional: Negative.   Gastrointestinal: Positive for abdominal  pain, nausea and vomiting. Negative for constipation and diarrhea.  Genitourinary: Positive for vaginal discharge. Negative for vaginal bleeding.   Physical Exam   Blood pressure 108/65, pulse (!) 113, temperature 97.6 F (36.4 C), resp. rate 18, height 5' 3.5" (1.613 m), weight 258 lb 12 oz (117.4 kg), last menstrual period 11/19/2016, SpO2 100 %, unknown if currently breastfeeding.  Physical Exam  Nursing note and vitals reviewed. Constitutional: She is oriented to person, place, and time. She appears well-developed and well-nourished. No distress.  HENT:  Head: Normocephalic and atraumatic.  Eyes: Conjunctivae are normal. Right eye exhibits no discharge. Left eye exhibits no discharge. No scleral icterus.  Neck: Normal range of motion.  Respiratory: Effort normal. No respiratory distress.  GI: Soft. There is no tenderness.  Genitourinary: No bleeding in the vagina. Vaginal discharge (small amount of yellow mucoid discharge) found.  Neurological: She is alert and oriented to person, place, and time.  Skin: Skin is warm and dry. She is not diaphoretic.  Psychiatric: She has a normal mood and affect. Her behavior is normal. Judgment and thought content normal.    MAU Course  Procedures Results for orders placed or performed during the hospital encounter of 04/15/17 (from the past 24 hour(s))  Urinalysis, Routine w reflex microscopic     Status: Abnormal   Collection Time: 04/15/17  8:38  PM  Result Value Ref Range   Color, Urine YELLOW YELLOW   APPearance HAZY (A) CLEAR   Specific Gravity, Urine 1.013 1.005 - 1.030   pH 7.0 5.0 - 8.0   Glucose, UA NEGATIVE NEGATIVE mg/dL   Hgb urine dipstick NEGATIVE NEGATIVE   Bilirubin Urine NEGATIVE NEGATIVE   Ketones, ur NEGATIVE NEGATIVE mg/dL   Protein, ur NEGATIVE NEGATIVE mg/dL   Nitrite NEGATIVE NEGATIVE   Leukocytes, UA MODERATE (A) NEGATIVE   RBC / HPF 0-5 0 - 5 RBC/hpf   WBC, UA 0-5 0 - 5 WBC/hpf   Bacteria, UA RARE (A) NONE SEEN    Squamous Epithelial / LPF 6-30 (A) NONE SEEN   Mucus PRESENT   Wet prep, genital     Status: Abnormal   Collection Time: 04/15/17  8:50 PM  Result Value Ref Range   Yeast Wet Prep HPF POC NONE SEEN NONE SEEN   Trich, Wet Prep NONE SEEN NONE SEEN   Clue Cells Wet Prep HPF POC PRESENT (A) NONE SEEN   WBC, Wet Prep HPF POC MODERATE (A) NONE SEEN   Sperm NONE SEEN   No results found.   MDM FHT 153 VSS, NAD Wet prep + clues, will tx for BV U/a inconclusive, urine culture sent SVE ext 0.5 -- TVUS ordered for CL Ultrasound -- CL 2.4 cm C/w Dr. Macon Large. Will d/c home with vaginal progesterone.  Assessment and Plan  A: 1. Short cervical length during pregnancy in second trimester   2. [redacted] weeks gestation of pregnancy   3. Abdominal pain affecting pregnancy   4. BV (bacterial vaginosis)    P: Discharge home Rx flagyl & vaginal progesterone Keep ob appt this week -- will set up for weekly cervical lengths Discussed reasons to return to MAU GC/CT & urine culture pending  Judeth Horn 04/15/2017, 8:44 PM

## 2017-04-17 LAB — GC/CHLAMYDIA PROBE AMP (~~LOC~~) NOT AT ARMC
CHLAMYDIA, DNA PROBE: NEGATIVE
Neisseria Gonorrhea: NEGATIVE

## 2017-04-18 ENCOUNTER — Telehealth: Payer: Self-pay | Admitting: Student

## 2017-04-18 ENCOUNTER — Encounter: Payer: Self-pay | Admitting: Student

## 2017-04-18 DIAGNOSIS — R8271 Bacteriuria: Secondary | ICD-10-CM | POA: Insufficient documentation

## 2017-04-18 LAB — CULTURE, OB URINE: Culture: 20000 — AB

## 2017-04-18 NOTE — Telephone Encounter (Signed)
Patient called and left message on nurse line stating she is returning our call. Called patient and informed her of ultrasound results & she can d/c taking progesterone. Also informed her of UTI and we would be sending in a Rx for that to her pharmacy. Recommended she call her pharmacy later today to see if Rx is there. Patient verbalized understanding to all & asked when she would be getting an ultrasound of the baby. Told patient we will schedule that appt when she is here tomorrow. Patient verbalized understanding to all & had no questions

## 2017-04-18 NOTE — Telephone Encounter (Signed)
Attempted to contact patient regarding finalized ultrasound reports & urine culture results. Needs tx for GBS in urine. Also should discontinue vaginal progesterone as finalized u/s reports shows normal cervical length.  Left voicemail to return call.   Judeth HornLawrence, Jasmon Mattice, NP

## 2017-04-18 NOTE — Telephone Encounter (Signed)
Calling test results

## 2017-04-19 ENCOUNTER — Encounter: Payer: Medicaid Other | Admitting: Advanced Practice Midwife

## 2017-04-19 ENCOUNTER — Telehealth: Payer: Self-pay | Admitting: Advanced Practice Midwife

## 2017-04-19 ENCOUNTER — Other Ambulatory Visit: Payer: Self-pay | Admitting: Advanced Practice Midwife

## 2017-04-19 DIAGNOSIS — Z3A21 21 weeks gestation of pregnancy: Secondary | ICD-10-CM

## 2017-04-19 MED ORDER — CEPHALEXIN 500 MG PO CAPS: 500.0000 mg | ORAL_CAPSULE | Freq: Four times a day (QID) | ORAL | 0 refills | Status: DC

## 2017-04-19 NOTE — Telephone Encounter (Signed)
Pt urine positive for GBS UTI.  I see note from 04/18/16 that pt was informed of UTI and Rx sent but I do not see Rx so [facility] [address]  Called pt about UTI and told her to take one antibiotic for UTI only if she receives two.  Pt states understanding.  She asks about her anatomy US which has not been ordered. She missed her scheduled OB appt today and rescheduled for 05/04/17.  Outpatient anatomy US ordered.

## 2017-04-20 ENCOUNTER — Encounter: Payer: Self-pay | Admitting: General Practice

## 2017-04-24 ENCOUNTER — Other Ambulatory Visit: Payer: Self-pay | Admitting: Advanced Practice Midwife

## 2017-04-24 ENCOUNTER — Ambulatory Visit (HOSPITAL_COMMUNITY)
Admission: RE | Admit: 2017-04-24 | Discharge: 2017-04-24 | Disposition: A | Discharge status: Home / Self Care | Payer: Medicaid Other | Source: Ambulatory Visit | Attending: Advanced Practice Midwife | Admitting: Advanced Practice Midwife

## 2017-04-24 DIAGNOSIS — Z3A22 22 weeks gestation of pregnancy: Secondary | ICD-10-CM | POA: Insufficient documentation

## 2017-04-24 DIAGNOSIS — Z363 Encounter for antenatal screening for malformations: Secondary | ICD-10-CM | POA: Diagnosis not present

## 2017-04-24 DIAGNOSIS — O99212 Obesity complicating pregnancy, second trimester: Secondary | ICD-10-CM | POA: Insufficient documentation

## 2017-04-24 DIAGNOSIS — Z6841 Body Mass Index (BMI) 40.0 and over, adult: Secondary | ICD-10-CM | POA: Insufficient documentation

## 2017-04-24 DIAGNOSIS — Z3A21 21 weeks gestation of pregnancy: Secondary | ICD-10-CM

## 2017-04-28 ENCOUNTER — Encounter: Payer: Self-pay | Admitting: Advanced Practice Midwife

## 2017-04-28 DIAGNOSIS — Z3490 Encounter for supervision of normal pregnancy, unspecified, unspecified trimester: Secondary | ICD-10-CM

## 2017-04-28 HISTORY — DX: Encounter for supervision of normal pregnancy, unspecified, unspecified trimester: Z34.90

## 2017-05-04 ENCOUNTER — Encounter: Payer: Medicaid Other | Admitting: Student

## 2017-05-08 ENCOUNTER — Ambulatory Visit (INDEPENDENT_AMBULATORY_CARE_PROVIDER_SITE_OTHER): Payer: Medicaid Other | Admitting: Advanced Practice Midwife

## 2017-05-08 DIAGNOSIS — Z3480 Encounter for supervision of other normal pregnancy, unspecified trimester: Secondary | ICD-10-CM

## 2017-05-08 DIAGNOSIS — Z3481 Encounter for supervision of other normal pregnancy, first trimester: Secondary | ICD-10-CM

## 2017-05-08 NOTE — Progress Notes (Signed)
Pt stated having cramping but not much pain

## 2017-05-08 NOTE — Progress Notes (Signed)
  Subjective:    [name] is being seen today for her first obstetrical visit. She was seen at the [facility] up to now. She denies any complications at this time. This is not a planned pregnancy. She is at 5351w2d gestation. Her obstetrical history is significant for obesity. Relationship with FOB: spouse, living together. Patient does intend to breast feed. Pregnancy history fully reviewed.  Patient reports no complaints.  Review of Systems:   Review of Systems  Objective:     BP (!) 111/51   Pulse 67   Wt 259 lb (117.5 kg)   LMP 11/19/2016   BMI 45.16 kg/m  Physical Exam  Nursing note and vitals reviewed. Constitutional: She appears well-developed and well-nourished. No distress.  HENT:  Head: Normocephalic.  Cardiovascular: Normal rate.  Respiratory: Effort normal.  GI: Soft. There is no tenderness.  Neurological: She is alert.  Skin: Skin is warm and dry.  Psychiatric: She has a normal mood and affect.    Maternal Exam:  Abdomen: Fundal height is 25cm.       Fetal Exam Fetal Monitor Review: Mode: hand-held doppler probe.   Baseline rate: 148.         Assessment:    Pregnancy: [id number] Patient Active Problem List   Diagnosis Date Noted  . Supervision of normal pregnancy 04/28/2017  . GBS bacteriuria 04/18/2017  . Gallbladder stone without cholecystitis or obstruction 02/09/2015  . Sickle cell trait (HCC) 01/10/2015       Plan:     Due for GTT at next visit. Will get records from [facility], and add on anything needed at [facility].  Prenatal vitamins. Problem list reviewed and updated. AFP3 discussed: too late . Role of ultrasound in pregnancy discussed; fetal survey: requested. Amniocentesis discussed: not indicated. Follow up in 4 weeks. 50% of 45 min visit spent on counseling and coordination of care.     Beverly Anthony 05/08/2017

## 2017-05-08 NOTE — Patient Instructions (Signed)
AREA PEDIATRIC/FAMILY [place] [address], [address] [place], [place]  [address] Phone - [phone]   Fax - [phone]  [facility] [place] [place] [address] [place], [place] [address] Phone - [phone]   Fax - [place] [address] [place], [place]  [address] Phone - [phone]   Fax - [phone]  [place]. [address] [place] [address] [place], [place]  [address] Phone - [phone]   Fax - [phone]  [place] [address] [place], [place]  [address] Phone - [phone]   Fax - [phone]  [facility] [address] [place], [place]  [address] Phone - [phone]   Fax - [place] [address] [place], [place]  [address] Phone - [phone]   Fax - [phone]  [place] [address] [place], [place] [address] [place], [place]  [address] Phone - [phone]   Fax - [place] [address] [place], [place]  [address] Phone - [phone]   Fax - [phone] [facility] [place]. [address] [place], [place]  [address] Phone - [phone]   Fax - [phone]  [place] [address] [place], [place]  [address] Phone - [phone]   Fax - [phone]  [facility] [place], [place], [place]  [address] Phone - [phone]   Fax - [place] [address] [place], [place]  [address] Phone - [phone]   Fax - [phone]  [facility] [address] [place], [place]  [address] Phone - [place], [place]  [address] Phone - [phone]   Fax - [place] [address] [place], [place]  [address] Phone - [phone]   Fax - [phone]  [place] [address] [place], [place]  [address] Phone - [phone]   Fax - [place]. [place], [place]  [address] Phone - [phone]   Fax - [place], [place], [place]  [address] Phone - [phone]   Fax - [place] [address], [place], [place]  [address] Phone - [phone]   Fax - [phone]  [place] [address] [place], [place]  [address] Phone - [phone]   Fax - [place] [address] [place], [place]  [address] Phone - [phone]   Fax - [phone]  [place] [address] [place], [place]  [address] Phone - [phone]   Fax - [phone] [place] [address] [place], [place]  [address] Phone - [phone]   Fax - [place] [address] [place], [place]  [address] Phone - [phone]   Fax - [place] [address] [place], [place]  [address] Phone - [phone]   Fax - [phone]  [place] [address] [place] [address] Phone [phone]  Fax [phone]  Places to have your son circumcised:    [facility] [phone] while you  are in hospital  [facility] [phone] $244 by 4 wks  [facility] [phone] $175 by 2 wks  [place] [phone] $250 by 7 days [facility] [phone] $269 by 4 wks  These prices sometimes change but are roughly what you can expect to pay. Please  call and confirm pricing.   Circumcision is considered an elective/non-medically necessary procedure. There are many reasons parents decide to have their sons circumsized. During the first year of life circumcised males have a reduced risk of urinary tract infections but after this year the rates between circumcised males and uncircumcised males are the same.  It is safe to have your son circumcised outside of the hospital and the places above perform them regularly.   Deciding about Circumcision in Baby Boys  (Up-to-date The Basics)  What is circumcision?  Circumcision is a surgery that removes the skin that covers the tip of the penis, called the "foreskin" Circumcision is usually done when a boy is between 42 and 85 days old. In the [place], circumcision is common. In some other countries, fewer boys are circumcised. Circumcision is a common tradition in some religions.  Should I have my baby boy circumcised?  There is no easy answer. Circumcision has some benefits. But it also has risks. After talking with your doctor, you will have to decide for yourself what is right for your family.  What are the benefits of circumcision?  Circumcised boys seem to have slightly lower rates of: ?Urinary tract infections ?Swelling of the opening at the tip of the penis Circumcised men seem to have slightly lower rates of: ?Urinary tract infections ?Swelling of the opening at the tip of the penis ?Penis cancer ?HIV and other infections that you catch during sex ?Cervical cancer in the women they have sex with Even so, in the [place], the risks of these problems are small - even in boys and men who have not been circumcised. Plus, boys and men who are not circumcised can reduce these extra risks by: ?Cleaning their penis well ?Using condoms during sex  What are the risks of circumcision?  Risks include: ?Bleeding or infection from the surgery ?Damage to or amputation of the  penis ?A chance that the doctor will cut off too much or not enough of the foreskin ?A chance that sex won't feel as good later in life Only about 1 out of every 200 circumcisions leads to problems. There is also a chance that your health insurance won't pay for circumcision.  How is circumcision done in baby boys?  First, the baby gets medicine for pain relief. This might be a cream on the skin or a shot into the base of the penis. Next, the doctor cleans the baby's penis well. Then he or she uses special tools to cut off the foreskin. Finally, the doctor wraps a bandage (called gauze) around the baby's penis. If you have your baby circumcised, his doctor or nurse will give you instructions on how to care for him after the surgery. It is important that you follow those instructions carefully. Childbirth Education Options: [facility] Department Classes:  Childbirth education classes can help you get ready for a positive parenting experience. You can also meet other expectant parents and get free stuff for your baby. Each class runs for five weeks on the same night and costs $45 for the mother-to-be and her support person. Medicaid covers the cost if you are eligible. Call [phone] to register. [facility] Childbirth Education:  [phone]  or [phone] or [email or web address]  Baby & Me Class: Discuss newborn & infant parenting and family adjustment issues with other new mothers in a relaxed environment. Each week brings a new speaker or baby-centered activity. We encourage new mothers to join [place] every Thursday at 11:00am. Babies birth until crawling. No registration or fee. Daddy [employer]: This course offers Dads-to-be the tools and knowledge needed to feel confident on their journey to becoming new fathers. Experienced dads, who have been trained as coaches, teach dads-to-be how to hold, comfort, diaper, swaddle and play with their infant while being able to support  the new mom as well. A class for men taught by men. $25/dad Big Brother/Big Sister: Let your children share in the joy of a new brother or sister in this special class designed just for them. Class includes discussion about how families care for babies: swaddling, holding, diapering, safety as well as how they can be helpful in their new role. This class is designed for children ages 28 to 39, but any age is welcome. Please register each child individually. $5/child  Mom Talk: This mom-led group offers support and connection to mothers as they journey through the adjustments and struggles of that sometimes overwhelming first year after the birth of a child. Tuesdays at 10:00am and Thursdays at 6:00pm. Babies welcome. No registration or fee. Breastfeeding Support Group: This group is a mother-to-mother support circle where moms have the opportunity to share their breastfeeding experiences. A Lactation Consultant is present for questions and concerns. Meets each Tuesday at 11:00am. No fee or registration. Breastfeeding Your Baby: Learn what to expect in the first days of breastfeeding your newborn.  This class will help you feel more confident with the skills needed to begin your breastfeeding experience. Many new mothers are concerned about breastfeeding after leaving the hospital. This class will also address the most common fears and challenges about breastfeeding during the first few weeks, months and beyond. (call for fee) Comfort Techniques and Tour: This 2 hour interactive class will provide you the opportunity to learn & practice hands-on techniques that can help relieve some of the discomfort of labor and encourage your baby to rotate toward the best position for birth. You and your partner will be able to try a variety of labor positions with birth balls and rebozos as well as practice breathing, relaxation, and visualization techniques. A tour of the [facility] is included  with this class. $20 per registrant and support person Childbirth Class- Weekend Option: This class is a Weekend version of our Birth & Baby series. It is designed for parents who have a difficult time fitting several weeks of classes into their schedule. It covers the care of your newborn and the basics of labor and childbirth. It also includes a [place] [facility] and lunch. The class is held two consecutive days: beginning on Friday evening from 6:30 - 8:30 p.m. and the next day, Saturday from 9 a.m. - 4 p.m. (call for fee) Doren Custard Class: Interested in a waterbirth?  This informational class will help you discover whether waterbirth is the right fit for you. Education about waterbirth itself, supplies you would need and how to assemble your support team is what you can expect from this class. Some obstetrical practices require this class in order to pursue a waterbirth. (Not all obstetrical practices offer waterbirth-check with your healthcare provider.) Register only the expectant mom, but you are encouraged to bring your partner to  class! Required if planning waterbirth, no fee. Infant/Child CPR: Parents, grandparents, babysitters, and friends learn Cardio-Pulmonary Resuscitation skills for infants and children. You will also learn how to treat both conscious and unconscious choking in infants and children. This Family & Friends program does not offer certification. Register each participant individually to ensure that enough mannequins are available. (Call for fee) Grandparent Love: Expecting a grandbaby? This class is for you! Learn about the latest infant care and safety recommendations and ways to support your own child as he or she transitions into the parenting role. Taught by Registered Nurses who are childbirth instructors, but most importantly...they are grandmothers too! $10/person. Childbirth Class- Natural Childbirth: This series of 5 weekly classes is for  expectant parents who want to learn and practice natural methods of coping with the process of labor and childbirth. Relaxation, breathing, massage, visualization, role of the partner, and helpful positioning are highlighted. Participants learn how to be confident in their body's ability to give birth. This class will empower and help parents make informed decisions about their own care. Includes discussion that will help new parents transition into the immediate postpartum period. [facility] is included. We suggest taking this class between 25-32 weeks, but it's only a recommendation. $75 per registrant and one support person or $30 Medicaid. Childbirth Class- 3 week Series: This option of 3 weekly classes helps you and your labor partner prepare for childbirth. Newborn care, labor & birth, cesarean birth, pain management, and comfort techniques are discussed and a [place] [facility] is included. The class meets at the same time, on the same day of the week for 3 consecutive weeks beginning with the starting date you choose. $60 for registrant and one support person.  Marvelous Multiples: Expecting twins, triplets, or more? This class covers the differences in labor, birth, parenting, and breastfeeding issues that face multiples' parents. NICU tour is included. Led by a Certified Childbirth Educator who is the mother of twins. No fee. Caring for Baby: This class is for expectant and adoptive parents who want to learn and practice the most up-to-date newborn care for their babies. Focus is on birth through the first six weeks of life. Topics include feeding, bathing, diapering, crying, umbilical cord care, circumcision care and safe sleep. Parents learn to recognize symptoms of illness and when to call the pediatrician. Register only the mom-to-be and your partner or support person can plan to come with you! $10 per registrant and support  person Childbirth Class- online option: This online class offers you the freedom to complete a Birth and Baby series in the comfort of your own home. The flexibility of this option allows you to review sections at your own pace, at times convenient to you and your support people. It includes additional video information, animations, quizzes, and extended activities. Get organized with helpful eClass tools, checklists, and trackers. Once you register online for the class, you will receive an email within a few days to accept the invitation and begin the class when the time is right for you. The content will be available to you for 60 days. $60 for 60 days of online access for you and your support people.  Local Doulas: Natural Baby Doulas [email or web address] Tel: [phone] [email or web address] [employer] [phone] [email or web address] .[email or web address] The Labor Hassell Halim  (also do waterbirth tub rental) [phone] [email or web address] Triad Birth Doula [phone] [email or web address] NotebookDistributors.fi Sacred Rhythms  [phone] https://[email or web address]-[email or web address]/ [employer] (PADA) [email or web address] https://www.frey.org/ [place]  [place]ella Birth and Baby  http://[email or web address]/ Considering Waterbirth? Guide for patients at [facility] [employer]  Why consider waterbirth?  . Gentle birth for babies . Less pain medicine used in labor . May allow for passive descent/less pushing . May reduce perineal tears  . More mobility and instinctive maternal position changes . Increased maternal relaxation . Reduced blood pressure in labor  Is waterbirth safe? What are the risks of infection, drowning or other complications?  . Infection: o Very low risk (3.7 % for tub vs 4.8% for bed) o 7 in 8000 waterbirths with documented infection o Poorly  cleaned equipment most common cause o Slightly lower group B strep transmission rate  . Drowning o Maternal:  - Very low risk   - Related to seizures or fainting o Newborn:  - Very low risk. No evidence of increased risk of respiratory problems in multiple large studies - Physiological protection from breathing under water - Avoid underwater birth if there are any fetal complications - Once baby's head is out of the water, keep it out.  . Birth complication o Some reports of cord trauma, but risk decreased by bringing baby to surface gradually o No evidence of increased risk of shoulder dystocia. Mothers can usually change positions faster in water than in a bed, possibly aiding the maneuvers to free the shoulder.   You must attend a Doren Custard class at [facility]  3rd Wednesday of every month from 7-9pm  [employer] by calling [phone] or online at [email or web address]  Bring [place] the certificate from the class to your prenatal appointment  Meet with a midwife at 36 weeks to see if you can still plan a waterbirth and to sign the consent.   Purchase or rent the following supplies:   Water Birth Pool (Birth Pool in a Box or [place] for instance)  (Tubs start ~$125)  Single-use disposable tub liner designed for your brand of tub  New garden hose labeled "lead-free", "suitable for drinking water",  Electric drain pump to remove water (We recommend 792 gallon per hour or greater pump.)   Separate garden hose to remove the dirty water  Fish net  Bathing suit top (optional)  Long-handled mirror (optional)  Places to purchase or rent supplies  GotWebTools.is for tub purchases and supplies  Waterbirthsolutions.com for tub purchases and supplies  The Labor Ladies (www.thelaborladies.com) $275 for tub rental/set-up & take down/kit   [employer] (http://www.fleming.com/.htm) Information regarding doulas (labor support) who  provide pool rentals  Our practice has a Birth Pool in a Box tub at the hospital that you may borrow on a first-come-first-served basis. It is your responsibility to to set up, clean and break down the tub. We cannot guarantee the availability of this tub in advance. You are responsible for bringing all accessories listed above. If you do not have all necessary supplies you cannot have a waterbirth.    Things that would prevent you from having a waterbirth:  Premature, <37wks  Previous cesarean birth  Presence of thick meconium-stained fluid  Multiple gestation (Twins, triplets, etc.)  Uncontrolled diabetes or gestational diabetes requiring medication  Hypertension requiring medication or diagnosis of pre-eclampsia  Heavy vaginal bleeding  Non-reassuring fetal heart rate  Active infection (MRSA, etc.). Group B Strep is NOT a contraindication for  waterbirth.  If your labor has to be induced and induction method requires continuous  monitoring of the baby's heart rate  Other risks/issues identified by your obstetrical provider  Please remember  that birth is unpredictable. Under certain unforeseeable circumstances your provider may advise against giving birth in the tub. These decisions will be made on a case-by-case basis and with the safety of you and your baby as our highest priority.   Safe Medications in Pregnancy   Acne: Benzoyl Peroxide Salicylic Acid  Backache/Headache: Tylenol: 2 regular strength every 4 hours OR              2 Extra strength every 6 hours  Colds/Coughs/Allergies: Benadryl (alcohol free) 25 mg every 6 hours as needed Breath right strips Claritin Cepacol throat lozenges Chloraseptic throat spray Cold-Eeze- up to three times per day Cough drops, alcohol free Flonase (by prescription only) Guaifenesin Mucinex Robitussin DM (plain only, alcohol free) Saline nasal spray/drops Sudafed (pseudoephedrine) & Actifed ** use only after [redacted] weeks  gestation and if you do not have high blood pressure Tylenol Vicks Vaporub Zinc lozenges Zyrtec   Constipation: Colace Ducolax suppositories Fleet enema Glycerin suppositories Metamucil Milk of magnesia Miralax Senokot Smooth move tea  Diarrhea: Kaopectate Imodium A-D  *NO pepto Bismol  Hemorrhoids: Anusol Anusol HC Preparation H Tucks  Indigestion: Tums Maalox Mylanta Zantac  Pepcid  Insomnia: Benadryl (alcohol free) 38m every 6 hours as needed Tylenol PM Unisom, no Gelcaps  Leg Cramps: Tums MagGel  Nausea/Vomiting:  [place] Dramamine Emetrol Ginger extract Sea bands Meclizine  Nausea medication to take during pregnancy:  Unisom (doxylamine succinate 25 mg tablets) Take one tablet daily at bedtime. If symptoms are not adequately controlled, the dose can be increased to a maximum recommended dose of two tablets daily (1/2 tablet in the morning, 1/2 tablet mid-afternoon and one at bedtime). Vitamin B6 1044mtablets. Take one tablet twice a day (up to 200 mg per day).  Skin Rashes: Aveeno products Benadryl cream or 2586mvery 6 hours as needed Calamine Lotion 1% cortisone cream  Yeast infection: Gyne-lotrimin 7 Monistat 7   **If taking multiple medications, please check labels to avoid duplicating the same active ingredients **take medication as directed on the label ** Do not exceed 4000 mg of tylenol in 24 hours **Do not take medications that contain aspirin or ibuprofen

## 2017-05-15 ENCOUNTER — Ambulatory Visit (HOSPITAL_COMMUNITY): Payer: Medicaid Other

## 2017-05-17 ENCOUNTER — Ambulatory Visit (HOSPITAL_COMMUNITY)
Admission: RE | Admit: 2017-05-17 | Discharge: 2017-05-17 | Disposition: A | Discharge status: Home / Self Care | Payer: Medicaid Other | Source: Ambulatory Visit | Attending: Advanced Practice Midwife | Admitting: Advanced Practice Midwife

## 2017-05-17 ENCOUNTER — Other Ambulatory Visit: Payer: Self-pay | Admitting: Advanced Practice Midwife

## 2017-05-17 DIAGNOSIS — Z3A25 25 weeks gestation of pregnancy: Secondary | ICD-10-CM | POA: Diagnosis not present

## 2017-05-17 DIAGNOSIS — Z362 Encounter for other antenatal screening follow-up: Secondary | ICD-10-CM | POA: Diagnosis not present

## 2017-05-17 DIAGNOSIS — O99210 Obesity complicating pregnancy, unspecified trimester: Secondary | ICD-10-CM

## 2017-05-17 DIAGNOSIS — O99212 Obesity complicating pregnancy, second trimester: Secondary | ICD-10-CM | POA: Insufficient documentation

## 2017-05-17 DIAGNOSIS — E669 Obesity, unspecified: Secondary | ICD-10-CM | POA: Insufficient documentation

## 2017-05-17 DIAGNOSIS — Z3480 Encounter for supervision of other normal pregnancy, unspecified trimester: Secondary | ICD-10-CM

## 2017-06-05 ENCOUNTER — Telehealth: Payer: Self-pay | Admitting: Advanced Practice Midwife

## 2017-06-05 ENCOUNTER — Other Ambulatory Visit: Payer: Medicaid Other

## 2017-06-05 ENCOUNTER — Encounter: Payer: Medicaid Other | Admitting: Advanced Practice Midwife

## 2017-06-05 NOTE — Telephone Encounter (Signed)
Patient called about her missed appointments. I informed patient thru MyChart about rescheduled appointments. Patient stated she understood, and was fine with the new appointment.

## 2017-06-19 ENCOUNTER — Ambulatory Visit (INDEPENDENT_AMBULATORY_CARE_PROVIDER_SITE_OTHER): Payer: Medicaid Other | Admitting: Advanced Practice Midwife

## 2017-06-19 ENCOUNTER — Encounter: Payer: Self-pay | Admitting: *Deleted

## 2017-06-19 ENCOUNTER — Other Ambulatory Visit: Payer: Self-pay

## 2017-06-19 ENCOUNTER — Other Ambulatory Visit: Payer: Medicaid Other

## 2017-06-19 VITALS — BP 114/61 | HR 89 | Wt 257.4 lb

## 2017-06-19 DIAGNOSIS — Z3480 Encounter for supervision of other normal pregnancy, unspecified trimester: Secondary | ICD-10-CM

## 2017-06-19 DIAGNOSIS — Z23 Encounter for immunization: Secondary | ICD-10-CM | POA: Diagnosis not present

## 2017-06-19 DIAGNOSIS — Z3483 Encounter for supervision of other normal pregnancy, third trimester: Secondary | ICD-10-CM

## 2017-06-19 NOTE — Progress Notes (Signed)
Tdap given on 06/19/17 ibn left arm @ 9:21

## 2017-06-19 NOTE — Progress Notes (Signed)
   PRENATAL VISIT NOTE  Subjective:  [name] is a 22 y.o. [phone] at 8061w2d being seen today for ongoing prenatal care.  She is currently monitored for the following issues for this low-risk pregnancy and has Sickle cell trait (HCC); Gallbladder stone without cholecystitis or obstruction; GBS bacteriuria; and Supervision of normal pregnancy on their problem list.  Patient reports no complaints.   . Vag. Bleeding: None.  Movement: Present. Denies leaking of fluid.   The following portions of the patient's history were reviewed and updated as appropriate: allergies, current medications, past family history, past medical history, past social history, past surgical history and problem list. Problem list updated.  Objective:   Vitals:   06/19/17 0912  BP: 114/61  Pulse: 89  Weight: 257 lb 6.4 oz (116.8 kg)    Fetal Status: Fetal Heart Rate (bpm): 154 Fundal Height: 31 cm Movement: Present     General:  Alert, oriented and cooperative. Patient is in no acute distress.  Skin: Skin is warm and dry. No rash noted.   Cardiovascular: Normal heart rate noted  Respiratory: Normal respiratory effort, no problems with respiration noted  Abdomen: Soft, gravid, appropriate for gestational age.  Pain/Pressure: Absent     Pelvic: Cervical exam deferred        Extremities: Normal range of motion.  Edema: None  Mental Status: Normal mood and affect. Normal behavior. Normal judgment and thought content.   Assessment and Plan:  Pregnancy: [id number] at 5961w2d  1. Supervision of other normal pregnancy, antepartum - Hepatitis B Surface AntiGEN - Rubella screen - 2 hour GTT - CBC - RPR  Desires BTL, papers signed today   Preterm labor symptoms and general obstetric precautions including but not limited to vaginal bleeding, contractions, leaking of fluid and fetal movement were reviewed in detail with the patient. Please refer to After Visit Summary for other counseling recommendations.  Return in  about 2 weeks (around 07/03/2017).   Thressa ShellerHeather Hogan, CNM

## 2017-06-19 NOTE — Patient Instructions (Signed)
Contraception Choices Contraception, also called birth control, refers to methods or devices that prevent pregnancy. Hormonal methods Contraceptive implant A contraceptive implant is a thin, plastic tube that contains a hormone. It is inserted into the upper part of the arm. It can remain in place for up to 3 years. Progestin-only injections Progestin-only injections are injections of progestin, a synthetic form of the hormone progesterone. They are given every 3 months by a health care provider. Birth control pills Birth control pills are pills that contain hormones that prevent pregnancy. They must be taken once a day, preferably at the same time each day. Birth control patch The birth control patch contains hormones that prevent pregnancy. It is placed on the skin and must be changed once a week for three weeks and removed on the fourth week. A prescription is needed to use this method of contraception. Vaginal ring A vaginal ring contains hormones that prevent pregnancy. It is placed in the vagina for three weeks and removed on the fourth week. After that, the process is repeated with a new ring. A prescription is needed to use this method of contraception. Emergency contraceptive Emergency contraceptives prevent pregnancy after unprotected sex. They come in pill form and can be taken up to 5 days after sex. They work best the sooner they are taken after having sex. Most emergency contraceptives are available without a prescription. This method should not be used as your only form of birth control. Barrier methods Female condom A female condom is a thin sheath that is worn over the penis during sex. Condoms keep sperm from going inside a woman's body. They can be used with a spermicide to increase their effectiveness. They should be disposed after a single use. Female condom A female condom is a soft, loose-fitting sheath that is put into the vagina before sex. The condom keeps sperm from going  inside a woman's body. They should be disposed after a single use. Diaphragm A diaphragm is a soft, dome-shaped barrier. It is inserted into the vagina before sex, along with a spermicide. The diaphragm blocks sperm from entering the uterus, and the spermicide kills sperm. A diaphragm should be left in the vagina for 6-8 hours after sex and removed within 24 hours. A diaphragm is prescribed and fitted by a health care provider. A diaphragm should be replaced every 1-2 years, after giving birth, after gaining more than 15 lb (6.8 kg), and after pelvic surgery. Cervical cap A cervical cap is a round, soft latex or plastic cup that fits over the cervix. It is inserted into the vagina before sex, along with spermicide. It blocks sperm from entering the uterus. The cap should be left in place for 6-8 hours after sex and removed within 48 hours. A cervical cap must be prescribed and fitted by a health care provider. It should be replaced every 2 years. Sponge A sponge is a soft, circular piece of polyurethane foam with spermicide on it. The sponge helps block sperm from entering the uterus, and the spermicide kills sperm. To use it, you make it wet and then insert it into the vagina. It should be inserted before sex, left in for at least 6 hours after sex, and removed and thrown away within 30 hours. Spermicides Spermicides are chemicals that kill or block sperm from entering the cervix and uterus. They can come as a cream, jelly, suppository, foam, or tablet. A spermicide should be inserted into the vagina with an applicator at least 10-15 minutes before   sex to allow time for it to work. The process must be repeated every time you have sex. Spermicides do not require a prescription. Intrauterine contraception Intrauterine device (IUD) An IUD is a T-shaped device that is put in a woman's uterus. There are two types:  Hormone IUD.This type contains progestin, a synthetic form of the hormone progesterone. This  type can stay in place for 3-5 years.  Copper IUD.This type is wrapped in copper wire. It can stay in place for 10 years.  Permanent methods of contraception Female tubal ligation In this method, a woman's fallopian tubes are sealed, tied, or blocked during surgery to prevent eggs from traveling to the uterus. Hysteroscopic sterilization In this method, a small, flexible insert is placed into each fallopian tube. The inserts cause scar tissue to form in the fallopian tubes and block them, so sperm cannot reach an egg. The procedure takes about 3 months to be effective. Another form of birth control must be used during those 3 months. Female sterilization This is a procedure to tie off the tubes that carry sperm (vasectomy). After the procedure, the man can still ejaculate fluid (semen). Natural planning methods Natural family planning In this method, a couple does not have sex on days when the woman could become pregnant. Calendar method This means keeping track of the length of each menstrual cycle, identifying the days when pregnancy can happen, and not having sex on those days. Ovulation method In this method, a couple avoids sex during ovulation. Symptothermal method This method involves not having sex during ovulation. The woman typically checks for ovulation by watching changes in her temperature and in the consistency of cervical mucus. Post-ovulation method In this method, a couple waits to have sex until after ovulation. Summary  Contraception, also called birth control, means methods or devices that prevent pregnancy.  Hormonal methods of contraception include implants, injections, pills, patches, vaginal rings, and emergency contraceptives.  Barrier methods of contraception can include female condoms, female condoms, diaphragms, cervical caps, sponges, and spermicides.  There are two types of IUDs (intrauterine devices). An IUD can be put in a woman's uterus to prevent pregnancy  for 3-5 years.  Permanent sterilization can be done through a procedure for males, females, or both.  Natural family planning methods involve not having sex on days when the woman could become pregnant. This information is not intended to replace advice given to you by your health care provider. Make sure you discuss any questions you have with your health care provider. Document Released: 03/07/2005 Document Revised: 04/09/2016 Document Reviewed: 04/09/2016 Elsevier Interactive Patient Education  2018 Elsevier Inc.  

## 2017-06-20 LAB — CBC
Hematocrit: 32.7 % — ABNORMAL LOW (ref 34.0–46.6)
Hemoglobin: 10.8 g/dL — ABNORMAL LOW (ref 11.1–15.9)
MCH: 26.3 pg — AB (ref 26.6–33.0)
MCHC: 33 g/dL (ref 31.5–35.7)
MCV: 80 fL (ref 79–97)
PLATELETS: 223 10*3/uL (ref 150–379)
RBC: 4.11 x10E6/uL (ref 3.77–5.28)
RDW: 15.6 % — ABNORMAL HIGH (ref 12.3–15.4)
WBC: 10.5 10*3/uL (ref 3.4–10.8)

## 2017-06-20 LAB — HIV ANTIBODY (ROUTINE TESTING W REFLEX): HIV SCREEN 4TH GENERATION: NONREACTIVE

## 2017-06-20 LAB — RUBELLA SCREEN: Rubella Antibodies, IGG: 1.58 index (ref >0.99)

## 2017-06-20 LAB — GLUCOSE TOLERANCE, 2 HOURS W/ 1HR
GLUCOSE, 1 HOUR: 99 mg/dL (ref 65–179)
GLUCOSE, FASTING: 74 mg/dL (ref 65–91)
Glucose, 2 hour: 65 mg/dL (ref 65–152)

## 2017-06-20 LAB — HEPATITIS B SURFACE ANTIGEN: HEP B S AG: NEGATIVE

## 2017-06-20 LAB — RPR: RPR: NONREACTIVE

## 2017-07-03 ENCOUNTER — Encounter: Payer: Medicaid Other | Admitting: Advanced Practice Midwife

## 2017-07-03 ENCOUNTER — Ambulatory Visit (INDEPENDENT_AMBULATORY_CARE_PROVIDER_SITE_OTHER): Payer: Medicaid Other | Admitting: Advanced Practice Midwife

## 2017-07-03 ENCOUNTER — Encounter: Payer: Self-pay | Admitting: Advanced Practice Midwife

## 2017-07-03 VITALS — BP 108/70 | HR 96 | Wt 256.6 lb

## 2017-07-03 DIAGNOSIS — Z3480 Encounter for supervision of other normal pregnancy, unspecified trimester: Secondary | ICD-10-CM

## 2017-07-03 NOTE — Progress Notes (Signed)
Pt reports she is still having nausea and vomiting.  She has not obtained Rx for Ondansetron.

## 2017-07-03 NOTE — Progress Notes (Signed)
Patient ID: [name] M[place], female   DOB: Nov 06, 1995, 22 y.o.   MRN: [id number]   PRENATAL VISIT NOTE  Subjective:  [name] Oatley is a 22 y.o. [phone] at 255w2d being seen today for ongoing prenatal care.  She is currently monitored for the following issues for this low-risk pregnancy and has Sickle cell trait (HCC); Gallbladder stone without cholecystitis or obstruction; GBS bacteriuria; and Supervision of normal pregnancy on their problem list.  Patient reports no complaints.  Contractions: Not present. Vag. Bleeding: None.  Movement: Present. Denies leaking of fluid.   Patient feeling a lot of pressure, and requesting SVE today.   The following portions of the patient's history were reviewed and updated as appropriate: allergies, current medications, past family history, past medical history, past social history, past surgical history and problem list. Problem list updated.  Objective:   Vitals:   07/03/17 1852  BP: 108/70  Pulse: 96  Weight: 256 lb 9.6 oz (116.4 kg)    Fetal Status: Fetal Heart Rate (bpm): 152 Fundal Height: 33 cm Movement: Present     General:  Alert, oriented and cooperative. Patient is in no acute distress.  Skin: Skin is warm and dry. No rash noted.   Cardiovascular: Normal heart rate noted  Respiratory: Normal respiratory effort, no problems with respiration noted  Abdomen: Soft, gravid, appropriate for gestational age.  Pain/Pressure: Present     Pelvic: Cervical exam performed Dilation: Closed Effacement (%): 10 Station: Ballotable  Extremities: Normal range of motion.  Edema: None  Mental Status: Normal mood and affect. Normal behavior. Normal judgment and thought content.   Assessment and Plan:  Pregnancy: [id number] at 485w2d  1. Supervision of other normal pregnancy, antepartum - Routine care  Preterm labor symptoms and general obstetric precautions including but not limited to vaginal bleeding, contractions, leaking of fluid and fetal movement were  reviewed in detail with the patient. Please refer to After Visit Summary for other counseling recommendations.  Return in about 2 weeks (around 07/17/2017).  Future Appointments  Date Time Provider Department Center  07/17/2017 10:15 AM Armando ReichertHogan, Heather D, CNM [facility]  07/31/2017 10:15 AM Aundria Rudogers, Rudean CurtVeronica C, CNM [facility]  08/07/2017 10:15 AM Armando ReichertHogan, Heather D, CNM [facility]  08/15/2017 10:15 AM Rasch, Harolyn RutherfordJennifer I[facility]    Thressa ShellerHeather Hogan, CNM

## 2017-07-17 ENCOUNTER — Ambulatory Visit (INDEPENDENT_AMBULATORY_CARE_PROVIDER_SITE_OTHER): Payer: Medicaid Other | Admitting: Advanced Practice Midwife

## 2017-07-17 ENCOUNTER — Encounter: Payer: Self-pay | Admitting: Advanced Practice Midwife

## 2017-07-17 VITALS — BP 122/66 | HR 87 | Wt 256.9 lb

## 2017-07-17 DIAGNOSIS — Z3483 Encounter for supervision of other normal pregnancy, third trimester: Secondary | ICD-10-CM

## 2017-07-17 DIAGNOSIS — Z3480 Encounter for supervision of other normal pregnancy, unspecified trimester: Secondary | ICD-10-CM

## 2017-07-17 NOTE — Progress Notes (Signed)
   PRENATAL VISIT NOTE  Subjective:  [place] Fiorillo is a 22 y.o. [id number] at [redacted]w[redacted]d being seen today for ongoing prenatal care.  She is currently monitored for the following issues for this low-risk pregnancy and has Sickle cell trait (HCC); Gallbladder stone without cholecystitis or obstruction; GBS bacteriuria; and Supervision of normal pregnancy on their problem list.  Patient reports no complaints.  Contractions: Irritability. Vag. Bleeding: None.  Movement: Present. Denies leaking of fluid.   The following portions of the patient's history were reviewed and updated as appropriate: allergies, current medications, past family history, past medical history, past social history, past surgical history and problem list. Problem list updated.  Objective:   Vitals:   07/17/17 1054 07/17/17 1058  BP: (!) 85/41 122/66  Pulse: 88 87  Weight: 256 lb 14.4 oz (116.5 kg)     Fetal Status: Fetal Heart Rate (bpm): 155 Fundal Height: 34 cm Movement: Present     General:  Alert, oriented and cooperative. Patient is in no acute distress.  Skin: Skin is warm and dry. No rash noted.   Cardiovascular: Normal heart rate noted  Respiratory: Normal respiratory effort, no problems with respiration noted  Abdomen: Soft, gravid, appropriate for gestational age.  Pain/Pressure: Present     Pelvic: Cervical exam performed Dilation: Closed Effacement (%): Thick Station: Ballotable  Extremities: Normal range of motion.  Edema: Trace  Mental Status: Normal mood and affect. Normal behavior. Normal judgment and thought content.   Assessment and Plan:  Pregnancy: [id number] at [redacted]w[redacted]d  1. Supervision of other normal pregnancy, antepartum - Routine care   Preterm labor symptoms and general obstetric precautions including but not limited to vaginal bleeding, contractions, leaking of fluid and fetal movement were reviewed in detail with the patient. Please refer to After Visit Summary for other counseling  recommendations.  Return in about 2 weeks (around 07/31/2017).  Future Appointments  Date Time Provider Department Center  07/31/2017 10:15 AM Sharyon Cable, CNM [facility]  08/07/2017 10:15 AM Armando Reichert, CNM [facility]  08/15/2017 10:15 AM Rasch, Harolyn Rutherford, NP [facility]    Thressa Sheller, CNM

## 2017-07-17 NOTE — Progress Notes (Signed)
Bad Hemorrhoids

## 2017-07-20 LAB — URINE CULTURE, OB REFLEX

## 2017-07-20 LAB — CULTURE, OB URINE

## 2017-07-21 ENCOUNTER — Inpatient Hospital Stay (HOSPITAL_COMMUNITY)
Admission: AD | Admit: 2017-07-21 | Discharge: 2017-07-21 | Disposition: A | Discharge status: Home / Self Care | Payer: Medicaid Other | Source: Ambulatory Visit | Attending: Obstetrics & Gynecology | Admitting: Obstetrics & Gynecology

## 2017-07-21 ENCOUNTER — Encounter (HOSPITAL_COMMUNITY): Payer: Self-pay | Admitting: *Deleted

## 2017-07-21 DIAGNOSIS — O479 False labor, unspecified: Secondary | ICD-10-CM

## 2017-07-21 DIAGNOSIS — R109 Unspecified abdominal pain: Secondary | ICD-10-CM | POA: Diagnosis present

## 2017-07-21 DIAGNOSIS — O4703 False labor before 37 completed weeks of gestation, third trimester: Secondary | ICD-10-CM | POA: Diagnosis not present

## 2017-07-21 DIAGNOSIS — B9689 Other specified bacterial agents as the cause of diseases classified elsewhere: Secondary | ICD-10-CM

## 2017-07-21 DIAGNOSIS — Z3A34 34 weeks gestation of pregnancy: Secondary | ICD-10-CM | POA: Diagnosis not present

## 2017-07-21 DIAGNOSIS — N760 Acute vaginitis: Secondary | ICD-10-CM

## 2017-07-21 DIAGNOSIS — O26893 Other specified pregnancy related conditions, third trimester: Secondary | ICD-10-CM

## 2017-07-21 DIAGNOSIS — N898 Other specified noninflammatory disorders of vagina: Secondary | ICD-10-CM

## 2017-07-21 LAB — URINALYSIS, ROUTINE W REFLEX MICROSCOPIC
BILIRUBIN URINE: NEGATIVE
Glucose, UA: NEGATIVE mg/dL
HGB URINE DIPSTICK: NEGATIVE
KETONES UR: NEGATIVE mg/dL
NITRITE: NEGATIVE
Protein, ur: NEGATIVE mg/dL
Specific Gravity, Urine: 1.011 (ref 1.005–1.030)
pH: 7 (ref 5.0–8.0)

## 2017-07-21 LAB — WET PREP, GENITAL
SPERM: NONE SEEN
TRICH WET PREP: NONE SEEN
Yeast Wet Prep HPF POC: NONE SEEN

## 2017-07-21 MED ORDER — METRONIDAZOLE 500 MG PO TABS: 500.0000 mg | ORAL_TABLET | Freq: Two times a day (BID) | ORAL | 0 refills | Status: DC

## 2017-07-21 NOTE — Discharge Instructions (Signed)
Braxton Hicks Contractions °Contractions of the uterus can occur throughout pregnancy, but they are not always a sign that you are in labor. You may have practice contractions called Braxton Hicks contractions. These false labor contractions are sometimes confused with true labor. °What are Braxton Hicks contractions? °Braxton Hicks contractions are tightening movements that occur in the muscles of the uterus before labor. Unlike true labor contractions, these contractions do not result in opening (dilation) and thinning of the cervix. Toward the end of pregnancy (32-34 weeks), Braxton Hicks contractions can happen more often and may become stronger. These contractions are sometimes difficult to tell apart from true labor because they can be very uncomfortable. You should not feel embarrassed if you go to the hospital with false labor. °Sometimes, the only way to tell if you are in true labor is for your health care provider to look for changes in the cervix. The health care provider will do a physical exam and may monitor your contractions. If you are not in true labor, the exam should show that your cervix is not dilating and your water has not broken. °If there are other health problems associated with your pregnancy, it is completely safe for you to be sent home with false labor. You may continue to have Braxton Hicks contractions until you go into true labor. °How to tell the difference between true labor and false labor °True labor °· Contractions last 30-70 seconds. °· Contractions become very regular. °· Discomfort is usually felt in the top of the uterus, and it spreads to the lower abdomen and low back. °· Contractions do not go away with walking. °· Contractions usually become more intense and increase in frequency. °· The cervix dilates and gets thinner. °False labor °· Contractions are usually shorter and not as strong as true labor contractions. °· Contractions are usually irregular. °· Contractions  are often felt in the front of the lower abdomen and in the groin. °· Contractions may go away when you walk around or change positions while lying down. °· Contractions get weaker and are shorter-lasting as time goes on. °· The cervix usually does not dilate or become thin. °Follow these instructions at home: °· Take over-the-counter and prescription medicines only as told by your health care provider. °· Keep up with your usual exercises and follow other instructions from your health care provider. °· Eat and drink lightly if you think you are going into labor. °· If Braxton Hicks contractions are making you uncomfortable: °? Change your position from lying down or resting to walking, or change from walking to resting. °? Sit and rest in a tub of warm water. °? Drink enough fluid to keep your urine pale yellow. Dehydration may cause these contractions. °? Do slow and deep breathing several times an hour. °· Keep all follow-up prenatal visits as told by your health care provider. This is important. °Contact a health care provider if: °· You have a fever. °· You have continuous pain in your abdomen. °Get help right away if: °· Your contractions become stronger, more regular, and closer together. °· You have fluid leaking or gushing from your vagina. °· You pass blood-tinged mucus (bloody show). °· You have bleeding from your vagina. °· You have low back pain that you never had before. °· You feel your baby’s head pushing down and causing pelvic pressure. °· Your baby is not moving inside you as much as it used to. °Summary °· Contractions that occur before labor are called Braxton   Hicks contractions, false labor, or practice contractions. °· Braxton Hicks contractions are usually shorter, weaker, farther apart, and less regular than true labor contractions. True labor contractions usually become progressively stronger and regular and they become more frequent. °· Manage discomfort from Braxton Hicks contractions by  changing position, resting in a warm bath, drinking plenty of water, or practicing deep breathing. °This information is not intended to replace advice given to you by your health care provider. Make sure you discuss any questions you have with your health care provider. °Document Released: 07/21/2016 Document Revised: 07/21/2016 Document Reviewed: 07/21/2016 °Elsevier Interactive Patient Education © 2018 Elsevier Inc. ° °

## 2017-07-21 NOTE — MAU Note (Signed)
Pt c/o low abd cramping once every 2-3 hours, and a stinging sharp pain in the vagina when the baby moves.  Pt also reports a very thin, white non odorous discharge that started today. States the baby is moving well.

## 2017-07-21 NOTE — MAU Provider Note (Signed)
History     CSN: [id number]  Arrival date and time: 07/21/17 2044   First Provider Initiated Contact with Patient 07/21/17 2148     Chief Complaint  Patient presents with  . Abdominal Pain   HPI Beverly Anthony is a 22 y.o. [id number] at [redacted]w[redacted]d who presents with contractions. She states she has a contraction every 2-3 hours. She rates the pain a 6/10 when she has them. She denies any leaking or bleeding. Reports good fetal movement. She reports an increase in creamy white discharge.   OB History    Gravida  4   Para  2   Term  2   Preterm  0   AB  1   Living  2     SAB  1   TAB  0   Ectopic  0   Multiple  0   Live Births  2        Obstetric Comments  03/12/14 SAB 12 wks        Past Medical History:  Diagnosis Date  . Obesity   . UTI (urinary tract infection)     Past Surgical History:  Procedure Laterality Date  . EXTERNAL EAR SURGERY Bilateral 2017   keloid removal  . keloid     removal on ear    Family History  Problem Relation Age of Onset  . Cancer Paternal Grandfather   . Diabetes Father   . Hypertension Father     Social History   Tobacco Use  . Smoking status: Never Smoker  . Smokeless tobacco: Never Used  Substance Use Topics  . Alcohol use: No  . Drug use: No    Allergies: No Known Allergies  Medications Prior to Admission  Medication Sig Dispense Refill Last Dose  . acetaminophen (TYLENOL) 500 MG tablet Take 500 mg by mouth every 6 (six) hours as needed for mild pain.   Past Month at Unknown time  . Prenatal MV-Min-FA-Omega-3 (PRENATAL GUMMIES/DHA & FA PO) Take by mouth.   07/21/2017 at Unknown time    Review of Systems  Constitutional: Negative.  Negative for fatigue and fever.  HENT: Negative.   Respiratory: Negative.  Negative for shortness of breath.   Cardiovascular: Negative.  Negative for chest pain.  Gastrointestinal: Positive for abdominal pain. Negative for constipation, diarrhea, nausea and vomiting.   Genitourinary: Positive for vaginal discharge. Negative for dysuria and vaginal bleeding.  Neurological: Negative.  Negative for dizziness and headaches.   Physical Exam   Blood pressure (!) 112/52, pulse 86, temperature 98.1 F (36.7 C), temperature source Oral, resp. rate 16, height  (1.575 m), weight 258 lb (117 kg), last menstrual period 11/19/2016, unknown if currently breastfeeding.  Physical Exam  Nursing note and vitals reviewed. Constitutional: She is oriented to person, place, and time. She appears well-developed and well-nourished. No distress.  HENT:  Head: Normocephalic.  Eyes: Pupils are equal, round, and reactive to light.  Cardiovascular: Normal rate, regular rhythm and normal heart sounds.  Respiratory: Effort normal and breath sounds normal. No respiratory distress.  GI: Soft. Bowel sounds are normal. She exhibits no distension. There is no tenderness.  Neurological: She is alert and oriented to person, place, and time.  Skin: Skin is warm and dry.  Psychiatric: She has a normal mood and affect. Her behavior is normal. Judgment and thought content normal.   Dilation: Closed Effacement (%): Thick Cervical Position: Posterior Exam by:: Ma Hillock CNM  Fetal Tracing:  Baseline: 130 Variability: moderate Accels:  15x15 Decels: none  Toco: none   MAU Course  Procedures Results for orders placed or performed during the hospital encounter of 07/21/17 (from the past 24 hour(s))  Urinalysis, Routine w reflex microscopic     Status: Abnormal   Collection Time: 07/21/17  8:49 PM  Result Value Ref Range   Color, Urine YELLOW YELLOW   APPearance CLEAR CLEAR   Specific Gravity, Urine 1.011 1.005 - 1.030   pH 7.0 5.0 - 8.0   Glucose, UA NEGATIVE NEGATIVE mg/dL   Hgb urine dipstick NEGATIVE NEGATIVE   Bilirubin Urine NEGATIVE NEGATIVE   Ketones, ur NEGATIVE NEGATIVE mg/dL   Protein, ur NEGATIVE NEGATIVE mg/dL   Nitrite NEGATIVE NEGATIVE   Leukocytes, UA  MODERATE (A) NEGATIVE   RBC / HPF 0-5 0 - 5 RBC/hpf   WBC, UA 0-5 0 - 5 WBC/hpf   Bacteria, UA RARE (A) NONE SEEN   Squamous Epithelial / LPF 0-5 0 - 5   Mucus PRESENT   Wet prep, genital     Status: Abnormal   Collection Time: 07/21/17  9:55 PM  Result Value Ref Range   Yeast Wet Prep HPF POC NONE SEEN NONE SEEN   Trich, Wet Prep NONE SEEN NONE SEEN   Clue Cells Wet Prep HPF POC PRESENT (A) NONE SEEN   WBC, Wet Prep HPF POC FEW (A) NONE SEEN   Sperm NONE SEEN    MDM UA Wet prep and gc/chlamydia No signs or symptoms of preterm labor at this time  Assessment and Plan   1. Braxton Hick's contraction   2. [redacted] weeks gestation of pregnancy   3. Vaginal discharge during pregnancy in third trimester    -Discharge home in stable condition -Preterm labor precautions discussed -Patient advised to follow-up with [facility] as scheduled for prenatal caer -Patient may return to MAU as needed or if her condition were to change or worsen  Rolm Bookbinder CNM 07/21/2017, 9:48 PM

## 2017-07-24 LAB — GC/CHLAMYDIA PROBE AMP (~~LOC~~) NOT AT ARMC
CHLAMYDIA, DNA PROBE: NEGATIVE
Neisseria Gonorrhea: NEGATIVE

## 2017-07-31 ENCOUNTER — Encounter (HOSPITAL_COMMUNITY): Payer: Self-pay | Admitting: *Deleted

## 2017-07-31 ENCOUNTER — Inpatient Hospital Stay (HOSPITAL_COMMUNITY)
Admission: AD | Admit: 2017-07-31 | Discharge: 2017-07-31 | Disposition: A | Discharge status: Home / Self Care | Payer: Medicaid Other | Source: Ambulatory Visit | Attending: Obstetrics and Gynecology | Admitting: Obstetrics and Gynecology

## 2017-07-31 ENCOUNTER — Encounter: Payer: Medicaid Other | Admitting: Certified Nurse Midwife

## 2017-07-31 DIAGNOSIS — N898 Other specified noninflammatory disorders of vagina: Secondary | ICD-10-CM | POA: Diagnosis present

## 2017-07-31 DIAGNOSIS — Z87440 Personal history of urinary (tract) infections: Secondary | ICD-10-CM | POA: Diagnosis not present

## 2017-07-31 DIAGNOSIS — R8271 Bacteriuria: Secondary | ICD-10-CM

## 2017-07-31 DIAGNOSIS — E669 Obesity, unspecified: Secondary | ICD-10-CM | POA: Diagnosis not present

## 2017-07-31 DIAGNOSIS — Z833 Family history of diabetes mellitus: Secondary | ICD-10-CM | POA: Diagnosis not present

## 2017-07-31 DIAGNOSIS — Z3A36 36 weeks gestation of pregnancy: Secondary | ICD-10-CM | POA: Diagnosis not present

## 2017-07-31 DIAGNOSIS — Z8249 Family history of ischemic heart disease and other diseases of the circulatory system: Secondary | ICD-10-CM | POA: Insufficient documentation

## 2017-07-31 DIAGNOSIS — O4703 False labor before 37 completed weeks of gestation, third trimester: Secondary | ICD-10-CM | POA: Diagnosis not present

## 2017-07-31 DIAGNOSIS — O99213 Obesity complicating pregnancy, third trimester: Secondary | ICD-10-CM | POA: Diagnosis not present

## 2017-07-31 LAB — POCT FERN TEST: POCT FERN TEST: NEGATIVE

## 2017-07-31 NOTE — MAU Note (Signed)
I have communicated with Nolene Bernheim, NP and reviewed vital signs:  Vitals:   07/31/17 1050  BP: 118/73  Pulse: 87  Resp: 17  Temp: 98.3 F (36.8 C)  SpO2: 97%    Vaginal exam:  Dilation: Closed Effacement (%): Thick Station: Ballotable Exam by:: Burleson, NP,   Also reviewed contraction pattern and that non-stress test is reactive.  It has been documented that patient is not contracting today. Pt was seen today for PROM, Fern Test was negative after speculum exam. Cervix was closed and thick.  Patient denies any other complaints.  Based on this report provider has given order for discharge.  A discharge order and diagnosis entered by a provider.   Labor discharge instructions reviewed with patient.

## 2017-07-31 NOTE — Discharge Instructions (Signed)
Keep your appointment in the clinic next Monday at 10:15. Return here if you have vaginal bleeding, leaking of fluid, baby is not moving or labor contractions.

## 2017-07-31 NOTE — MAU Provider Note (Signed)
History     CSN: [id number]  Arrival date and time: 07/31/17 1034  Seen by provider at 1135    Chief Complaint  Patient presents with  . Rupture of Membranes   HPI Beverly Anthony 22 y.o. [redacted]w[redacted]d Client felt wet on awakening this morning and thought her water broke.  Came to MAU.  Missed her ROB appointment in the clinic today.  Did not wear a pad and has not had any further leaking of fluid.  No contractions.  No vaginal bleeding.  No contractions.  Baby is moving well.  OB History    Gravida  4   Para  2   Term  2   Preterm  0   AB  1   Living  2     SAB  1   TAB  0   Ectopic  0   Multiple  0   Live Births  2        Obstetric Comments  03/12/14 SAB 12 wks        Past Medical History:  Diagnosis Date  . Obesity   . UTI (urinary tract infection)     Past Surgical History:  Procedure Laterality Date  . EXTERNAL EAR SURGERY Bilateral 2017   keloid removal  . keloid     removal on ear    Family History  Problem Relation Age of Onset  . Cancer Paternal Grandfather   . Diabetes Father   . Hypertension Father     Social History   Tobacco Use  . Smoking status: Never Smoker  . Smokeless tobacco: Never Used  Substance Use Topics  . Alcohol use: No  . Drug use: No    Allergies: No Known Allergies  Medications Prior to Admission  Medication Sig Dispense Refill Last Dose  . acetaminophen (TYLENOL) 500 MG tablet Take 500 mg by mouth every 6 (six) hours as needed for mild pain.   Past Month at Unknown time  . metroNIDAZOLE (FLAGYL) 500 MG tablet Take 1 tablet (500 mg total) by mouth 2 (two) times daily. 14 tablet 0   . Prenatal MV-Min-FA-Omega-3 (PRENATAL GUMMIES/DHA & FA PO) Take by mouth.   07/21/2017 at Unknown time    Review of Systems  Constitutional: Negative for fever.  Gastrointestinal: Negative for abdominal pain, diarrhea and vomiting.  Genitourinary: Negative for vaginal bleeding and vaginal discharge.       Leaking of fluid x 1    Physical Exam   Blood pressure 118/73, pulse 87, temperature 98.3 F (36.8 C), temperature source Oral, resp. rate 17, height  (1.575 m), weight 259 lb (117.5 kg), last menstrual period 11/19/2016, SpO2 97 %, unknown if currently breastfeeding.  Physical Exam  Nursing note and vitals reviewed. Constitutional: She is oriented to person, place, and time. She appears well-developed and well-nourished.  HENT:  Head: Normocephalic.  Eyes: EOM are normal.  Neck: Neck supple.  GI: Soft. There is no tenderness. There is no rebound and no guarding.  FHT baseline 130 with moderate variability.  15x15 accels seen, no contractions, no decelerations.  Reactive NST  Genitourinary:  Genitourinary Comments: Speculum exam for possible ROM No pooling, no leaking with valsalva, Fern slide done - negative Cervix - closed, thick, vertex is ballotable.  Musculoskeletal: Normal range of motion.  Neurological: She is alert and oriented to person, place, and time.  Skin: Skin is warm and dry.  Psychiatric: She has a normal mood and affect.    MAU Course  Procedures  Results for orders placed or performed during the hospital encounter of 07/31/17 (from the past 24 hour(s))  POCT fern test     Status: None   Collection Time: 07/31/17 11:42 AM  Result Value Ref Range   POCT Fern Test Negative = intact amniotic membranes     MDM Missed clinic appointment today.  Thought her water broke and came to MAU.  No evidence of ROM on exam.  Will discharge home and be seen in the clinic at her scheduled appointment next week.  Assessment and Plan  Threatened labor  Plan Drink at least 8 8-oz glasses of water every day. Keep your appointment in the clinic next week. Return to MAU if you have further leaking of fluid, vaginal bleeding, baby is not moving well or severe lower abdominal pain.  Or come if in labor.   Beverly Anthony L Beverly Anthony 07/31/2017, 11:32 AM

## 2017-07-31 NOTE — MAU Note (Signed)
Pt reports at 9 am today she went to the restroom and noticed her underwear were wet, having off/on abd pain. Called the clinic and was told to come here

## 2017-08-07 ENCOUNTER — Other Ambulatory Visit (HOSPITAL_COMMUNITY)
Admission: RE | Admit: 2017-08-07 | Discharge: 2017-08-07 | Disposition: A | Discharge status: Home / Self Care | Payer: Medicaid Other | Source: Ambulatory Visit | Attending: Advanced Practice Midwife | Admitting: Advanced Practice Midwife

## 2017-08-07 ENCOUNTER — Other Ambulatory Visit: Payer: Self-pay

## 2017-08-07 ENCOUNTER — Ambulatory Visit (INDEPENDENT_AMBULATORY_CARE_PROVIDER_SITE_OTHER): Payer: Medicaid Other | Admitting: Advanced Practice Midwife

## 2017-08-07 ENCOUNTER — Encounter: Payer: Self-pay | Admitting: Advanced Practice Midwife

## 2017-08-07 VITALS — BP 112/72 | HR 82 | Wt 257.0 lb

## 2017-08-07 DIAGNOSIS — Z3A37 37 weeks gestation of pregnancy: Secondary | ICD-10-CM | POA: Insufficient documentation

## 2017-08-07 DIAGNOSIS — O4703 False labor before 37 completed weeks of gestation, third trimester: Secondary | ICD-10-CM

## 2017-08-07 DIAGNOSIS — Z3483 Encounter for supervision of other normal pregnancy, third trimester: Secondary | ICD-10-CM

## 2017-08-07 DIAGNOSIS — O479 False labor, unspecified: Secondary | ICD-10-CM | POA: Diagnosis not present

## 2017-08-07 NOTE — Progress Notes (Signed)
   PRENATAL VISIT NOTE  Subjective:  Beverly Anthony is a 22 y.o. [phone] at [redacted]w[redacted]d being seen today for ongoing prenatal care.  She is currently monitored for the following issues for this low-risk pregnancy and has Sickle cell trait (HCC); Gallbladder stone without cholecystitis or obstruction; GBS bacteriuria; and Supervision of normal pregnancy on their problem list.  Patient reports fatigue, no bleeding, no contractions, no cramping, no leaking and occasional contractions.  Contractions: Irregular. Vag. Bleeding: None.  Movement: Present. Denies leaking of fluid.   The following portions of the patient's history were reviewed and updated as appropriate: allergies, current medications, past family history, past medical history, past social history, past surgical history and problem list. Problem list updated.  Objective:   Vitals:   08/07/17 1112  BP: 112/72  Pulse: 82  Weight: 257 lb (116.6 kg)    Fetal Status: Fetal Heart Rate (bpm): 128 Fundal Height: 37 cm Movement: Present     General:  Alert, oriented and cooperative. Patient is in no acute distress.  Skin: Skin is warm and dry. No rash noted.   Cardiovascular: Normal heart rate noted  Respiratory: Normal respiratory effort, no problems with respiration noted  Abdomen: Soft, gravid, appropriate for gestational age.  Pain/Pressure: Present     Pelvic: Cervical exam performed Dilation: Closed Effacement (%): Thick Station: Ballotable0/thick/posterior  Extremities: Normal range of motion.  Edema: Trace  Mental Status: Normal mood and affect. Normal behavior. Normal judgment and thought content.   Assessment and Plan:  Pregnancy: [id number] at [redacted]w[redacted]d GC/Chlamydia this visit Routine care Braxton Hicks contractions 0/thick/posterior, SVE per pt request Return to clinic in one week, see below  There are no diagnoses linked to this encounter. Term labor symptoms and general obstetric precautions including but not limited to vaginal  bleeding, contractions, leaking of fluid and fetal movement were reviewed in detail with the patient. Reviewed difference between [facility] contractions and authentic labor. Counseled on timing contractions and ensuring PO hydration with water.  Please refer to After Visit Summary for other counseling recommendations.  Return in about 1 week (around 08/14/2017).  Future Appointments  Date Time Provider Department Center  08/15/2017 10:15 AM Rasch, Harolyn Rutherford, NP [facility] WOC    [place], [place] 08/07/17  11:35 AM

## 2017-08-08 LAB — CERVICOVAGINAL ANCILLARY ONLY
CHLAMYDIA, DNA PROBE: NEGATIVE
Neisseria Gonorrhea: NEGATIVE

## 2017-08-15 ENCOUNTER — Ambulatory Visit (INDEPENDENT_AMBULATORY_CARE_PROVIDER_SITE_OTHER): Payer: Medicaid Other | Admitting: Obstetrics and Gynecology

## 2017-08-15 VITALS — BP 109/61 | HR 95 | Wt 257.2 lb

## 2017-08-15 DIAGNOSIS — R8271 Bacteriuria: Secondary | ICD-10-CM

## 2017-08-15 DIAGNOSIS — Z3009 Encounter for other general counseling and advice on contraception: Secondary | ICD-10-CM

## 2017-08-15 DIAGNOSIS — Z3483 Encounter for supervision of other normal pregnancy, third trimester: Secondary | ICD-10-CM

## 2017-08-15 DIAGNOSIS — D573 Sickle-cell trait: Secondary | ICD-10-CM

## 2017-08-15 HISTORY — DX: Encounter for other general counseling and advice on contraception: Z30.09

## 2017-08-15 NOTE — Progress Notes (Signed)
Pt states contractions have been more frequent, would like to be checked.

## 2017-08-15 NOTE — Progress Notes (Signed)
   PRENATAL VISIT NOTE  Subjective:  Beverly Anthony Beverly Anthony is a 22 y.o. [id number] at [redacted]w[redacted]d being seen today for ongoing prenatal care.  She is currently monitored for the following issues for this low-risk pregnancy and has Sickle cell trait (HCC); Gallbladder stone without cholecystitis or obstruction; GBS bacteriuria; Supervision of normal pregnancy; and Unwanted fertility on their problem list.  Patient reports bleeding and contractions since yesterday. .  Contractions: Irregular. Vag. Bleeding: None.  Movement: Present. Denies leaking of fluid.   The following portions of the patient's history were reviewed and updated as appropriate: allergies, current medications, past family history, past medical history, past social history, past surgical history and problem list. Problem list updated.  Objective:   Vitals:   08/15/17 1032  BP: 109/61  Pulse: 95  Weight: 257 lb 3.2 oz (116.7 kg)    Fetal Status: Fetal Heart Rate (bpm): 134 Fundal Height: 37 cm Movement: Present     General:  Alert, oriented and cooperative. Patient is in no acute distress.  Skin: Skin is warm and dry. No rash noted.   Cardiovascular: Normal heart rate noted  Respiratory: Normal respiratory effort, no problems with respiration noted  Abdomen: Soft, gravid, appropriate for gestational age.  Pain/Pressure: Present     Pelvic: Cervical exam performed        Extremities: Normal range of motion.  Edema: Trace  Mental Status: Normal mood and affect. Normal behavior. Normal judgment and thought content.   Assessment and Plan:  Pregnancy: [id number] at [redacted]w[redacted]d  1. Encounter for supervision of other normal pregnancy in third trimester  - GBS positive   2. Unwanted fertility  BTL desired, consent signed on 06/19/17  3. GBS bacteriuria   4. Sickle cell trait (HCC)  Unknown partner status, partner has not been tested.   Term labor symptoms and general obstetric precautions including but not limited to vaginal bleeding,  contractions, leaking of fluid and fetal movement were reviewed in detail with the patient. Please refer to After Visit Summary for other counseling recommendations.  No follow-ups on file.  No future appointments.  Venia Carbon, NP

## 2017-08-18 ENCOUNTER — Other Ambulatory Visit: Payer: Self-pay

## 2017-08-18 ENCOUNTER — Inpatient Hospital Stay (HOSPITAL_COMMUNITY)
Admission: AD | Admit: 2017-08-18 | Discharge: 2017-08-18 | Disposition: A | Discharge status: Home / Self Care | Payer: Medicaid Other | Source: Ambulatory Visit | Attending: Obstetrics and Gynecology | Admitting: Obstetrics and Gynecology

## 2017-08-18 DIAGNOSIS — R8271 Bacteriuria: Secondary | ICD-10-CM

## 2017-08-18 DIAGNOSIS — O471 False labor at or after 37 completed weeks of gestation: Secondary | ICD-10-CM | POA: Diagnosis not present

## 2017-08-18 DIAGNOSIS — O479 False labor, unspecified: Secondary | ICD-10-CM

## 2017-08-18 DIAGNOSIS — Z3483 Encounter for supervision of other normal pregnancy, third trimester: Secondary | ICD-10-CM

## 2017-08-18 MED ORDER — ONDANSETRON 8 MG PO TBDP: 8.0000 mg | ORAL_TABLET | Freq: Three times a day (TID) | ORAL | 0 refills | Status: DC | PRN

## 2017-08-18 MED ORDER — ONDANSETRON 8 MG PO TBDP
8.0000 mg | ORAL_TABLET | Freq: Once | ORAL | Status: AC
  Administered 2017-08-18: 8 mg via ORAL
  Filled 2017-08-18: qty 1

## 2017-08-18 NOTE — MAU Note (Signed)
I have communicated with [place]. Neil, CNM and reviewed vital signs:  Vitals:   08/18/17 1848 08/18/17 2138  BP: 123/69 (!) 114/53  Pulse: 93 86  Resp: 18 18  Temp: 98.5 F (36.9 C)   SpO2: 100%     Vaginal exam:  Dilation: Fingertip Effacement (%): Thick Cervical Position: Posterior Exam by:: K.Syona Wroblewski,RN,   Also reviewed contraction pattern and that non-stress test is reactive.  It has been documented that patient is contracting infrequently with some mild UI. with no cervical change since last cervical check  A few days ago not indicating active labor.  Patient denies any other complaints.  Based on this report provider has given order for discharge.  A discharge order and diagnosis entered by a provider.   Labor discharge instructions reviewed with patient.

## 2017-08-18 NOTE — MAU Note (Signed)
Cramping in stomach and sharp pain in low back, started about 2-3 hrs ago.  Can't hold anything down since yesterday.  Denies bleeding or leaking.  Was 1 cm when last checked.

## 2017-08-21 ENCOUNTER — Encounter: Payer: Self-pay | Admitting: Advanced Practice Midwife

## 2017-08-21 ENCOUNTER — Ambulatory Visit (INDEPENDENT_AMBULATORY_CARE_PROVIDER_SITE_OTHER): Payer: Medicaid Other

## 2017-08-21 ENCOUNTER — Other Ambulatory Visit: Payer: Self-pay

## 2017-08-21 VITALS — BP 113/64 | HR 92 | Wt 261.0 lb

## 2017-08-21 DIAGNOSIS — Z3480 Encounter for supervision of other normal pregnancy, unspecified trimester: Secondary | ICD-10-CM

## 2017-08-21 DIAGNOSIS — Z3483 Encounter for supervision of other normal pregnancy, third trimester: Secondary | ICD-10-CM

## 2017-08-21 DIAGNOSIS — R8271 Bacteriuria: Secondary | ICD-10-CM

## 2017-08-21 NOTE — Progress Notes (Signed)
   PRENATAL VISIT NOTE  Subjective:  Beverly Anthony is a 22 y.o. [id number] at 6334w2d being seen today for ongoing prenatal care.  She is currently monitored for the following issues for this low-risk pregnancy and has Sickle cell trait (HCC); Gallbladder stone without cholecystitis or obstruction; GBS bacteriuria; Supervision of normal pregnancy; and Unwanted fertility on their problem list.  Patient reports no complaints.  Contractions: Irregular. Vag. Bleeding: None.  Movement: Present. Denies leaking of fluid.   The following portions of the patient's history were reviewed and updated as appropriate: allergies, current medications, past family history, past medical history, past social history, past surgical history and problem list. Problem list updated.  Objective:   Vitals:   08/21/17 1609  BP: 113/64  Pulse: 92  Weight: 261 lb (118.4 kg)    Fetal Status: Fetal Heart Rate (bpm): 125 Fundal Height: 38 cm Movement: Present     General:  Alert, oriented and cooperative. Patient is in no acute distress.  Skin: Skin is warm and dry. No rash noted.   Cardiovascular: Normal heart rate noted  Respiratory: Normal respiratory effort, no problems with respiration noted  Abdomen: Soft, gravid, appropriate for gestational age.  Pain/Pressure: Present     Pelvic: Cervical exam performed Dilation: Closed Effacement (%): Thick Station: Ballotable  Extremities: Normal range of motion.  Edema: Trace  Mental Status: Normal mood and affect. Normal behavior. Normal judgment and thought content.   Assessment and Plan:  Pregnancy: [id number] at 4934w2d  1. Supervision of other normal pregnancy, antepartum - No complaints. Routine care  2. GBS bacteriuria -Prophylaxis in labor  Term labor symptoms and general obstetric precautions including but not limited to vaginal bleeding, contractions, leaking of fluid and fetal movement were reviewed in detail with the patient. Please refer to After Visit Summary  for other counseling recommendations.  Return in about 1 week (around 08/28/2017) for Return OB visit.  Rolm BookbinderCaroline M Neill, CNM 08/21/17 4:22 PM

## 2017-08-21 NOTE — Patient Instructions (Signed)

## 2017-08-22 ENCOUNTER — Encounter: Payer: Medicaid Other | Admitting: Advanced Practice Midwife

## 2017-08-28 ENCOUNTER — Ambulatory Visit (INDEPENDENT_AMBULATORY_CARE_PROVIDER_SITE_OTHER): Payer: Medicaid Other | Admitting: Obstetrics and Gynecology

## 2017-08-28 ENCOUNTER — Encounter: Payer: Self-pay | Admitting: Obstetrics and Gynecology

## 2017-08-28 VITALS — BP 131/62 | HR 100 | Wt 258.3 lb

## 2017-08-28 DIAGNOSIS — R8271 Bacteriuria: Secondary | ICD-10-CM

## 2017-08-28 DIAGNOSIS — Z3009 Encounter for other general counseling and advice on contraception: Secondary | ICD-10-CM

## 2017-08-28 DIAGNOSIS — Z3483 Encounter for supervision of other normal pregnancy, third trimester: Secondary | ICD-10-CM

## 2017-08-28 NOTE — Patient Instructions (Signed)

## 2017-08-28 NOTE — Progress Notes (Signed)
   PRENATAL VISIT NOTE  Subjective:  Beverly Anthony is a 22 y.o. G4W1027G4P2012 at 6719w2d being seen today for ongoing prenatal care.  She is currently monitored for the following issues for this low-risk pregnancy and has Sickle cell trait (HCC); Gallbladder stone without cholecystitis or obstruction; GBS bacteriuria; Supervision of normal pregnancy; and Unwanted fertility on their problem list.  Patient reports irregular contractions, were stronger and more regular last night and then improved.  Contractions: Irregular. Vag. Bleeding: None.  Movement: Present. Denies leaking of fluid.   The following portions of the patient's history were reviewed and updated as appropriate: allergies, current medications, past family history, past medical history, past social history, past surgical history and problem list. Problem list updated.  Objective:   Vitals:   08/28/17 1533  BP: 131/62  Pulse: 100  Weight: 258 lb 4.8 oz (117.2 kg)    Fetal Status: Fetal Heart Rate (bpm): 140   Movement: Present     General:  Alert, oriented and cooperative. Patient is in no acute distress.  Skin: Skin is warm and dry. No rash noted.   Cardiovascular: Normal heart rate noted  Respiratory: Normal respiratory effort, no problems with respiration noted  Abdomen: Soft, gravid, appropriate for gestational age.  Pain/Pressure: Present     Pelvic: Cervical exam performed      1/10/-1  Extremities: Normal range of motion.  Edema: Trace  Mental Status: Normal mood and affect. Normal behavior. Normal judgment and thought content.   Assessment and Plan:  Pregnancy: [id number] at 5319w2d  1. GBS bacteriuria ppx in labor  2. Encounter for supervision of other normal pregnancy in third trimester IOL scheduled for 41 weeks  3. Unwanted fertility For BTL  Term labor symptoms and general obstetric precautions including but not limited to vaginal bleeding, contractions, leaking of fluid and fetal movement were reviewed in  detail with the patient. Please refer to After Visit Summary for other counseling recommendations.  Return in about 5 weeks (around 10/02/2017) for post partum check.  Future Appointments  Date Time Provider Department Center  08/29/2017  1:15 PM [facility]ST [facility]    Conan BowensKelly M Davis, MD

## 2017-08-29 ENCOUNTER — Encounter: Payer: Medicaid Other | Admitting: Obstetrics and Gynecology

## 2017-08-29 ENCOUNTER — Telehealth (HOSPITAL_COMMUNITY): Payer: Self-pay | Admitting: *Deleted

## 2017-08-29 ENCOUNTER — Encounter (HOSPITAL_COMMUNITY): Payer: Self-pay | Admitting: *Deleted

## 2017-08-29 ENCOUNTER — Ambulatory Visit (INDEPENDENT_AMBULATORY_CARE_PROVIDER_SITE_OTHER): Payer: Medicaid Other | Admitting: *Deleted

## 2017-08-29 ENCOUNTER — Ambulatory Visit: Payer: Self-pay

## 2017-08-29 VITALS — BP 122/75 | HR 104 | Wt 258.5 lb

## 2017-08-29 DIAGNOSIS — O480 Post-term pregnancy: Secondary | ICD-10-CM

## 2017-08-29 NOTE — Progress Notes (Signed)

## 2017-08-29 NOTE — Telephone Encounter (Signed)
Preadmission screen  

## 2017-09-01 NOTE — Telephone Encounter (Signed)
Preadmission screen  

## 2017-09-02 ENCOUNTER — Inpatient Hospital Stay (HOSPITAL_COMMUNITY)
Admission: RE | Admit: 2017-09-02 | Discharge: 2017-09-04 | DRG: 807 | Disposition: A | Discharge status: Home / Self Care | Payer: Medicaid Other | Source: Ambulatory Visit | Attending: Obstetrics and Gynecology | Admitting: Obstetrics and Gynecology

## 2017-09-02 ENCOUNTER — Encounter (HOSPITAL_COMMUNITY): Payer: Self-pay

## 2017-09-02 ENCOUNTER — Other Ambulatory Visit: Payer: Self-pay

## 2017-09-02 ENCOUNTER — Inpatient Hospital Stay (HOSPITAL_COMMUNITY): Payer: Medicaid Other | Admitting: Anesthesiology

## 2017-09-02 DIAGNOSIS — O9902 Anemia complicating childbirth: Secondary | ICD-10-CM | POA: Diagnosis present

## 2017-09-02 DIAGNOSIS — D573 Sickle-cell trait: Secondary | ICD-10-CM | POA: Diagnosis present

## 2017-09-02 DIAGNOSIS — Z3A41 41 weeks gestation of pregnancy: Secondary | ICD-10-CM

## 2017-09-02 DIAGNOSIS — O99824 Streptococcus B carrier state complicating childbirth: Secondary | ICD-10-CM | POA: Diagnosis present

## 2017-09-02 DIAGNOSIS — O480 Post-term pregnancy: Secondary | ICD-10-CM

## 2017-09-02 DIAGNOSIS — Z3483 Encounter for supervision of other normal pregnancy, third trimester: Secondary | ICD-10-CM

## 2017-09-02 DIAGNOSIS — O99214 Obesity complicating childbirth: Secondary | ICD-10-CM | POA: Diagnosis present

## 2017-09-02 DIAGNOSIS — R8271 Bacteriuria: Secondary | ICD-10-CM

## 2017-09-02 HISTORY — DX: Post-term pregnancy: O48.0

## 2017-09-02 LAB — CBC
HCT: 35.1 % — ABNORMAL LOW (ref 36.0–46.0)
Hemoglobin: 12 g/dL (ref 12.0–15.0)
MCH: 27 pg (ref 26.0–34.0)
MCHC: 34.2 g/dL (ref 30.0–36.0)
MCV: 79.1 fL (ref 78.0–100.0)
PLATELETS: 216 10*3/uL (ref 150–400)
RBC: 4.44 MIL/uL (ref 3.87–5.11)
RDW: 15.3 % (ref 11.5–15.5)
WBC: 12.1 10*3/uL — AB (ref 4.0–10.5)

## 2017-09-02 LAB — TYPE AND SCREEN
ABO/RH(D): B POS
ANTIBODY SCREEN: NEGATIVE

## 2017-09-02 MED ORDER — METOCLOPRAMIDE HCL 10 MG PO TABS: 10.0000 mg | ORAL_TABLET | Freq: Once | ORAL | Status: DC

## 2017-09-02 MED ORDER — PENICILLIN G POT IN DEXTROSE 60000 UNIT/ML IV SOLN
3.0000 10*6.[IU] | INTRAVENOUS | Status: DC
  Administered 2017-09-02 (×2): 3 10*6.[IU] via INTRAVENOUS
  Filled 2017-09-02 (×4): qty 50

## 2017-09-02 MED ORDER — MISOPROSTOL 50MCG HALF TABLET
50.0000 ug | ORAL_TABLET | ORAL | Status: DC
  Administered 2017-09-02: 50 ug via BUCCAL
  Filled 2017-09-02 (×5): qty 1

## 2017-09-02 MED ORDER — FAMOTIDINE 20 MG PO TABS: 40.0000 mg | ORAL_TABLET | Freq: Once | ORAL | Status: DC

## 2017-09-02 MED ORDER — TERBUTALINE SULFATE 1 MG/ML IJ SOLN
0.2500 mg | Freq: Once | INTRAMUSCULAR | Status: DC | PRN
  Filled 2017-09-02: qty 1

## 2017-09-02 MED ORDER — SIMETHICONE 80 MG PO CHEW: 80.0000 mg | CHEWABLE_TABLET | ORAL | Status: DC | PRN

## 2017-09-02 MED ORDER — PHENYLEPHRINE 40 MCG/ML (10ML) SYRINGE FOR IV PUSH (FOR BLOOD PRESSURE SUPPORT)
80.0000 ug | PREFILLED_SYRINGE | INTRAVENOUS | Status: DC | PRN
  Filled 2017-09-02: qty 5

## 2017-09-02 MED ORDER — SODIUM CHLORIDE 0.9 % IV SOLN
5.0000 10*6.[IU] | Freq: Once | INTRAVENOUS | Status: AC
  Administered 2017-09-02: 5 10*6.[IU] via INTRAVENOUS
  Filled 2017-09-02: qty 5

## 2017-09-02 MED ORDER — OXYTOCIN 40 UNITS IN LACTATED RINGERS INFUSION - SIMPLE MED
2.5000 [IU]/h | INTRAVENOUS | Status: DC
  Filled 2017-09-02: qty 1000

## 2017-09-02 MED ORDER — EPHEDRINE 5 MG/ML INJ
10.0000 mg | INTRAVENOUS | Status: DC | PRN
  Filled 2017-09-02: qty 2

## 2017-09-02 MED ORDER — LIDOCAINE HCL (PF) 1 % IJ SOLN
INTRAMUSCULAR | Status: DC | PRN
  Administered 2017-09-02: 4 mL via EPIDURAL
  Administered 2017-09-02: 6 mL via EPIDURAL

## 2017-09-02 MED ORDER — ONDANSETRON HCL 4 MG/2ML IJ SOLN
4.0000 mg | Freq: Four times a day (QID) | INTRAMUSCULAR | Status: DC | PRN
  Administered 2017-09-02: 4 mg via INTRAVENOUS
  Filled 2017-09-02: qty 2

## 2017-09-02 MED ORDER — ACETAMINOPHEN 325 MG PO TABS
650.0000 mg | ORAL_TABLET | ORAL | Status: DC | PRN
  Administered 2017-09-03: 650 mg via ORAL
  Filled 2017-09-02 (×2): qty 2

## 2017-09-02 MED ORDER — ONDANSETRON HCL 4 MG/2ML IJ SOLN: 4.0000 mg | INTRAMUSCULAR | Status: DC | PRN

## 2017-09-02 MED ORDER — BENZOCAINE-MENTHOL 20-0.5 % EX AERO: 1.0000 "application " | INHALATION_SPRAY | CUTANEOUS | Status: DC | PRN

## 2017-09-02 MED ORDER — DIPHENHYDRAMINE HCL 50 MG/ML IJ SOLN: 12.5000 mg | INTRAMUSCULAR | Status: DC | PRN

## 2017-09-02 MED ORDER — ZOLPIDEM TARTRATE 5 MG PO TABS: 5.0000 mg | ORAL_TABLET | Freq: Every evening | ORAL | Status: DC | PRN

## 2017-09-02 MED ORDER — WITCH HAZEL-GLYCERIN EX PADS: 1.0000 "application " | MEDICATED_PAD | CUTANEOUS | Status: DC | PRN

## 2017-09-02 MED ORDER — DIBUCAINE 1 % RE OINT: 1.0000 "application " | TOPICAL_OINTMENT | RECTAL | Status: DC | PRN

## 2017-09-02 MED ORDER — IBUPROFEN 600 MG PO TABS
600.0000 mg | ORAL_TABLET | Freq: Four times a day (QID) | ORAL | Status: DC
  Administered 2017-09-02 – 2017-09-04 (×6): 600 mg via ORAL
  Filled 2017-09-02 (×6): qty 1

## 2017-09-02 MED ORDER — COCONUT OIL OIL: 1.0000 "application " | TOPICAL_OIL | Status: DC | PRN

## 2017-09-02 MED ORDER — FENTANYL 2.5 MCG/ML BUPIVACAINE 1/10 % EPIDURAL INFUSION (WH - ANES)
14.0000 mL/h | INTRAMUSCULAR | Status: DC | PRN
  Administered 2017-09-02: 14 mL/h via EPIDURAL
  Filled 2017-09-02: qty 100

## 2017-09-02 MED ORDER — LACTATED RINGERS IV SOLN: INTRAVENOUS | Status: DC

## 2017-09-02 MED ORDER — FENTANYL CITRATE (PF) 100 MCG/2ML IJ SOLN: 100.0000 ug | INTRAMUSCULAR | Status: DC | PRN

## 2017-09-02 MED ORDER — PHENYLEPHRINE 40 MCG/ML (10ML) SYRINGE FOR IV PUSH (FOR BLOOD PRESSURE SUPPORT)
80.0000 ug | PREFILLED_SYRINGE | INTRAVENOUS | Status: DC | PRN
  Filled 2017-09-02: qty 5
  Filled 2017-09-02: qty 10

## 2017-09-02 MED ORDER — MISOPROSTOL 25 MCG QUARTER TABLET
25.0000 ug | ORAL_TABLET | ORAL | Status: DC | PRN
  Administered 2017-09-02: 25 ug via VAGINAL
  Filled 2017-09-02: qty 1

## 2017-09-02 MED ORDER — LACTATED RINGERS IV SOLN
INTRAVENOUS | Status: DC
  Administered 2017-09-02 (×2): via INTRAVENOUS

## 2017-09-02 MED ORDER — LIDOCAINE HCL (PF) 1 % IJ SOLN
30.0000 mL | INTRAMUSCULAR | Status: DC | PRN
  Filled 2017-09-02: qty 30

## 2017-09-02 MED ORDER — SOD CITRATE-CITRIC ACID 500-334 MG/5ML PO SOLN
30.0000 mL | ORAL | Status: DC | PRN
  Administered 2017-09-02: 30 mL via ORAL
  Filled 2017-09-02: qty 15

## 2017-09-02 MED ORDER — PRENATAL MULTIVITAMIN CH
1.0000 | ORAL_TABLET | Freq: Every day | ORAL | Status: DC
  Administered 2017-09-03: 1 via ORAL
  Filled 2017-09-02: qty 1

## 2017-09-02 MED ORDER — DIPHENHYDRAMINE HCL 25 MG PO CAPS: 25.0000 mg | ORAL_CAPSULE | Freq: Four times a day (QID) | ORAL | Status: DC | PRN

## 2017-09-02 MED ORDER — TETANUS-DIPHTH-ACELL PERTUSSIS 5-2.5-18.5 LF-MCG/0.5 IM SUSP: 0.5000 mL | Freq: Once | INTRAMUSCULAR | Status: DC

## 2017-09-02 MED ORDER — SENNOSIDES-DOCUSATE SODIUM 8.6-50 MG PO TABS
2.0000 | ORAL_TABLET | ORAL | Status: DC
  Administered 2017-09-02 – 2017-09-04 (×2): 2 via ORAL
  Filled 2017-09-02 (×2): qty 2

## 2017-09-02 MED ORDER — ACETAMINOPHEN 325 MG PO TABS: 650.0000 mg | ORAL_TABLET | ORAL | Status: DC | PRN

## 2017-09-02 MED ORDER — LACTATED RINGERS IV SOLN
500.0000 mL | Freq: Once | INTRAVENOUS | Status: AC
  Administered 2017-09-02: 500 mL via INTRAVENOUS

## 2017-09-02 MED ORDER — ONDANSETRON HCL 4 MG PO TABS: 4.0000 mg | ORAL_TABLET | ORAL | Status: DC | PRN

## 2017-09-02 MED ORDER — OXYTOCIN BOLUS FROM INFUSION
500.0000 mL | Freq: Once | INTRAVENOUS | Status: AC
  Administered 2017-09-02: 500 mL via INTRAVENOUS

## 2017-09-02 MED ORDER — LACTATED RINGERS IV SOLN: 500.0000 mL | INTRAVENOUS | Status: DC | PRN

## 2017-09-02 NOTE — H&P (Addendum)
LABOR AND DELIVERY ADMISSION HISTORY AND PHYSICAL NOTE  Beverly Anthony is a 22 y.o. female [phone] with IUP at [redacted]w[redacted]d by LMP/13 wk [place] presenting for IOL for post-dates.  She reports positive fetal movement. She denies leakage of fluid or vaginal bleeding.  Prenatal History/Complications: PNC at [facility] Pregnancy complications:  - sickle cell trait - hx cholelithiasis - GBS bacteriuria  Past Medical History: Past Medical History:  Diagnosis Date  . Obesity   . UTI (urinary tract infection)     Past Surgical History: Past Surgical History:  Procedure Laterality Date  . EXTERNAL EAR SURGERY Bilateral 2017   keloid removal  . keloid     removal on ear    Obstetrical History: OB History    Gravida  4   Para  2   Term  2   Preterm  0   AB  1   Living  2     SAB  1   TAB  0   Ectopic  0   Multiple  0   Live Births  2        Obstetric Comments  03/12/14 SAB 12 wks        Social History: Social History   Socioeconomic History  . Marital status: Married    Spouse name: Not on file  . Number of children: Not on file  . Years of education: Not on file  . Highest education level: Not on file  Occupational History  . Not on file  Social Needs  . Financial resource strain: Not on file  . Food insecurity:    Worry: Not on file    Inability: Not on file  . Transportation needs:    Medical: Not on file    Non-medical: Not on file  Tobacco Use  . Smoking status: Never Smoker  . Smokeless tobacco: Never Used  Substance and Sexual Activity  . Alcohol use: No  . Drug use: No  . Sexual activity: Yes  Lifestyle  . Physical activity:    Days per week: Not on file    Minutes per session: Not on file  . Stress: Not on file  Relationships  . Social connections:    Talks on phone: Not on file    Gets together: Not on file    Attends religious service: Not on file    Active member of club or organization: Not on file    Attends meetings of clubs or  organizations: Not on file    Relationship status: Not on file  Other Topics Concern  . Not on file  Social History Narrative  . Not on file    Family History: Family History  Problem Relation Age of Onset  . Cancer Paternal Grandfather   . Diabetes Father   . Hypertension Father     Allergies: No Known Allergies  Medications Prior to Admission  Medication Sig Dispense Refill Last Dose  . acetaminophen (TYLENOL) 500 MG tablet Take 500 mg by mouth every 6 (six) hours as needed for mild pain.   09/01/2017 at Unknown time  . Prenatal MV-Min-FA-Omega-3 (PRENATAL GUMMIES/DHA & FA PO) Take 1 tablet by mouth daily.    09/01/2017 at Unknown time  . ondansetron (ZOFRAN-ODT) 8 MG disintegrating tablet Take 1 tablet (8 mg total) by mouth every 8 (eight) hours as needed for nausea or vomiting. (Patient not taking: Reported on 08/28/2017) 20 tablet 0 Not Taking     Review of Systems  All systems reviewed and negative  except as stated in HPI  Physical Exam Blood pressure 128/70, pulse 93, temperature 97.9 F (36.6 C), temperature source Oral, resp. rate 16, height 5\' 2"  (1.575 m), weight 119.3 kg (263 lb), last menstrual period 11/19/2016, unknown if currently breastfeeding. General appearance: alert, oriented, NAD Lungs: normal respiratory effort Heart: regular rate Abdomen: soft, non-tender; gravid, FH appropriate for GA Extremities: No calf swelling or tenderness Presentation: cephalic Fetal monitoring: 130 bpm baseline/moderate variability/+ accel, no decel Uterine activity: irregular Dilation: 2 Effacement (%): 50 Station: -2 Exam by:: J.Follmer,RNC  Prenatal labs: ABO, Rh:  B POS Antibody:  Negative Rubella: 1.58 (04/01 1017) RPR: Non Reactive (04/01 0913)  HBsAg: Negative (04/01 1017)  HIV: Non Reactive (04/01 0913)  GC/Chlamydia: negative GBS:   positive by bacteriuria 2-hr GTT: negative (74/99/65) Genetic screening:  declined Anatomy US: normal female  Prenatal  Transfer Tool  Maternal Diabetes: No Genetic Screening: Declined Maternal Ultrasounds/Referrals: Normal Fetal Ultrasounds or other Referrals:  None Maternal Substance Abuse:  No Significant Maternal Medications:  None Significant Maternal Lab Results: Lab values include: Group B Strep positive  Results for orders placed or performed during the hospital encounter of 09/02/17 (from the past 24 hour(s))  CBC   Collection Time: 09/02/17  8:03 AM  Result Value Ref Range   WBC 12.1 (H) 4.0 - 10.5 K/uL   RBC 4.44 3.87 - 5.11 MIL/uL   Hemoglobin 12.0 12.0 - 15.0 g/dL   HCT 09.835.1 (L) 11.936.0 - 14.746.0 %   MCV 79.1 78.0 - 100.0 fL   MCH 27.0 26.0 - 34.0 pg   MCHC 34.2 30.0 - 36.0 g/dL   RDW 82.915.3 56.211.5 - 13.015.5 %   Platelets 216 150 - 400 K/uL    Patient Active Problem List   Diagnosis Date Noted  . Post-dates pregnancy 09/02/2017  . Unwanted fertility 08/15/2017  . Supervision of normal pregnancy 04/28/2017  . GBS bacteriuria 04/18/2017  . Gallbladder stone without cholecystitis or obstruction 02/09/2015  . Sickle cell trait (HCC) 01/10/2015    Assessment: Beverly [place] Anthony is a 22 y.o. [id number] at 3027w0d here for IOL for post-dates.  #IOL: SVE on admission 2/50-3. 25 mcg Cytotec vaginally and FB placed at 0900. Plan for pitocin and AROM when appropriate. Anticipate SVD. #Pain: Per patient request #FWB: Cat 1 FHT #ID:  GBS positive, PCN ordered #MOF: breast and bottle #MOC: BTL, papers signed 06/19/17 #Circ:  unsure  GrenadaBrittany P Hipkins 09/02/2017, 8:42 AM  Midwife attestation: I have seen and examined this patient; I agree with above documentation in the resident's note.   Beverly [place] Sharlene Anthony is a 22 y.o. [phone] here for IOL for postdates  PE: Gen: calm comfortable, NAD Resp: normal effort, no distress Abd: gravid  ROS, labs, PMH reviewed  Assessment/Plan: Admit to LD Labor: latent FWB: Cat I ID: GBS pos>PCN  Donette LarryMelanie Joice Nazario, CNM  09/02/2017, 10:05 AM

## 2017-09-02 NOTE — Anesthesia Pain Management Evaluation Note (Signed)
  CRNA Pain Management Visit Note  Patient: Beverly Anthony, 22 y.o., female  "Hello I am a member of the anesthesia team at [facility]. We have an anesthesia team available at all times to provide care throughout the hospital, including epidural management and anesthesia for C-section. I don't know your plan for the delivery whether it a natural birth, water birth, IV sedation, nitrous supplementation, doula or epidural, but we want to meet your pain goals."   1.Was your pain managed to your expectations on prior hospitalizations?   Yes   2.What is your expectation for pain management during this hospitalization?     Epidural  3.How can we help you reach that goal? unsure  Record the patient's initial score and the patient's pain goal.   Pain: 6  Pain Goal: 10 [facility] wants you to be able to say your pain was always managed very well.  Cephus ShellingBURGER,Beverly Anthony 09/02/2017

## 2017-09-02 NOTE — Anesthesia Preprocedure Evaluation (Signed)
Anesthesia Evaluation  Patient identified by MRN, date of birth, ID band Patient awake    Reviewed: Allergy & Precautions, H&P , NPO status , Patient's Chart, lab work & pertinent test results  Airway Mallampati: II  TM Distance: >3 FB Neck ROM: Full    Dental no notable dental hx.    Pulmonary neg pulmonary ROS,    Pulmonary exam normal breath sounds clear to auscultation       Cardiovascular negative cardio ROS Normal cardiovascular exam Rhythm:Regular Rate:Normal     Neuro/Psych negative neurological ROS  negative psych ROS   GI/Hepatic negative GI ROS, Neg liver ROS,   Endo/Other  Morbid obesity  Renal/GU negative Renal ROS  negative genitourinary   Musculoskeletal negative musculoskeletal ROS (+)   Abdominal (+) + obese,   Peds  Hematology  (+) Sickle cell trait and anemia ,   Anesthesia Other Findings   Reproductive/Obstetrics (+) Pregnancy                             Anesthesia Physical  Anesthesia Plan  ASA: III  Anesthesia Plan: Epidural   Post-op Pain Management:    Induction:   PONV Risk Score and Plan:   Airway Management Planned: Natural Airway  Additional Equipment: None  Intra-op Plan:   Post-operative Plan:   Informed Consent: I have reviewed the patients History and Physical, chart, labs and discussed the procedure including the risks, benefits and alternatives for the proposed anesthesia with the patient or authorized representative who has indicated his/her understanding and acceptance.     Plan Discussed with: Anesthesiologist  Anesthesia Plan Comments: (Labs reviewed. Platelets acceptable, patient not taking any blood thinning medications. Risks and benefits discussed with patient, patient expressed understanding and wished to proceed.)        Anesthesia Quick Evaluation

## 2017-09-02 NOTE — Progress Notes (Signed)
Labor Progress Note [name] Azucena is a 22 y.o. [id number] at 7324w0d presented for IOL for post-dates. S: s/p foley bulb, feeling contractions through epidural placement.   O:  BP 110/67   Pulse 93   Temp 97.9 F (36.6 C) (Oral)   Resp 16   Ht 5\' 2"  (1.575 m)   Wt 119.3 kg (263 lb)   LMP 11/19/2016   SpO2 98%   BMI 48.10 kg/m  EFM: 125 bpm/moderate variability/+ accel, no decel  CVE: Dilation: 6 Effacement (%): 70 Station: -2 Presentation: Vertex Exam by:: J.Follmer,RNC   A&P: 22 y.o. Y7W2956G4P2012 2224w0d presenting for IOL for post-dates. #Labor: s/p FB. Now 6/70/-2. AROM at 1715 with light med stained fluid.   #Pain: s/p epidural placement.#FWB: Cat 1 FHT #GBS positive - PCN  Felicita GageBrittany P Sparkles Mcneely, MD 5:17 PM

## 2017-09-02 NOTE — Progress Notes (Signed)
Labor Progress Note Beverly Anthony is a 22 y.o. [id number] at 6870w0d presented for IOL for post-dates. S: FB remains in place; increasing intensity with contractions, patient is receiving epidural placement now.   O:  BP 114/69   Pulse 76   Temp 98.2 F (36.8 C) (Oral)   Resp 16   Ht 5\' 2"  (1.575 m)   Wt 119.3 kg (263 lb)   LMP 11/19/2016   BMI 48.10 kg/m  EFM: 120 bpm/moderate variability/+ accel, no decel  CVE: Dilation: 2 Effacement (%): 50 Station: -2 Presentation: Vertex Exam by:: Aniruddh Ciavarella, MD residient   A&P: 22 y.o. [id number] 6770w0d presenting for IOL for post-dates. #Labor: FB remains in place. Plan for pitocin and AROM when appropriate.  #Pain: epidural placement now #FWB: Cat 1 FHT #GBS positive - PCN  Beverly P Alinah Sheard, MD 1:10 PM

## 2017-09-02 NOTE — Anesthesia Procedure Notes (Signed)
Epidural Patient location during procedure: OB Start time: 09/02/2017 1:06 PM End time: 09/02/2017 1:10 PM  Staffing Anesthesiologist: Beryle LatheBrock, Thomas E, MD Performed: anesthesiologist   Preanesthetic Checklist Completed: patient identified, pre-op evaluation, timeout performed, IV checked, risks and benefits discussed and monitors and equipment checked  Epidural Patient position: sitting Prep: DuraPrep Patient monitoring: continuous pulse ox and blood pressure Approach: midline Location: L2-L3 Injection technique: LOR saline  Needle:  Needle type: Tuohy  Needle gauge: 17 G Needle length: 9 cm Needle insertion depth: 7 cm Catheter size: 19 Gauge Catheter at skin depth: 12 cm Test dose: negative and Other (1% lidocaine)  Additional Notes Patient identified. Risks including, but not limited to, bleeding, infection, nerve damage, paralysis, inadequate analgesia, blood pressure changes, nausea, vomiting, allergic reaction, postpartum back pain, itching, and headache were discussed. Patient expressed understanding and wished to proceed. Sterile prep and drape, including hand hygiene, mask, and sterile gloves were used. The patient was positioned and the spine was prepped. The skin was anesthetized with lidocaine. No paraesthesia or other complication noted. The patient did not experience any signs of intravascular injection such as tinnitus or metallic taste in mouth, nor signs of intrathecal spread such as rapid motor block. Please see nursing notes for vital signs. The patient tolerated the procedure well.   Leslye Peerhomas Brock, MDReason for block:procedure for pain

## 2017-09-03 ENCOUNTER — Encounter (HOSPITAL_COMMUNITY)
Admission: RE | Disposition: A | Discharge status: Home / Self Care | Payer: Self-pay | Source: Ambulatory Visit | Attending: Obstetrics and Gynecology

## 2017-09-03 ENCOUNTER — Encounter (HOSPITAL_COMMUNITY): Payer: Self-pay

## 2017-09-03 LAB — RPR: RPR: NONREACTIVE

## 2017-09-03 SURGERY — LIGATION, FALLOPIAN TUBE, POSTPARTUM
Anesthesia: Choice

## 2017-09-03 NOTE — Progress Notes (Signed)
L&D notified to please come pull epidural.  (Patient has decided not to have tubal ligation)

## 2017-09-03 NOTE — Plan of Care (Signed)
Patient is alert and oriented x4. Her fundus is firm and small amount of blood is noted on her Peri-pad. Patient denies any pain or discomfort at this time.

## 2017-09-03 NOTE — Anesthesia Postprocedure Evaluation (Signed)
Anesthesia Post Note  Patient: Beverly Anthony  Procedure(s) Performed: AN AD HOC LABOR EPIDURAL     Patient location during evaluation: Mother Baby Anesthesia Type: Epidural Level of consciousness: awake Pain management: satisfactory to patient Vital Signs Assessment: post-procedure vital signs reviewed and stable Respiratory status: spontaneous breathing Cardiovascular status: stable Anesthetic complications: no    Last Vitals:  Vitals:   09/03/17 0217 09/03/17 0511  BP: (!) 102/50 (!) 97/50  Pulse: 89 63  Resp: 18 18  Temp: 37.1 C 36.5 C  SpO2: 99% 98%    Last Pain:  Vitals:   09/03/17 0511  TempSrc: Oral  PainSc:    Pain Goal:                 Beverly Anthony,Beverly Anthony

## 2017-09-03 NOTE — Progress Notes (Signed)
Post Partum Day 1 Subjective: no complaints, up ad lib, voiding and tolerating PO  No longer desires BTL. Wants another form of contraception. Has never been on birth control.     Objective: Blood pressure (!) 97/50, pulse 63, temperature 97.7 F (36.5 C), temperature source Oral, resp. rate 18, height 5\' 2"  (1.575 m), weight 119.3 kg (263 lb), last menstrual period 11/19/2016, SpO2 98 %, unknown if currently breastfeeding.  Physical Exam:  General: alert, cooperative and no distress Lochia: appropriate Uterine Fundus: firm Incision: N/A DVT Evaluation: No evidence of DVT seen on physical exam. No cords or calf tenderness. Calf/Ankle edema is present.  Recent Labs    09/02/17 0803  HGB 12.0  HCT 35.1*    Assessment/Plan: Plan for discharge tomorrow  Breastfeeding Lactation consultation Discussed contraceptions options. Patient thinking about Nexplanon. Outpatient circ   LOS: 1 day   Caryl AdaJazma Obi Scrima, DO 09/03/2017, 7:42 AM

## 2017-09-03 NOTE — Lactation Note (Signed)
This note was copied from a baby's chart. Lactation Consultation Note Baby 8 hrs old. Baby STS w/mom, has low temp.  Baby BF well. Mom has large pendulous breast w/flat very compressible nipples. Baby able to maintain latch well. Heard swallows.  Hand expression w/milk squirting across the bed. Hand expressed 8 ml colostrum, spoon fed baby. Baby alert. FOB took baby STS after feeding completed.  Reviewed newborn behavior, feeding habits, STS, I&O, cluster feeding. Encouraged mom to pumping am d/t all the colostrum mom has. Mom stated she is so sleepy right now and needs to rest. Mom has 2 yr that she BF a couple of months.  Mom encouraged to feed baby 8-12 times/24 hours and with feeding cues. WH/LC brochure given w/resources, support groups and LC services.   Patient Name: Beverly Anthony Today's Date: 09/03/2017 Reason for consult: Initial assessment   Maternal Data    Feeding Feeding Type: Breast Fed Length of feed: 30 min  LATCH Score Latch: Repeated attempts needed to sustain latch, nipple held in mouth throughout feeding, stimulation needed to elicit sucking reflex.  Audible Swallowing: A few with stimulation  Type of Nipple: Flat  Comfort (Breast/Nipple): Soft / non-tender  Hold (Positioning): Assistance needed to correctly position infant at breast and maintain latch.  LATCH Score: 6  Interventions Interventions: (lACTATION CONSULTED )  Lactation Tools Discussed/Used     Consult Status      Beverly Anthony, Beverly NickelLAURA G 09/03/2017, 12:44 AM

## 2017-09-03 NOTE — Progress Notes (Signed)
Nurse called into room.  Mom has decided that she does not want tubal ligation; plans to discuss with MD other forms in the morning.  Dr Doroteo GlassmanPhelps notified, as was OR.  Tubal cancelled.

## 2017-09-04 ENCOUNTER — Ambulatory Visit: Payer: Self-pay

## 2017-09-04 MED ORDER — IBUPROFEN 600 MG PO TABS: 600.0000 mg | ORAL_TABLET | Freq: Four times a day (QID) | ORAL | 0 refills | Status: DC

## 2017-09-04 NOTE — Lactation Note (Signed)
This note was copied from a baby's chart. Lactation Consultation Note  Patient Name: Beverly Anthony Today's Date: 09/04/2017   Reason for consult: Follow-up assessment  Mom P3 with close spaced pregnancies . Dad was doing skin to skin with infant [facility] and infant was giving hunger cues. Large breast with flat nipples but very compressible tisue.  Mom report giving infant 15ml of EBM in bottle at 6 am. Mom  Informed LC that she desires to put infant to breast. LC assisted Mom latching infant to breast in  cross cradle position. Mom hand expressed colostrum and LC help infant assist with wide open gap and importance of flange lips of  infant to breast, audile swallows was heard. Infant sustained a  10 min. latch with additional 18 minutes latch  mom was  still breastfeeding when LC left room. When infant release breast nipple was rounded without pressure areas or trauma. Mom taught back that she will offer infant breast 8 to 12 times within 24 hours, use skin to skin, breast compression when feeding infant , offer breast anytime she sees hunger cues. Mom will put infant to breast then  post pump after each feeding 15-20 minutes offering EBM / formula. Discuss treatment and prevention of engorgement Mom [facility].  Informed Mom of available [facility] services WH, out patient department,  Breastfeeding support groups and community resources and [facility] phone number.   Maternal Data    Feeding Feeding Type: Breast Fed Nipple Type: Slow - flow Length of feed: 18 min(Mom still feeding when LC left room)  LATCH Score Latch: Grasps breast easily, tongue down, lips flanged, rhythmical sucking.  Audible Swallowing: Spontaneous and intermittent  Type of Nipple: Flat  Comfort (Breast/Nipple): Soft / non-tender  Hold (Positioning): Assistance needed to correctly position infant at breast and maintain latch.  LATCH Score: 8  Interventions Interventions: Breast  feeding basics reviewed;Assisted with latch;Skin to skin;Breast massage;Hand express;Pre-pump if needed;Breast compression;Adjust position;Support pillows;Position options;Expressed milk;Shells;Hand pump;Ice  Lactation Tools Discussed/Used WIC Program: Yes Initiated by:: Stevan BornSherry Kendrick RN,IBCLC Date initiated:: 09/04/17   Consult Status Consult Status: Follow-up Date: 09/05/17 Follow-up type: In-patient    Danelle EarthlyRobin Khiley Lieser 09/04/2017, 9:48 AM

## 2017-09-04 NOTE — Discharge Summary (Addendum)
OB Discharge Summary     Patient Name: Beverly Anthony DOB: 1995-06-24 MRN: [id number]  Date of admission: 09/02/2017 Delivering MD: Lilly Cove P   Date of discharge: 09/04/2017  Admitting diagnosis: Induction 41 wks Intrauterine pregnancy: [redacted]w[redacted]d     Secondary diagnosis:  Active Problems:   Post-dates pregnancy   Spontaneous vaginal delivery  Additional problems: none     Discharge diagnosis: Term Pregnancy Delivered                                                                                                Post partum procedures:none  Augmentation: AROM and Cytotec  Complications: None  Hospital course:  Induction of Labor With Vaginal Delivery   22 y.o. yo [phone] at [redacted]w[redacted]d was admitted to the hospital 09/02/2017 for induction of labor.  Indication for induction: Postdates.  She was induced with cytotec and AROM. Patient had an uncomplicated labor course as follows: Membrane Rupture Time/Date: 5:11 PM ,09/02/2017   Intrapartum Procedures: Episiotomy: None [1]                                         Lacerations:  None [1]  Patient had delivery of a Viable infant.  Information for the patient's newborn:  Beverly Anthony, Beverly Anthony [place] [[id number]]  Delivery Method: Vaginal, Spontaneous(Filed from Delivery Summary)   09/02/2017  Details of delivery can be found in separate delivery note.  Patient had a routine postpartum course. Patient is discharged home 09/04/17.  Physical exam  Vitals:   09/03/17 0511 09/03/17 0900 09/03/17 2200 09/04/17 0500  BP: (!) 97/50 111/63 140/84 128/83  Pulse: 63 84 90 69  Resp: 18 16 16 16   Temp: 97.7 F (36.5 C) 98.2 F (36.8 C) 98.7 F (37.1 C) 97.6 F (36.4 C)  TempSrc: Oral Oral Oral   SpO2: 98%  99% 93%  Weight:      Height:       General: alert, cooperative and no distress Lochia: appropriate Uterine Fundus: firm Incision: N/A DVT Evaluation: No evidence of DVT seen on physical exam. No cords or calf tenderness. No significant  calf/ankle edema. Labs: Lab Results  Component Value Date   WBC 12.1 (H) 09/02/2017   HGB 12.0 09/02/2017   HCT 35.1 (L) 09/02/2017   MCV 79.1 09/02/2017   PLT 216 09/02/2017   CMP Latest Ref Rng & Units 02/09/2015  Glucose 65 - 99 mg/dL 130(Q)  BUN 6 - 20 mg/dL 5(L)  Creatinine 6.57 - 1.00 mg/dL 8.46  Sodium 962 - 952 mmol/L 135  Potassium 3.5 - 5.1 mmol/L 3.7  Chloride 101 - 111 mmol/L 103  CO2 22 - 32 mmol/L 24  Calcium 8.9 - 10.3 mg/dL 9.2  Total Protein 6.5 - 8.1 g/dL 7.1  Total Bilirubin 0.3 - 1.2 mg/dL 0.5  Alkaline Phos 38 - 126 U/L 122  AST 15 - 41 U/L 19  ALT 14 - 54 U/L 16    Discharge instruction: per After Visit Summary and "Baby and Me Booklet".  After visit meds:  Allergies as of 09/04/2017   No Known Allergies     Medication List    TAKE these medications   acetaminophen 500 MG tablet Commonly known as:  TYLENOL Take 500 mg by mouth every 6 (six) hours as needed for mild pain.   ibuprofen 600 MG tablet Commonly known as:  ADVIL,MOTRIN Take 1 tablet (600 mg total) by mouth every 6 (six) hours.   ondansetron 8 MG disintegrating tablet Commonly known as:  ZOFRAN-ODT Take 1 tablet (8 mg total) by mouth every 8 (eight) hours as needed for nausea or vomiting.   PRENATAL GUMMIES/DHA & FA PO Take 1 tablet by mouth daily.       Diet: routine diet  Activity: Advance as tolerated. Pelvic rest for 6 weeks.   Outpatient follow up:4 weeks Follow up Appt:No future appointments. Follow up Visit:No follow-ups on file.  Postpartum contraception: Nexplanon  Newborn Data: Live born female  Birth Weight: 6 lb 8.4 oz (2960 g) APGAR: 8, 9  Newborn Delivery   Birth date/time:  09/02/2017 18:57:00 Delivery type:  Vaginal, Spontaneous     Baby Feeding: Bottle and Breast Disposition:home with mother   09/04/2017 Felicita GageBrittany P Hipkins, MD  OB FELLOW DISCHARGE ATTESTATION  I have seen and examined this patient and agree with above documentation in the  resident's note.   Frederik PearJulie P Degele, MD OB Fellow

## 2017-09-05 ENCOUNTER — Ambulatory Visit: Payer: Self-pay

## 2017-09-05 NOTE — Lactation Note (Signed)
This note was copied from a baby's chart. Lactation Consultation Note  Patient Name: Boy [name] Rekowski Today's Date: 09/05/2017 Reason for consult: Follow-up assessment;Hyperbilirubinemia Mom is mostly pumping and bottle feeding.  She states baby can latch she just prefers to pump and bottle. Milk volume is increasing.  [facility].  She is unable to pay [facility].  Instructed on the harmony and double piston pump.  Mom declines latch assist.  Phototherapy discontinued.  Lactation outpatient services and support reviewed and encouraged prn.  Maternal Data    Feeding    LATCH Score                   Interventions    Lactation Tools Discussed/Used     Consult Status Consult Status: Complete Follow-up type: Call as needed    Huston FoleyMOULDEN, Karalynn Cottone S 09/05/2017, 10:21 AM

## 2017-10-09 ENCOUNTER — Ambulatory Visit: Payer: Medicaid Other | Admitting: Advanced Practice Midwife

## 2017-10-11 ENCOUNTER — Other Ambulatory Visit: Payer: Self-pay

## 2017-10-11 ENCOUNTER — Encounter (HOSPITAL_COMMUNITY): Payer: Self-pay | Admitting: *Deleted

## 2017-10-11 ENCOUNTER — Emergency Department (HOSPITAL_COMMUNITY)
Admission: EM | Admit: 2017-10-11 | Discharge: 2017-10-11 | Disposition: A | Discharge status: Home / Self Care | Payer: Medicaid Other | Attending: Emergency Medicine | Admitting: Emergency Medicine

## 2017-10-11 DIAGNOSIS — Z79899 Other long term (current) drug therapy: Secondary | ICD-10-CM | POA: Insufficient documentation

## 2017-10-11 DIAGNOSIS — B3789 Other sites of candidiasis: Secondary | ICD-10-CM

## 2017-10-11 DIAGNOSIS — R21 Rash and other nonspecific skin eruption: Secondary | ICD-10-CM | POA: Diagnosis present

## 2017-10-11 DIAGNOSIS — Z3202 Encounter for pregnancy test, result negative: Secondary | ICD-10-CM | POA: Diagnosis not present

## 2017-10-11 LAB — CBC WITH DIFFERENTIAL/PLATELET
ABS IMMATURE GRANULOCYTES: 0 10*3/uL (ref 0.0–0.1)
BASOS ABS: 0 10*3/uL (ref 0.0–0.1)
BASOS PCT: 0 %
EOS ABS: 0.2 10*3/uL (ref 0.0–0.7)
Eosinophils Relative: 2 %
HCT: 38.3 % (ref 36.0–46.0)
Hemoglobin: 11.9 g/dL — ABNORMAL LOW (ref 12.0–15.0)
Immature Granulocytes: 0 %
Lymphocytes Relative: 29 %
Lymphs Abs: 2.8 10*3/uL (ref 0.7–4.0)
MCH: 25.3 pg — ABNORMAL LOW (ref 26.0–34.0)
MCHC: 31.1 g/dL (ref 30.0–36.0)
MCV: 81.5 fL (ref 78.0–100.0)
MONO ABS: 0.5 10*3/uL (ref 0.1–1.0)
Monocytes Relative: 5 %
NEUTROS ABS: 6.2 10*3/uL (ref 1.7–7.7)
NEUTROS PCT: 64 %
PLATELETS: 312 10*3/uL (ref 150–400)
RBC: 4.7 MIL/uL (ref 3.87–5.11)
RDW: 14.3 % (ref 11.5–15.5)
WBC: 9.7 10*3/uL (ref 4.0–10.5)

## 2017-10-11 LAB — HCG, QUANTITATIVE, PREGNANCY: HCG, BETA CHAIN, QUANT, S: 1 m[IU]/mL (ref <5)

## 2017-10-11 MED ORDER — NYSTATIN 100000 UNIT/GM EX POWD: Freq: Three times a day (TID) | CUTANEOUS | 0 refills | Status: DC

## 2017-10-11 NOTE — ED Provider Notes (Addendum)
Patient placed in Quick Look pathway, seen and evaluated   Chief Complaint:rash  HPI:   [name] Beverly Anthony is a 22 y.o. female who presents to the ED for a rash under her breast. Patient c/o itching and irritation.  Patient s/p SVD 4 weeks ago. She did breast feed until about 2 weeks ago. Patient using no birth control. Started back having sex one week ago.   ROS: skin: rash  Physical Exam:  BP 107/74 (BP Location: Right Arm)   Pulse 69   Temp 98.6 F (37 C) (Oral)   Resp 14   LMP 11/19/2016   SpO2 100%    Gen: No distress  Neuro: Awake and Alert  Skin: erythema and irritation and rash under breasts c/w monilia.    Initiation of care has begun. The patient has been counseled on the process, plan, and necessity for staying for the completion/evaluation, and the remainder of the medical screening examination    Beverly Anthony, Beverly M, NP 10/11/17 1715    Beverly Anthony, Beverly AkinsM, NP 10/11/17 1751    Beverly Anthony, Beverly East GillespieM, NP 10/14/17 1606    Beverly Anthony, Stephen, MD 10/15/17 [id number]

## 2017-10-11 NOTE — Discharge Instructions (Signed)
Your rash is consistent with a fungal infection.  Please follow attached handout.  Please use medication as prescribed.  Please keep the area clean and dry.  Follow up with your obgyn tomorrow.  Your pregnancy test was negative today.  If you develop worsening or new concerning symptoms you can return to the emergency department for re-evaluation.

## 2017-10-11 NOTE — ED Triage Notes (Signed)
Pt in stating she had a positive home pregnancy test, pt just had a baby 4 weeks ago, also c/o rash under bilateral breasts

## 2017-10-11 NOTE — ED Provider Notes (Addendum)
[facility] EMERGENCY DEPARTMENT Provider Note   CSN: [id number] Arrival date & time: 10/11/17  1656     History   Chief Complaint Chief Complaint  Patient presents with  . Possible Pregnancy  . Rash    HPI [place] Wisinski is a 22 y.o. female who is recently status post SVD 4 weeks ago who presents emergency department today for rash underneath her left breast for the last 1-2 weeks.  Patient reports that she has a rash underneath the fold of her left breast that is erythematous and pruritic in nature.  She has not been trying anything for her rash.  She reports that she did breast-feed for the first 2 weeks but is not since that time.  Patient reports she does have a follow-up with OB/GYN tomorrow.  She reports that nothing makes her symptoms better or worse.  She denies any current pain.  She also reports that she took a home pregnancy test was positive.  She did start having intercourse with her husband 1 week ago and is not on any birth control.  She is requesting confirmatory testing for this.  She denies any abdominal pain or vaginal bleeding.  No urinary symptoms.  HPI  Past Medical History:  Diagnosis Date  . Obesity   . UTI (urinary tract infection)     Patient Active Problem List   Diagnosis Date Noted  . Post-dates pregnancy 09/02/2017  . Spontaneous vaginal delivery 09/02/2017  . Unwanted fertility 08/15/2017  . Supervision of normal pregnancy 04/28/2017  . GBS bacteriuria 04/18/2017  . Gallbladder stone without cholecystitis or obstruction 02/09/2015  . Sickle cell trait (HCC) 01/10/2015    Past Surgical History:  Procedure Laterality Date  . EXTERNAL EAR SURGERY Bilateral 2017   keloid removal  . keloid     removal on ear     OB History    Gravida  4   Para  3   Term  3   Preterm  0   AB  1   Living  3     SAB  1   TAB  0   Ectopic  0   Multiple  0   Live Births  3        Obstetric Comments  03/12/14 SAB 12 wks           Home Medications    Prior to Admission medications   Medication Sig Start Date End Date Taking? Authorizing Provider  acetaminophen (TYLENOL) 500 MG tablet Take 500 mg by mouth every 6 (six) hours as needed for mild pain.    [provider]  ibuprofen (ADVIL,MOTRIN) 600 MG tablet Take 1 tablet (600 mg total) by mouth every 6 (six) hours. 09/04/17   Hipkins, Verdene Lennert, MD  ondansetron (ZOFRAN-ODT) 8 MG disintegrating tablet Take 1 tablet (8 mg total) by mouth every 8 (eight) hours as needed for nausea or vomiting. Patient not taking: Reported on 08/28/2017 08/18/17   Arabella Merles, CNM  Prenatal MV-Min-FA-Omega-3 (PRENATAL GUMMIES/DHA & FA PO) Take 1 tablet by mouth daily.     [provider]    Family History Family History  Problem Relation Age of Onset  . Cancer Paternal Grandfather   . Diabetes Father   . Hypertension Father     Social History Social History   Tobacco Use  . Smoking status: Never Smoker  . Smokeless tobacco: Never Used  Substance Use Topics  . Alcohol use: No  . Drug use: No  Allergies   Patient has no known allergies.   Review of Systems Review of Systems  All other systems reviewed and are negative.    Physical Exam Updated Vital Signs BP 107/74 (BP Location: Right Arm)   Pulse 69   Temp 98.6 F (37 C) (Oral)   Resp 14   LMP 11/19/2016   SpO2 100%   Physical Exam  Constitutional: She appears well-developed and well-nourished.  HENT:  Head: Normocephalic and atraumatic.  Right Ear: External ear normal.  Left Ear: External ear normal.  Eyes: Conjunctivae are normal. Right eye exhibits no discharge. Left eye exhibits no discharge. No scleral icterus.  Pulmonary/Chest: Effort normal. No respiratory distress.  Chaperone Present: Patient with large area of skin changes under left breast skin fold that is consistent with candida infection. No overlying signs of cellulitis. No drainage. No weeping. No other  rash noted. No tenderness.   Abdominal: Soft. Normal appearance and bowel sounds are normal. She exhibits no distension. There is no tenderness. There is no rigidity, no rebound, no guarding and no CVA tenderness.  Neurological: She is alert.  Skin: No pallor.  Psychiatric: She has a normal mood and affect.  Nursing note and vitals reviewed.    ED Treatments / Results  Labs (all labs ordered are listed, but only abnormal results are displayed) Labs Reviewed  CBC WITH DIFFERENTIAL/PLATELET - Abnormal; Notable for the following components:      Result Value   Hemoglobin 11.9 (*)    MCH 25.3 (*)    All other components within normal limits  HCG, QUANTITATIVE, PREGNANCY    EKG None  Radiology No results found.  Procedures Procedures (including critical care time)  Medications Ordered in ED Medications - No data to display   Initial Impression / Assessment and Plan / ED Course  I have reviewed the triage vital signs and the nursing notes.  Pertinent labs & imaging results that were available during my care of the patient were reviewed by me and considered in my medical decision making (see chart for details).     22 y.o. female presenting with breast candidal infection underneath left breast fold.  There is no overlying evidence of bacterial infection.  No tenderness.  No area to make me concern for breast abscess.  Will treat with nystatin powder.  Recommended keeping the area dry.  Patient does have follow-up with her OB/GYN tomorrow.  Patient also with concerns of possible pregnancy.  Patency test here is negative.  She denies any abdominal pain, urinary symptoms, vaginal bleeding.  Vital signs are stable.  No further work-up indicated.  Will discharge home with above treatment and recommend follow-up with OB/GYN tomorrow.  All questions answered.  Strict return precautions discussed.  Patient appears safe discharge.  Final Clinical Impressions(s) / ED Diagnoses   Final  diagnoses:  Candidiasis of breast  Negative pregnancy test    ED Discharge Orders        Ordered    nystatin (MYCOSTATIN/NYSTOP) powder  3 times daily     10/11/17 1959       Princella PellegriniMaczis, Demarquez Ciolek M, PA-C 10/11/17 2000    Melene PlanFloyd, Dan, DO 10/11/17 2013    Jacinto HalimMaczis, Kriya Westra M, PA-C 10/11/17 2033    Melene PlanFloyd, Dan, DO 10/11/17 2338

## 2017-11-23 ENCOUNTER — Encounter (HOSPITAL_COMMUNITY): Payer: Self-pay | Admitting: *Deleted

## 2017-11-23 ENCOUNTER — Emergency Department (HOSPITAL_COMMUNITY)
Admission: EM | Admit: 2017-11-23 | Discharge: 2017-11-23 | Disposition: A | Discharge status: Home / Self Care | Payer: Medicaid Other | Attending: Emergency Medicine | Admitting: Emergency Medicine

## 2017-11-23 DIAGNOSIS — M7989 Other specified soft tissue disorders: Secondary | ICD-10-CM

## 2017-11-23 DIAGNOSIS — R600 Localized edema: Secondary | ICD-10-CM | POA: Insufficient documentation

## 2017-11-23 LAB — URINALYSIS, ROUTINE W REFLEX MICROSCOPIC
BILIRUBIN URINE: NEGATIVE
GLUCOSE, UA: NEGATIVE mg/dL
HGB URINE DIPSTICK: NEGATIVE
KETONES UR: NEGATIVE mg/dL
NITRITE: POSITIVE — AB
Protein, ur: NEGATIVE mg/dL
Specific Gravity, Urine: 1.013 (ref 1.005–1.030)
WBC, UA: >50 WBC/hpf — ABNORMAL HIGH (ref 0–5)
pH: 6 (ref 5.0–8.0)

## 2017-11-23 LAB — BRAIN NATRIURETIC PEPTIDE: B Natriuretic Peptide: 17.7 pg/mL (ref 0.0–100.0)

## 2017-11-23 LAB — COMPREHENSIVE METABOLIC PANEL
ALK PHOS: 99 U/L (ref 38–126)
ALT: 16 U/L (ref 0–44)
AST: 17 U/L (ref 15–41)
Albumin: 3.7 g/dL (ref 3.5–5.0)
Anion gap: 10 (ref 5–15)
BILIRUBIN TOTAL: 0.5 mg/dL (ref 0.3–1.2)
BUN: 8 mg/dL (ref 6–20)
CALCIUM: 9.5 mg/dL (ref 8.9–10.3)
CO2: 27 mmol/L (ref 22–32)
CREATININE: 0.97 mg/dL (ref 0.44–1.00)
Chloride: 107 mmol/L (ref 98–111)
GFR calc Af Amer: >60 mL/min (ref >60)
Glucose, Bld: 88 mg/dL (ref 70–99)
Potassium: 4 mmol/L (ref 3.5–5.1)
Sodium: 144 mmol/L (ref 135–145)
Total Protein: 7.9 g/dL (ref 6.5–8.1)

## 2017-11-23 LAB — CBC
HCT: 35.4 % — ABNORMAL LOW (ref 36.0–46.0)
Hemoglobin: 11.7 g/dL — ABNORMAL LOW (ref 12.0–15.0)
MCH: 25.9 pg — AB (ref 26.0–34.0)
MCHC: 33.1 g/dL (ref 30.0–36.0)
MCV: 78.5 fL (ref 78.0–100.0)
PLATELETS: 271 10*3/uL (ref 150–400)
RBC: 4.51 MIL/uL (ref 3.87–5.11)
RDW: 14.6 % (ref 11.5–15.5)
WBC: 10.6 10*3/uL — AB (ref 4.0–10.5)

## 2017-11-23 LAB — I-STAT BETA HCG BLOOD, ED (MC, WL, AP ONLY): I-stat hCG, quantitative: <5 m[IU]/mL (ref <5)

## 2017-11-23 NOTE — ED Triage Notes (Signed)
Pt complains of bilateral foot swelling since this morning. Pt denies pain or injury.

## 2017-11-23 NOTE — Discharge Instructions (Signed)
Elevate your feet as often as possible. Eat less salt. Wear the compression stockings for comfort and to help with the swelling. Follow up with your doctor. If you do not have a doctor call the [facility] and Wellness to make an appointment.

## 2017-11-23 NOTE — ED Notes (Signed)
Bed: WTR7 Expected date:  Expected time:  Means of arrival:  Comments: 

## 2017-11-23 NOTE — ED Provider Notes (Signed)
[facility]-EMERGENCY DEPT Provider Note   CSN: [id number] Arrival date & time: 11/23/17  1028     History   Chief Complaint Chief Complaint  Patient presents with  . Foot Swelling    HPI [place] Novicki is a 22 y.o. female who presents to the ED with bilateral foot swelling. Patient reports that the swelling started yesterday. Patient s/p SVD without problems 09/02/17. Patient reports no blood pressure or lower extremity problems during pregnancy. Patient denies pain just swelling.   HPI  Past Medical History:  Diagnosis Date  . Obesity   . UTI (urinary tract infection)     Patient Active Problem List   Diagnosis Date Noted  . Post-dates pregnancy 09/02/2017  . Spontaneous vaginal delivery 09/02/2017  . Unwanted fertility 08/15/2017  . Supervision of normal pregnancy 04/28/2017  . GBS bacteriuria 04/18/2017  . Gallbladder stone without cholecystitis or obstruction 02/09/2015  . Sickle cell trait (HCC) 01/10/2015    Past Surgical History:  Procedure Laterality Date  . EXTERNAL EAR SURGERY Bilateral 2017   keloid removal  . keloid     removal on ear     OB History    Gravida  4   Para  3   Term  3   Preterm  0   AB  1   Living  3     SAB  1   TAB  0   Ectopic  0   Multiple  0   Live Births  3        Obstetric Comments  03/12/14 SAB 12 wks         Home Medications    Prior to Admission medications   Medication Sig Start Date End Date Taking? Authorizing Provider  acetaminophen (TYLENOL) 500 MG tablet Take 500 mg by mouth every 6 (six) hours as needed for mild pain.    [provider]  ibuprofen (ADVIL,MOTRIN) 600 MG tablet Take 1 tablet (600 mg total) by mouth every 6 (six) hours. 09/04/17   Hipkins, Verdene Lennert, MD  nystatin (MYCOSTATIN/NYSTOP) powder Apply topically 3 (three) times daily. 10/11/17   Maczis, Elmer Sow, PA-C  Prenatal MV-Min-FA-Omega-3 (PRENATAL GUMMIES/DHA & FA PO) Take 1 tablet by mouth  daily.     [provider]    Family History Family History  Problem Relation Age of Onset  . Cancer Paternal Grandfather   . Diabetes Father   . Hypertension Father     Social History Social History   Tobacco Use  . Smoking status: Never Smoker  . Smokeless tobacco: Never Used  Substance Use Topics  . Alcohol use: No  . Drug use: No     Allergies   Patient has no known allergies.   Review of Systems Review of Systems  Skin:       Swelling bilateral feet.  All other systems reviewed and are negative.    Physical Exam Updated Vital Signs BP 104/63 (BP Location: Left Arm)   Pulse 86   Temp 97.8 F (36.6 C) (Oral)   Resp 18   LMP 11/02/2017   SpO2 100%   Physical Exam  Constitutional: She appears well-developed and well-nourished. No distress.  HENT:  Head: Normocephalic.  Eyes: EOM are normal.  Neck: Normal range of motion. Neck supple.  Cardiovascular: Normal rate and regular rhythm.  Pulmonary/Chest: Effort normal and breath sounds normal.  Abdominal: Soft. There is no tenderness.  Musculoskeletal: Normal range of motion.  Bilateral lower legs with mild swelling,  no pitting edema. Patient able to ambulate without difficulty or pain.   Neurological: She is alert.  Skin: Skin is warm and dry.  Psychiatric: She has a normal mood and affect. Her behavior is normal.  Nursing note and vitals reviewed.    ED Treatments / Results  Labs (all labs ordered are listed, but only abnormal results are displayed) Labs Reviewed  URINALYSIS, ROUTINE W REFLEX MICROSCOPIC - Abnormal; Notable for the following components:      Result Value   APPearance HAZY (*)    Nitrite POSITIVE (*)    Leukocytes, UA LARGE (*)    WBC, UA >50 (*)    Bacteria, UA MANY (*)    All other components within normal limits  CBC - Abnormal; Notable for the following components:   WBC 10.6 (*)    Hemoglobin 11.7 (*)    HCT 35.4 (*)    MCH 25.9 (*)    All other components  within normal limits  URINE CULTURE  BRAIN NATRIURETIC PEPTIDE  COMPREHENSIVE METABOLIC PANEL  I-STAT BETA HCG BLOOD, ED ([facility], [facility], [facility] ONLY)   Radiology No results found.  Procedures Procedures (including critical care time)  Medications Ordered in ED Medications - No data to display   Initial Impression / Assessment and Plan / ED Course  I have reviewed the triage vital signs and the nursing notes. 22 y.o. female here with swelling of her feet stable for d/c with normal labs and no shortness of breath, normal BP. Possible UTI but since patient denies symptoms have sent urine for culture and will call if antibiotics are indicated. Discussed with the patient sodium intake and elevation of lower extremities. F/u with PCP.   Final Clinical Impressions(s) / ED Diagnoses   Final diagnoses:  Foot swelling    ED Discharge Orders    None       Kerrie [place], [place] 11/23/17 1531    Raeford Razor, MD 11/24/17 1026

## 2017-11-25 LAB — URINE CULTURE: Special Requests: NORMAL

## 2017-11-26 ENCOUNTER — Telehealth (HOSPITAL_COMMUNITY): Payer: Self-pay | Admitting: Pharmacist

## 2017-11-26 ENCOUNTER — Telehealth: Payer: Self-pay

## 2017-11-26 NOTE — Telephone Encounter (Signed)
Post ED Visit - Positive Culture Follow-up: Unsuccessful Patient Follow-up  Culture assessed and recommendations reviewed by:  []  Enzo Bi, Pharm.D. []  Celedonio Miyamoto, Pharm.D., BCPS AQ-ID []  Garvin Fila, Pharm.D., BCPS []  Georgina Pillion, Pharm.D., BCPS []  [place], [address].D., BCPS, AAHIVP []  Estella Husk, Pharm.D., BCPS, AAHIVP []  Sherlynn Carbon, PharmD []  Pollyann Samples, PharmD, BCPS  [facility] Pharm D Positive urine culture Symptom check may need abx []  Patient discharged without antimicrobial prescription and treatment is now indicated []  Organism is resistant to prescribed ED discharge antimicrobial []  Patient with positive blood cultures   Unable to contact patient after 3 attempts, letter will be sent to address on file  Jerry Caras 11/26/2017, 3:08 PM

## 2017-11-26 NOTE — Progress Notes (Signed)
ED Antimicrobial Stewardship Positive Culture Follow Up   Beverly Anthony is an 22 y.o. female who presented to [facility]  Recent Results (from the past 720 hour(s))  Urine culture     Status: Abnormal   Collection Time: 11/23/17  1:23 PM  Result Value Ref Range Status   Specimen Description   Final    URINE, CLEAN CATCH Performed at [facility], [address], [place], [place] [address]    Special Requests   Final    Normal Performed at [facility], [address] [place], [place] [address]    Culture >=100,000 COLONIES/mL ESCHERICHIA COLI (A)  Final   Report Status 11/25/2017 FINAL  Final   Organism ID, Bacteria ESCHERICHIA COLI (A)  Final      Susceptibility   Escherichia coli - MIC*    AMPICILLIN <=2 SENSITIVE Sensitive     CEFAZOLIN <=4 SENSITIVE Sensitive     CEFTRIAXONE <=1 SENSITIVE Sensitive     CIPROFLOXACIN >=4 RESISTANT Resistant     GENTAMICIN <=1 SENSITIVE Sensitive     IMIPENEM <=0.25 SENSITIVE Sensitive     NITROFURANTOIN <=16 SENSITIVE Sensitive     TRIMETH/SULFA <=20 SENSITIVE Sensitive     AMPICILLIN/SULBACTAM <=2 SENSITIVE Sensitive     PIP/TAZO <=4 SENSITIVE Sensitive     Extended ESBL NEGATIVE Sensitive     * >=100,000 COLONIES/mL ESCHERICHIA COLI    []  Treated with x organism resistant to prescribed antimicrobial [x]  Patient discharged originally without antimicrobial agent and treatment is now indicated  Please check patient for urinary symptoms. If she is symptomatic, please prescribe the following RX:  New antibiotic prescription: Macrobid 100 mg po bid x 5 days  ED Provider: Marigene Ehlers, PA-C   Beverly Anthony, Darl Householder 11/26/2017, 3:07 PM

## 2018-01-22 ENCOUNTER — Telehealth: Payer: Self-pay | Admitting: Emergency Medicine

## 2018-01-22 NOTE — Telephone Encounter (Signed)
Lost to followup 

## 2018-07-20 ENCOUNTER — Ambulatory Visit: Payer: Self-pay

## 2018-07-20 NOTE — Telephone Encounter (Signed)
Pt. Reports she had an abortion 2-3 months ago and wants "to get my normal pH back." Has  Normal menstrual cycle since the procedure.Has noticed an odor but know discharge. Will stay hydrated and try yogurt in her diet.

## 2018-10-27 ENCOUNTER — Encounter (HOSPITAL_BASED_OUTPATIENT_CLINIC_OR_DEPARTMENT_OTHER): Payer: Self-pay | Admitting: Emergency Medicine

## 2018-10-27 ENCOUNTER — Other Ambulatory Visit: Payer: Self-pay

## 2018-10-27 ENCOUNTER — Emergency Department (HOSPITAL_BASED_OUTPATIENT_CLINIC_OR_DEPARTMENT_OTHER)
Admission: EM | Admit: 2018-10-27 | Discharge: 2018-10-27 | Disposition: A | Discharge status: Home / Self Care | Payer: Medicaid Other | Attending: Emergency Medicine | Admitting: Emergency Medicine

## 2018-10-27 DIAGNOSIS — R1084 Generalized abdominal pain: Secondary | ICD-10-CM | POA: Diagnosis not present

## 2018-10-27 DIAGNOSIS — R112 Nausea with vomiting, unspecified: Secondary | ICD-10-CM | POA: Diagnosis present

## 2018-10-27 LAB — URINALYSIS, ROUTINE W REFLEX MICROSCOPIC
Bilirubin Urine: NEGATIVE
Glucose, UA: NEGATIVE mg/dL
Hgb urine dipstick: NEGATIVE
Ketones, ur: NEGATIVE mg/dL
Leukocytes,Ua: NEGATIVE
Nitrite: NEGATIVE
Protein, ur: NEGATIVE mg/dL
Specific Gravity, Urine: 1.02 (ref 1.005–1.030)
pH: 6 (ref 5.0–8.0)

## 2018-10-27 LAB — COMPREHENSIVE METABOLIC PANEL
ALT: 13 U/L (ref 0–44)
AST: 16 U/L (ref 15–41)
Albumin: 3.4 g/dL — ABNORMAL LOW (ref 3.5–5.0)
Alkaline Phosphatase: 104 U/L (ref 38–126)
Anion gap: 12 (ref 5–15)
BUN: 9 mg/dL (ref 6–20)
CO2: 22 mmol/L (ref 22–32)
Calcium: 9.2 mg/dL (ref 8.9–10.3)
Chloride: 104 mmol/L (ref 98–111)
Creatinine, Ser: 0.88 mg/dL (ref 0.44–1.00)
GFR calc Af Amer: >60 mL/min (ref >60)
GFR calc non Af Amer: >60 mL/min (ref >60)
Glucose, Bld: 94 mg/dL (ref 70–99)
Potassium: 3.7 mmol/L (ref 3.5–5.1)
Sodium: 138 mmol/L (ref 135–145)
Total Bilirubin: 0.4 mg/dL (ref 0.3–1.2)
Total Protein: 7.5 g/dL (ref 6.5–8.1)

## 2018-10-27 LAB — CBC WITH DIFFERENTIAL/PLATELET
Abs Immature Granulocytes: 0.02 10*3/uL (ref 0.00–0.07)
Basophils Absolute: 0 10*3/uL (ref 0.0–0.1)
Basophils Relative: 0 %
Eosinophils Absolute: 0.3 10*3/uL (ref 0.0–0.5)
Eosinophils Relative: 2 %
HCT: 35.7 % — ABNORMAL LOW (ref 36.0–46.0)
Hemoglobin: 11.7 g/dL — ABNORMAL LOW (ref 12.0–15.0)
Immature Granulocytes: 0 %
Lymphocytes Relative: 27 %
Lymphs Abs: 3.2 10*3/uL (ref 0.7–4.0)
MCH: 26.4 pg (ref 26.0–34.0)
MCHC: 32.8 g/dL (ref 30.0–36.0)
MCV: 80.4 fL (ref 80.0–100.0)
Monocytes Absolute: 0.5 10*3/uL (ref 0.1–1.0)
Monocytes Relative: 4 %
Neutro Abs: 8.1 10*3/uL — ABNORMAL HIGH (ref 1.7–7.7)
Neutrophils Relative %: 67 %
Platelets: 260 10*3/uL (ref 150–400)
RBC: 4.44 MIL/uL (ref 3.87–5.11)
RDW: 13.2 % (ref 11.5–15.5)
WBC: 12.2 10*3/uL — ABNORMAL HIGH (ref 4.0–10.5)
nRBC: 0 % (ref 0.0–0.2)

## 2018-10-27 LAB — HCG, SERUM, QUALITATIVE: Preg, Serum: NEGATIVE

## 2018-10-27 LAB — PREGNANCY, URINE: Preg Test, Ur: NEGATIVE

## 2018-10-27 MED ORDER — ONDANSETRON 8 MG PO TBDP
8.0000 mg | ORAL_TABLET | Freq: Once | ORAL | Status: AC
  Administered 2018-10-27: 8 mg via ORAL
  Filled 2018-10-27: qty 1

## 2018-10-27 MED ORDER — ONDANSETRON 8 MG PO TBDP: 8.0000 mg | ORAL_TABLET | Freq: Three times a day (TID) | ORAL | 0 refills | Status: DC | PRN

## 2018-10-27 MED ORDER — NAPROXEN 375 MG PO TABS: 375.0000 mg | ORAL_TABLET | Freq: Two times a day (BID) | ORAL | 0 refills | Status: DC

## 2018-10-27 NOTE — ED Triage Notes (Signed)
Pt states that she thinks she may be pregnant. Was requesting test.

## 2018-10-27 NOTE — ED Triage Notes (Signed)
Pt states that she has been vomiting in the mornings, eating more, sleeping more and having lower abdominal pain. Denies urinary symptoms or vaginal discharge.

## 2018-10-27 NOTE — ED Notes (Signed)
ED Provider at bedside. 

## 2018-10-27 NOTE — Discharge Instructions (Signed)
We saw you in the ER for abdominal pain. All the results in the ER are normal, labs and imaging. We are not sure what is causing your symptoms. The workup in the ER is not complete, and is limited to screening for life threatening and emergent conditions only, so please see a primary care doctor for further evaluation.  Please return to the ER if your symptoms worsen; you have increased pain, fevers, chills, inability to keep any medications down, confusion. Otherwise see the outpatient doctor as requested.   

## 2018-10-27 NOTE — ED Notes (Signed)
Pt ambulated to restroom to attempt and get a urine sample

## 2018-10-28 NOTE — ED Provider Notes (Signed)
[facility] EMERGENCY DEPARTMENT Provider Note   CSN: [id number] Arrival date & time: 10/27/18  0457     History   Chief Complaint Chief Complaint  Patient presents with  . Emesis  . Abdominal Pain    HPI [name] is a 23 y.o. female.     HPI  23 year old female comes in a chief complaint of abdominal pain and vomiting. Patient is morbidly obese.  She reports that she has been having nausea and vomiting for the last couple of days.  Patient has associated abdominal discomfort that is located in the lower quadrants.  She is denying any vaginal discharge, bleeding, UTI-like symptoms.  She is in a monogamous relationship with her boyfriend and does not want to be tested for STDs.  LMP was end of last month.  OB history is G5, P3.  Past Medical History:  Diagnosis Date  . Obesity   . UTI (urinary tract infection)     Patient Active Problem List   Diagnosis Date Noted  . Post-dates pregnancy 09/02/2017  . Spontaneous vaginal delivery 09/02/2017  . Unwanted fertility 08/15/2017  . Supervision of normal pregnancy 04/28/2017  . GBS bacteriuria 04/18/2017  . Gallbladder stone without cholecystitis or obstruction 02/09/2015  . Sickle cell trait (HCC) 01/10/2015    Past Surgical History:  Procedure Laterality Date  . EXTERNAL EAR SURGERY Bilateral 2017   keloid removal  . keloid     removal on ear     OB History    Gravida  4   Para  3   Term  3   Preterm  0   AB  1   Living  3     SAB  1   TAB  0   Ectopic  0   Multiple  0   Live Births  3        Obstetric Comments  03/12/14 SAB 12 wks         Home Medications    Prior to Admission medications   Medication Sig Start Date End Date Taking? Authorizing Provider  acetaminophen (TYLENOL) 500 MG tablet Take 500 mg by mouth every 6 (six) hours as needed for mild pain.    [provider]  ibuprofen (ADVIL,MOTRIN) 600 MG tablet Take 1 tablet (600 mg total) by mouth every 6  (six) hours. 09/04/17   Hipkins, Verdene LennertBrittany P, MD  naproxen (NAPROSYN) 375 MG tablet Take 1 tablet (375 mg total) by mouth 2 (two) times daily. 10/27/18   Derwood KaplanNanavati, Shiniqua Groseclose, MD  nystatin (MYCOSTATIN/NYSTOP) powder Apply topically 3 (three) times daily. 10/11/17   Maczis, Elmer SowMichael M, PA-C  ondansetron (ZOFRAN ODT) 8 MG disintegrating tablet Take 1 tablet (8 mg total) by mouth every 8 (eight) hours as needed for nausea. 10/27/18   Derwood KaplanNanavati, Pegge Cumberledge, MD  Prenatal MV-Min-FA-Omega-3 (PRENATAL GUMMIES/DHA & FA PO) Take 1 tablet by mouth daily.     [provider]    Family History Family History  Problem Relation Age of Onset  . Cancer Paternal Grandfather   . Diabetes Father   . Hypertension Father     Social History Social History   Tobacco Use  . Smoking status: Never Smoker  . Smokeless tobacco: Never Used  Substance Use Topics  . Alcohol use: No  . Drug use: No     Allergies   Patient has no known allergies.   Review of Systems Review of Systems  Constitutional: Positive for activity change.  Gastrointestinal: Positive for abdominal pain, nausea  and vomiting. Negative for constipation and diarrhea.  All other systems reviewed and are negative.    Physical Exam Updated Vital Signs BP 112/70   Pulse 81   Temp 98.6 F (37 C) (Oral)   Resp 18   Ht 5\' 2"  (1.575 m)   Wt 113.4 kg   LMP 10/11/2018   SpO2 100%   BMI 45.73 kg/m   Physical Exam Vitals signs and nursing note reviewed.  Constitutional:      Appearance: She is well-developed.  HENT:     Head: Normocephalic and atraumatic.  Neck:     Musculoskeletal: Normal range of motion and neck supple.  Cardiovascular:     Rate and Rhythm: Normal rate.  Pulmonary:     Effort: Pulmonary effort is normal.  Abdominal:     General: Bowel sounds are normal.     Tenderness: There is abdominal tenderness in the right lower quadrant, suprapubic area and left lower quadrant. There is no guarding or rebound.  Skin:     General: Skin is warm and dry.  Neurological:     Mental Status: She is alert and oriented to person, place, and time.      ED Treatments / Results  Labs (all labs ordered are listed, but only abnormal results are displayed) Labs Reviewed  COMPREHENSIVE METABOLIC PANEL - Abnormal; Notable for the following components:      Result Value   Albumin 3.4 (*)    All other components within normal limits  CBC WITH DIFFERENTIAL/PLATELET - Abnormal; Notable for the following components:   WBC 12.2 (*)    Hemoglobin 11.7 (*)    HCT 35.7 (*)    Neutro Abs 8.1 (*)    All other components within normal limits  PREGNANCY, URINE  URINALYSIS, ROUTINE W REFLEX MICROSCOPIC  HCG, SERUM, QUALITATIVE    EKG None  Radiology No results found.  Procedures Procedures (including critical care time)  Medications Ordered in ED Medications  ondansetron (ZOFRAN-ODT) disintegrating tablet 8 mg (8 mg Oral Given 10/27/18 0631)     Initial Impression / Assessment and Plan / ED Course  I have reviewed the triage vital signs and the nursing notes.  Pertinent labs & imaging results that were available during my care of the patient were reviewed by me and considered in my medical decision making (see chart for details).        23 year old comes in a chief complaint of abdominal pain, nausea, vomiting.  She denies any back pain, UTI-like symptoms, vaginal discharge or bleeding.  No recent STDs and currently not at high risk for STDs.  Patient does not want a pelvic exam.  She is concerned that she could be pregnant because she is having nausea with vomiting and she feels like her breasts are fuller.  Her abdominal exam reveals mild tenderness in the lower quadrants without any peritoneal findings or focal area of tenderness.  Labs ordered and they are all reassuring. She denies any UTI-like symptoms, UA is normal and pregnancy test is also negative.   Final Clinical Impressions(s) / ED Diagnoses    Final diagnoses:  Non-intractable vomiting with nausea, unspecified vomiting type  Generalized abdominal pain    ED Discharge Orders         Ordered    ondansetron (ZOFRAN ODT) 8 MG disintegrating tablet  Every 8 hours PRN     10/27/18 0715    naproxen (NAPROSYN) 375 MG tablet  2 times daily     10/27/18 0715  Derwood KaplanNanavati, Kameelah Minish, MD 10/28/18 0010

## 2018-11-03 ENCOUNTER — Other Ambulatory Visit: Payer: Self-pay

## 2018-11-03 ENCOUNTER — Inpatient Hospital Stay (HOSPITAL_COMMUNITY)
Admission: AD | Admit: 2018-11-03 | Discharge: 2018-11-03 | Disposition: A | Discharge status: Home / Self Care | Payer: Medicaid Other | Attending: Obstetrics and Gynecology | Admitting: Obstetrics and Gynecology

## 2018-11-03 DIAGNOSIS — R110 Nausea: Secondary | ICD-10-CM | POA: Diagnosis not present

## 2018-11-03 DIAGNOSIS — R109 Unspecified abdominal pain: Secondary | ICD-10-CM | POA: Diagnosis not present

## 2018-11-03 DIAGNOSIS — Z3202 Encounter for pregnancy test, result negative: Secondary | ICD-10-CM | POA: Diagnosis not present

## 2018-11-03 LAB — POCT PREGNANCY, URINE: Preg Test, Ur: NEGATIVE

## 2018-11-03 LAB — HCG, SERUM, QUALITATIVE: Preg, Serum: NEGATIVE

## 2018-11-03 NOTE — MAU Note (Signed)
Urine sent to lab 

## 2018-11-03 NOTE — MAU Provider Note (Signed)
First Provider Initiated Contact with Patient 11/03/18 1012      S Ms. Beverly Anthony is a 23 y.o. [phone] non-pregnant female who presents to MAU today with complaint of abdominal cramping & nausea. Was seen in the ED for same symptoms a few weeks ago. Symptoms have not changed. No fever. Reports a positive UPT yesterday. States it was "very faint" & she read it after 5+ minutes. LMP was 7/23.   O BP 118/76 (BP Location: Right Arm)   Pulse 87   Temp 98.6 F (37 C)   Resp 18   LMP 10/11/2018   SpO2 100%  Physical Exam  Nursing note and vitals reviewed. Constitutional: She appears well-developed and well-nourished. No distress.  Respiratory: Effort normal. No respiratory distress.  Skin: She is not diaphoretic.  Psychiatric: She has a normal mood and affect. Her behavior is normal. Judgment and thought content normal.    A Non pregnant female Medical screening exam complete 1. Negative pregnancy test      P Discharge from MAU in stable condition Patient given the option of transfer to [facility] for further evaluation or seek care in outpatient facility of choice List of options for follow-up given  Warning signs for worsening condition that would warrant emergency follow-up discussed Patient may return to MAU as needed for pregnancy related complaints  Jorje Guild, NP 11/03/2018 12:21 PM

## 2018-11-03 NOTE — Discharge Instructions (Signed)
Home Pregnancy Test Information ° °A home pregnancy test helps you determine whether you are pregnant or not. There are several types of home pregnancy tests that can be bought at a grocery store or pharmacy. °What is being tested? °A home pregnancy test detects the presence of a hormone in your urine. The hormone is produced by cells of the placenta (human chorionic gonadotropin, or hCG). The placenta is the organ that forms to nourish and support a developing baby. °How are pregnancy tests done? °Home pregnancy tests require a urine sample. °· Most kits use a plastic testing device with a strip of paper that indicates whether there is hCG in your urine. °· Follow the test package instructions very carefully for how to test your urine. Depending on the test, you may need to: °? Urinate directly onto the stick. °? Urinate into a cup. °· Wait for the results as directed by the package instructions. The amount of time may be different for each type of test. °· Follow the test package instructions for how to read your test results. Depending on the test, results may be displayed as: °? A plus or a minus sign. °? One or two lines. °? "Pregnant" or "not pregnant." °· For best results, use your first urine of the morning. That is when the concentration of hCG is highest. °How accurate are home pregnancy tests? °Home pregnancy tests are very accurate when: °· You are at least 3-[redacted] weeks pregnant. °· It has been 1-2 weeks since your missed period. °· You use the test according to the package instructions. °What can interfere with home pregnancy test results? °Sometimes, a home pregnancy test may report that you are pregnant when you are not pregnant (false-positive result). This can happen if you: °· Are taking certain medicines, such as: °? Medicine to control seizures. °? Anti-anxiety medicine. °? Fertility medicine with hCG. °· Have a medical condition that affects your hormone levels. °· Had a recent pregnancy loss  (miscarriage) or abortion. °Sometimes, a home pregnancy test may report that you are not pregnant when you are pregnant (false-negative result). This can happen if you: °· Took the test too early in your pregnancy. Before 3-4 weeks of pregnancy, there may not be enough hCG to detect. °· Drank a lot of liquid before the test. °· Used an expired pregnancy test. °· Are taking certain medicines, such as antihistamines or water pills (diuretics). °What should I do if I have a positive pregnancy test? °If you have a positive home pregnancy test, schedule an appointment with your health care provider. You might need additional testing to confirm the pregnancy. °What should I do if I have a negative pregnancy test? °If you have a negative home pregnancy test but still have symptoms of pregnancy, contact your health care provider. Your health care provider will test a sample of your blood to check for pregnancy. In some cases, a blood test will return a positive result even if a urine test was negative because blood tests are more sensitive. This means blood tests can detect hCG earlier than home pregnancy tests. °Follow these instructions at home: °If you are pregnant, planning to become pregnant, or think you may be pregnant: °· Do not drink alcohol. °· Do not use street drugs. °· Do not use any products that contain nicotine or tobacco, such as cigarettes and e-cigarettes. If you need help quitting, ask your health care provider. °· Take a prenatal vitamin that contains at least 400 mcg of folic   acid daily. °Summary °· A home pregnancy test helps you determine whether you are pregnant or not by detecting the presence of the hormone human chorionic gonadotropin (hCG) in a sample of your urine. °· Follow the test package instructions very carefully. For best results, use your first urine of the morning. That is when the concentration of hCG is highest. °· Home pregnancy tests are very accurate when you are 3-[redacted] weeks  pregnant or when it has been 1-2 weeks since your missed period. °· A home pregnancy test may report that you are pregnant when you are not pregnant or that you are not pregnant when you are pregnant. °· Contact your health care provider to confirm your results. Your health care provider will test a sample of your blood to check for pregnancy. °This information is not intended to replace advice given to you by your health care provider. Make sure you discuss any questions you have with your health care provider. °Document Released: 03/10/2003 Document Revised: 06/28/2018 Document Reviewed: 03/20/2017 °Elsevier Patient Education © 2020 [employer] ° °

## 2018-11-03 NOTE — MAU Note (Signed)
Beverly Anthony is a 23 y.o. at Unknown here in MAU reporting: nausea for the past week and had some abdominal pain last night but none today. + UPT yesterday at home, states it was a faint line. Had 1 episode of emesis today. No bleeding.   LMP: 10/11/18  Onset of complaint: ongoing  Pain score: 0/10  Vitals:   11/03/18 1009  BP: 118/76  Pulse: 87  Resp: 18  Temp: 98.6 F (37 C)  SpO2: 100%      Lab orders placed from triage: UPT

## 2019-06-18 ENCOUNTER — Inpatient Hospital Stay (HOSPITAL_COMMUNITY)
Admission: AD | Admit: 2019-06-18 | Discharge: 2019-06-19 | Disposition: A | Discharge status: Home / Self Care | Payer: Medicaid Other | Attending: Family Medicine | Admitting: Family Medicine

## 2019-06-18 ENCOUNTER — Other Ambulatory Visit: Payer: Self-pay

## 2019-06-18 DIAGNOSIS — O99212 Obesity complicating pregnancy, second trimester: Secondary | ICD-10-CM | POA: Diagnosis not present

## 2019-06-18 DIAGNOSIS — O26891 Other specified pregnancy related conditions, first trimester: Secondary | ICD-10-CM | POA: Insufficient documentation

## 2019-06-18 DIAGNOSIS — O3680X0 Pregnancy with inconclusive fetal viability, not applicable or unspecified: Secondary | ICD-10-CM | POA: Diagnosis not present

## 2019-06-18 DIAGNOSIS — O26899 Other specified pregnancy related conditions, unspecified trimester: Secondary | ICD-10-CM

## 2019-06-18 DIAGNOSIS — Z3A01 Less than 8 weeks gestation of pregnancy: Secondary | ICD-10-CM | POA: Insufficient documentation

## 2019-06-18 DIAGNOSIS — R102 Pelvic and perineal pain: Secondary | ICD-10-CM | POA: Diagnosis present

## 2019-06-18 DIAGNOSIS — E669 Obesity, unspecified: Secondary | ICD-10-CM | POA: Insufficient documentation

## 2019-06-18 NOTE — MAU Note (Signed)
PT SAYS 3 DAYS  SHE TOOK PREG TEST - POSITIVE. NO BC.  ABD PAIN STARTED - LOWER-  4.  NO VAG BLEEDING. LAST SEX-  SUNDAY

## 2019-06-19 ENCOUNTER — Encounter (HOSPITAL_COMMUNITY): Payer: Self-pay | Admitting: Family Medicine

## 2019-06-19 ENCOUNTER — Inpatient Hospital Stay (HOSPITAL_COMMUNITY): Payer: Medicaid Other

## 2019-06-19 DIAGNOSIS — R102 Pelvic and perineal pain: Secondary | ICD-10-CM

## 2019-06-19 DIAGNOSIS — O3680X0 Pregnancy with inconclusive fetal viability, not applicable or unspecified: Secondary | ICD-10-CM

## 2019-06-19 DIAGNOSIS — Z3A01 Less than 8 weeks gestation of pregnancy: Secondary | ICD-10-CM

## 2019-06-19 DIAGNOSIS — O26891 Other specified pregnancy related conditions, first trimester: Secondary | ICD-10-CM

## 2019-06-19 LAB — CBC
HCT: 34.8 % — ABNORMAL LOW (ref 36.0–46.0)
Hemoglobin: 11.7 g/dL — ABNORMAL LOW (ref 12.0–15.0)
MCH: 26.8 pg (ref 26.0–34.0)
MCHC: 33.6 g/dL (ref 30.0–36.0)
MCV: 79.8 fL — ABNORMAL LOW (ref 80.0–100.0)
Platelets: 261 10*3/uL (ref 150–400)
RBC: 4.36 MIL/uL (ref 3.87–5.11)
RDW: 13.7 % (ref 11.5–15.5)
WBC: 12.4 10*3/uL — ABNORMAL HIGH (ref 4.0–10.5)
nRBC: 0 % (ref 0.0–0.2)

## 2019-06-19 LAB — WET PREP, GENITAL
Sperm: NONE SEEN
Trich, Wet Prep: NONE SEEN
Yeast Wet Prep HPF POC: NONE SEEN

## 2019-06-19 LAB — HCG, QUANTITATIVE, PREGNANCY: hCG, Beta Chain, Quant, S: 53 m[IU]/mL — ABNORMAL HIGH (ref <5)

## 2019-06-19 LAB — HIV ANTIBODY (ROUTINE TESTING W REFLEX): HIV Screen 4th Generation wRfx: NONREACTIVE

## 2019-06-19 NOTE — Discharge Instructions (Signed)

## 2019-06-19 NOTE — MAU Provider Note (Signed)
Chief Complaint: Abdominal Pain   First Provider Initiated Contact with Patient 06/19/19 0000        SUBJECTIVE HPI: Beverly Anthony is a 24 y.o. [phone] at Unknown by LMP who presents to maternity admissions reporting positive pregnancy test with pelvic pain since yesterday. No bleeding. She denies vaginal bleeding, vaginal itching/burning, urinary symptoms, h/a, dizziness, n/v, or fever/chills.    Abdominal Pain This is a new problem. The current episode started yesterday. The onset quality is gradual. The problem occurs intermittently. The problem has been unchanged. The pain is located in the suprapubic region. The quality of the pain is cramping. The abdominal pain does not radiate. Pertinent negatives include no constipation, diarrhea, dysuria, fever, frequency, headaches, myalgias, nausea or vomiting. Nothing aggravates the pain. The pain is relieved by nothing. She has tried nothing for the symptoms.   RN Note: PT SAYS 3 DAYS  SHE TOOK PREG TEST - POSITIVE. NO BC.  ABD PAIN STARTED - LOWER-  4.  NO VAG BLEEDING. LAST SEX-  SUNDAY  Past Medical History:  Diagnosis Date  . Obesity   . UTI (urinary tract infection)    Past Surgical History:  Procedure Laterality Date  . EXTERNAL EAR SURGERY Bilateral 2017   keloid removal  . keloid     removal on ear   Social History   Socioeconomic History  . Marital status: Legally Separated    Spouse name: Not on file  . Number of children: Not on file  . Years of education: Not on file  . Highest education level: Not on file  Occupational History  . Not on file  Tobacco Use  . Smoking status: Never Smoker  . Smokeless tobacco: Never Used  Substance and Sexual Activity  . Alcohol use: No  . Drug use: No  . Sexual activity: Yes  Other Topics Concern  . Not on file  Social History Narrative  . Not on file   Social Determinants of Health   Financial Resource Strain:   . Difficulty of Paying Living Expenses:   Food Insecurity:    . Worried About Programme researcher, broadcasting/film/video in the Last Year:   . Barista in the Last Year:   Transportation Needs:   . Freight forwarder (Medical):   Marland Kitchen Lack of Transportation (Non-Medical):   Physical Activity:   . Days of Exercise per Week:   . Minutes of Exercise per Session:   Stress:   . Feeling of Stress :   Social Connections:   . Frequency of Communication with Friends and Family:   . Frequency of Social Gatherings with Friends and Family:   . Attends Religious Services:   . Active Member of Clubs or Organizations:   . Attends Banker Meetings:   Marland Kitchen Marital Status:   Intimate Partner Violence:   . Fear of Current or Ex-Partner:   . Emotionally Abused:   Marland Kitchen Physically Abused:   . Sexually Abused:    No current facility-administered medications on file prior to encounter.   Current Outpatient Medications on File Prior to Encounter  Medication Sig Dispense Refill  . acetaminophen (TYLENOL) 500 MG tablet Take 500 mg by mouth every 6 (six) hours as needed for mild pain.    Marland Kitchen ibuprofen (ADVIL,MOTRIN) 600 MG tablet Take 1 tablet (600 mg total) by mouth every 6 (six) hours. 30 tablet 0  . naproxen (NAPROSYN) 375 MG tablet Take 1 tablet (375 mg total) by mouth 2 (two) times daily.  20 tablet 0  . ondansetron (ZOFRAN ODT) 8 MG disintegrating tablet Take 1 tablet (8 mg total) by mouth every 8 (eight) hours as needed for nausea. 20 tablet 0  . Prenatal MV-Min-FA-Omega-3 (PRENATAL GUMMIES/DHA & FA PO) Take 1 tablet by mouth daily.      No Known Allergies  I have reviewed patient's Past Medical Hx, Surgical Hx, Family Hx, Social Hx, medications and allergies.   ROS:  Review of Systems  Constitutional: Negative for fever.  Gastrointestinal: Positive for abdominal pain. Negative for constipation, diarrhea, nausea and vomiting.  Genitourinary: Negative for dysuria and frequency.  Musculoskeletal: Negative for myalgias.  Neurological: Negative for headaches.    Review of Systems  Other systems negative   Physical Exam  Physical Exam Patient Vitals for the past 24 hrs:  BP Temp Pulse Resp Height Weight  06/18/19 2345 119/71 98.9 F (37.2 C) 85 18 5\' 2"  (1.575 m) 117.9 kg   Constitutional: Well-developed, well-nourished female in no acute distress.  Cardiovascular: normal rate Respiratory: normal effort GI: Abd soft, non-tender. Pos BS x 4 MS: Extremities nontender, no edema, normal ROM Neurologic: Alert and oriented x 4.  GU: Neg CVAT.  PELVIC EXAM: Cervix pink, visually closed, without lesion, scant white creamy discharge, vaginal walls and external genitalia normal   LAB RESULTS    Results for orders placed or performed during the hospital encounter of 06/18/19 (from the past 24 hour(s))  CBC     Status: Abnormal   Collection Time: 06/19/19 12:06 AM  Result Value Ref Range   WBC 12.4 (H) 4.0 - 10.5 K/uL   RBC 4.36 3.87 - 5.11 MIL/uL   Hemoglobin 11.7 (L) 12.0 - 15.0 g/dL   HCT 06/21/19 (L) 96.2 - 95.2 %   MCV 79.8 (L) 80.0 - 100.0 fL   MCH 26.8 26.0 - 34.0 pg   MCHC 33.6 30.0 - 36.0 g/dL   RDW 84.1 32.4 - 40.1 %   Platelets 261 150 - 400 K/uL   nRBC 0.0 0.0 - 0.2 %  hCG, quantitative, pregnancy     Status: Abnormal   Collection Time: 06/19/19 12:06 AM  Result Value Ref Range   hCG, Beta Chain, Quant, S 53 (H) <5 mIU/mL  Wet prep, genital     Status: Abnormal   Collection Time: 06/19/19  1:00 AM   Specimen: Vaginal  Result Value Ref Range   Yeast Wet Prep HPF POC NONE SEEN NONE SEEN   Trich, Wet Prep NONE SEEN NONE SEEN   Clue Cells Wet Prep HPF POC PRESENT (A) NONE SEEN   WBC, Wet Prep HPF POC MANY (A) NONE SEEN   Sperm NONE SEEN    IMAGING 06/21/19 OB LESS THAN 14 WEEKS WITH OB TRANSVAGINAL  Result Date: 06/19/2019 CLINICAL DATA:  Pregnant, beta HCG 53, pelvic/back pain x3 days EXAM: OBSTETRIC <14 WK 06/21/2019 AND TRANSVAGINAL OB US TECHNIQUE: Both transabdominal and transvaginal ultrasound examinations were performed for  complete evaluation of the gestation as well as the maternal uterus, adnexal regions, and pelvic cul-de-sac. Transvaginal technique was performed to assess early pregnancy. COMPARISON:  None. FINDINGS: Intrauterine gestational sac: None Maternal uterus/adnexae: Endometrial complex measures 10 mm. Bilateral ovaries are within normal limits, noting a small right corpus luteum. Small volume pelvic ascites. IMPRESSION: No IUP is visualized. This is not unexpected given the low beta HCG. By definition, in the setting of a positive pregnancy test, this reflects a pregnancy of unknown location. Differential considerations include early normal IUP, abnormal IUP/missed abortion,  or nonvisualized ectopic pregnancy. Serial beta HCG is suggested. Consider repeat pelvic ultrasound in 14 days, as clinically warranted. Electronically Signed   By: Julian Hy M.D.   On: 06/19/2019 01:58    MAU Management/MDM: Ordered usual first trimester r/o ectopic labs.   Pelvic exam and cultures done Will check baseline Ultrasound to rule out ectopic.  This bleeding/pain can represent a normal pregnancy with bleeding, spontaneous abortion or even an ectopic which can be life-threatening.  The process as listed above helps to determine which of these is present.  Reviewed results May represent an early pregnancy or an abnormal one Recommend repeat HCG on Friday  ASSESSMENT Pregnancy at [redacted]w[redacted]d per LMP Pelvic pain in pregnancy Pregnancy of unknown location  PLAN Discharge home Plan to repeat HCG level in 48 hours  Will repeat  Ultrasound in about 7-10 days if HCG levels double appropriately  Ectopic precautions  Pt stable at time of discharge. Encouraged to return here or to other Urgent Care/ED if she develops worsening of symptoms, increase in pain, fever, or other concerning symptoms.    Hansel Feinstein CNM, MSN Certified Nurse-Midwife 06/19/2019  12:00 AM

## 2019-06-20 ENCOUNTER — Telehealth: Payer: Self-pay | Admitting: Student

## 2019-06-20 DIAGNOSIS — A749 Chlamydial infection, unspecified: Secondary | ICD-10-CM

## 2019-06-20 LAB — GC/CHLAMYDIA PROBE AMP (~~LOC~~) NOT AT ARMC
Chlamydia: POSITIVE — AB
Comment: NEGATIVE
Comment: NORMAL
Neisseria Gonorrhea: NEGATIVE

## 2019-06-20 MED ORDER — AZITHROMYCIN 500 MG PO TABS: 1000.0000 mg | ORAL_TABLET | Freq: Once | ORAL | 0 refills | Status: AC

## 2019-06-20 NOTE — Telephone Encounter (Addendum)
[name] tested positive for  Chlamydia. Patient was called by RN and allergies and pharmacy confirmed. Rx sent to pharmacy of choice.   Judeth Horn, NP 06/20/2019 3:03 PM       ----- Message from Kathe Becton, RN sent at 06/20/2019  3:02 PM EDT ----- This patient tested positive for :  Chlamydia  She :"has NKDA",I have informed the patient of her results and confirmed her pharmacy is correct in her chart. Please send Rx.   Thank you,   Kathe Becton, RN   Results faxed to [facility] Department.

## 2019-07-27 ENCOUNTER — Encounter (HOSPITAL_BASED_OUTPATIENT_CLINIC_OR_DEPARTMENT_OTHER): Payer: Self-pay | Admitting: Emergency Medicine

## 2019-07-27 ENCOUNTER — Other Ambulatory Visit: Payer: Self-pay

## 2019-07-27 ENCOUNTER — Other Ambulatory Visit (HOSPITAL_BASED_OUTPATIENT_CLINIC_OR_DEPARTMENT_OTHER): Payer: Medicaid Other

## 2019-07-27 ENCOUNTER — Emergency Department (HOSPITAL_BASED_OUTPATIENT_CLINIC_OR_DEPARTMENT_OTHER)
Admission: EM | Admit: 2019-07-27 | Discharge: 2019-07-27 | Disposition: A | Discharge status: Home / Self Care | Payer: Medicaid Other | Attending: Emergency Medicine | Admitting: Emergency Medicine

## 2019-07-27 DIAGNOSIS — R102 Pelvic and perineal pain: Secondary | ICD-10-CM | POA: Diagnosis not present

## 2019-07-27 DIAGNOSIS — R109 Unspecified abdominal pain: Secondary | ICD-10-CM | POA: Diagnosis present

## 2019-07-27 LAB — WET PREP, GENITAL
Clue Cells Wet Prep HPF POC: NONE SEEN
Sperm: NONE SEEN
Trich, Wet Prep: NONE SEEN
Yeast Wet Prep HPF POC: NONE SEEN

## 2019-07-27 LAB — URINALYSIS, ROUTINE W REFLEX MICROSCOPIC
Bilirubin Urine: NEGATIVE
Glucose, UA: NEGATIVE mg/dL
Hgb urine dipstick: NEGATIVE
Ketones, ur: NEGATIVE mg/dL
Leukocytes,Ua: NEGATIVE
Nitrite: NEGATIVE
Protein, ur: NEGATIVE mg/dL
Specific Gravity, Urine: 1.015 (ref 1.005–1.030)
pH: 6 (ref 5.0–8.0)

## 2019-07-27 NOTE — ED Provider Notes (Signed)
[facility] Provider Note: Beverly Dell, MD, FACEP  CSN: [id number] MRN: [id number] ARRIVAL: 07/27/19 at 0313 ROOM: MH02/MH02   CHIEF COMPLAINT  Abdominal Pain   HISTORY OF PRESENT ILLNESS  07/27/19 3:38 AM [name] is a 24 y.o. female who was seen at [facility] on 06/30/2019 and was treated for bacterial vaginosis (Flagyl, but did not complete her prescription) having already been treated for chlamydia on 06/20/2019 (Zithromax).  She was about [redacted] weeks pregnant at time, G5, P3 A1.  She subsequently had a pharmacologically induced abortion 2 weeks ago.  Bleeding and pain from that had resolved.  She is here with suprapubic pain that began yesterday.  She rates it as a 6 out of 10 and describes it as aching and burning in nature.  She denies vaginal bleeding at the present time.  She is concerned she may not have been adequately treated for chlamydia.  The pain is worse with palpation or movement.   Past Medical History:  Diagnosis Date  . Obesity   . UTI (urinary tract infection)     Past Surgical History:  Procedure Laterality Date  . EXTERNAL EAR SURGERY Bilateral 2017   keloid removal  . INDUCED ABORTION    . keloid     removal on ear    Family History  Problem Relation Age of Onset  . Cancer Paternal Grandfather   . Diabetes Father   . Hypertension Father     Social History   Tobacco Use  . Smoking status: Never Smoker  . Smokeless tobacco: Never Used  Substance Use Topics  . Alcohol use: No  . Drug use: No    Prior to Admission medications   Not on File    Allergies Patient has no known allergies.   REVIEW OF SYSTEMS  Negative except as noted here or in the History of Present Illness.   PHYSICAL EXAMINATION  Initial Vital Signs Blood pressure 119/64, pulse 78, temperature 98.3 F (36.8 C), temperature source Oral, resp. rate 20, height 5\' 2"  (1.575 m), weight 117 kg, last menstrual period 05/24/2019, SpO2 100 %, unknown  if currently breastfeeding.  Examination General: Well-developed, well-nourished female in no acute distress; appearance consistent with age of record HENT: normocephalic; atraumatic Eyes: pupils equal, round and reactive to light; extraocular muscles intact Neck: supple Heart: regular rate and rhythm Lungs: clear to auscultation bilaterally Abdomen: soft; nondistended; suprapubic tenderness; bowel sounds present GU: Normal external genitalia; mucoid, blood-streaked discharge per cervical os; cervical motion tenderness; difficult to palpate adnexa due to body habitus Extremities: No deformity; full range of motion; pulses normal Neurologic: Awake, alert and oriented; motor function intact in all extremities and symmetric; no facial droop Skin: Warm and dry Psychiatric: Normal mood and affect   RESULTS  Summary of this visit's results, reviewed and interpreted by myself:   EKG Interpretation  Date/Time:    Ventricular Rate:    PR Interval:    QRS Duration:   QT Interval:    QTC Calculation:   R Axis:     Text Interpretation:        Laboratory Studies: Results for orders placed or performed during the hospital encounter of 07/27/19 (from the past 24 hour(s))  Urinalysis, Routine w reflex microscopic     Status: None   Collection Time: 07/27/19  3:46 AM  Result Value Ref Range   Color, Urine YELLOW YELLOW   APPearance CLEAR CLEAR   Specific Gravity, Urine 1.015 1.005 - 1.030  pH 6.0 5.0 - 8.0   Glucose, UA NEGATIVE NEGATIVE mg/dL   Hgb urine dipstick NEGATIVE NEGATIVE   Bilirubin Urine NEGATIVE NEGATIVE   Ketones, ur NEGATIVE NEGATIVE mg/dL   Protein, ur NEGATIVE NEGATIVE mg/dL   Nitrite NEGATIVE NEGATIVE   Leukocytes,Ua NEGATIVE NEGATIVE  Wet prep, genital     Status: Abnormal   Collection Time: 07/27/19  3:46 AM   Specimen: Vaginal  Result Value Ref Range   Yeast Wet Prep HPF POC NONE SEEN NONE SEEN   Trich, Wet Prep NONE SEEN NONE SEEN   Clue Cells Wet Prep HPF  POC NONE SEEN NONE SEEN   WBC, Wet Prep HPF POC MODERATE (A) NONE SEEN   Sperm NONE SEEN    Imaging Studies: No results found.  ED COURSE and MDM  Nursing notes, initial and subsequent vitals signs, including pulse oximetry, reviewed and interpreted by myself.  Vitals:   07/27/19 0330 07/27/19 0332  BP: 119/64   Pulse: 78   Resp: 20   Temp: 98.3 F (36.8 C)   TempSrc: Oral   SpO2: 100%   Weight:  117 kg  Height:  5\' 2"  (1.575 m)   Medications - No data to display  Will have patient return later this morning for pelvic ultrasound.  PROCEDURES  Procedures   ED DIAGNOSES     ICD-10-CM   1. Pelvic pain in female  R10.2        Eneida Evers, Jenny Reichmann, MD 07/27/19 [phone]

## 2019-07-27 NOTE — ED Triage Notes (Signed)
Prescribed flagyl on 4/14; states did not complete prescription

## 2019-07-27 NOTE — ED Triage Notes (Signed)
Patient presents with complaints of lower abd pain; states she had an abortion 2 weeks ago and states was treated for std a week prior to that. Denies vaginal bleeding or discharge at this time.

## 2019-07-29 ENCOUNTER — Telehealth: Payer: Self-pay | Admitting: Student

## 2019-07-29 DIAGNOSIS — A749 Chlamydial infection, unspecified: Secondary | ICD-10-CM

## 2019-07-29 LAB — GC/CHLAMYDIA PROBE AMP (~~LOC~~) NOT AT ARMC
Chlamydia: POSITIVE — AB
Comment: NEGATIVE
Comment: NORMAL
Neisseria Gonorrhea: NEGATIVE

## 2019-07-29 MED ORDER — AZITHROMYCIN 500 MG PO TABS: 1000.0000 mg | ORAL_TABLET | Freq: Once | ORAL | 0 refills | Status: AC

## 2019-07-29 NOTE — Telephone Encounter (Addendum)
Beverly Anthony tested positive for  Chlamydia. Patient was called by RN and allergies and pharmacy confirmed. Rx sent to pharmacy of choice.   Judeth Horn, NP 07/29/2019 2:48 PM       ----- Message from Kathe Becton, RN sent at 07/29/2019  2:25 PM EDT ----- This patient tested positive for :  chlamydia  She "has NKDA", I have informed the patient of her results and confirmed her pharmacy is correct in her chart. Please send Rx.   Thank you,   Kathe Becton, RN   Results faxed to [facility] Department.

## 2019-11-25 ENCOUNTER — Inpatient Hospital Stay (HOSPITAL_COMMUNITY): Payer: Medicaid Other

## 2019-11-25 ENCOUNTER — Ambulatory Visit (HOSPITAL_COMMUNITY)
Admission: EM | Admit: 2019-11-25 | Discharge: 2019-11-25 | Disposition: A | Discharge status: Home / Self Care | Payer: Medicaid Other

## 2019-11-25 ENCOUNTER — Inpatient Hospital Stay (HOSPITAL_COMMUNITY)
Admission: AD | Admit: 2019-11-25 | Discharge: 2019-11-25 | Disposition: A | Discharge status: Home / Self Care | Payer: Medicaid Other | Attending: Obstetrics and Gynecology | Admitting: Obstetrics and Gynecology

## 2019-11-25 ENCOUNTER — Other Ambulatory Visit: Payer: Self-pay

## 2019-11-25 ENCOUNTER — Encounter (HOSPITAL_COMMUNITY): Payer: Self-pay | Admitting: Obstetrics and Gynecology

## 2019-11-25 DIAGNOSIS — O3680X0 Pregnancy with inconclusive fetal viability, not applicable or unspecified: Secondary | ICD-10-CM | POA: Diagnosis not present

## 2019-11-25 DIAGNOSIS — Z3A01 Less than 8 weeks gestation of pregnancy: Secondary | ICD-10-CM | POA: Insufficient documentation

## 2019-11-25 DIAGNOSIS — R1030 Lower abdominal pain, unspecified: Secondary | ICD-10-CM | POA: Insufficient documentation

## 2019-11-25 DIAGNOSIS — O26891 Other specified pregnancy related conditions, first trimester: Secondary | ICD-10-CM | POA: Insufficient documentation

## 2019-11-25 DIAGNOSIS — O26899 Other specified pregnancy related conditions, unspecified trimester: Secondary | ICD-10-CM

## 2019-11-25 DIAGNOSIS — R109 Unspecified abdominal pain: Secondary | ICD-10-CM | POA: Diagnosis not present

## 2019-11-25 DIAGNOSIS — Z87440 Personal history of urinary (tract) infections: Secondary | ICD-10-CM | POA: Diagnosis not present

## 2019-11-25 DIAGNOSIS — Z202 Contact with and (suspected) exposure to infections with a predominantly sexual mode of transmission: Secondary | ICD-10-CM | POA: Diagnosis not present

## 2019-11-25 LAB — URINALYSIS, ROUTINE W REFLEX MICROSCOPIC
Bilirubin Urine: NEGATIVE
Glucose, UA: NEGATIVE mg/dL
Hgb urine dipstick: NEGATIVE
Ketones, ur: NEGATIVE mg/dL
Leukocytes,Ua: NEGATIVE
Nitrite: NEGATIVE
Protein, ur: NEGATIVE mg/dL
Specific Gravity, Urine: 1.01 (ref 1.005–1.030)
pH: 6 (ref 5.0–8.0)

## 2019-11-25 LAB — COMPREHENSIVE METABOLIC PANEL
ALT: 13 U/L (ref 0–44)
AST: 16 U/L (ref 15–41)
Albumin: 3.2 g/dL — ABNORMAL LOW (ref 3.5–5.0)
Alkaline Phosphatase: 97 U/L (ref 38–126)
Anion gap: 8 (ref 5–15)
BUN: 8 mg/dL (ref 6–20)
CO2: 25 mmol/L (ref 22–32)
Calcium: 9.2 mg/dL (ref 8.9–10.3)
Chloride: 103 mmol/L (ref 98–111)
Creatinine, Ser: 0.98 mg/dL (ref 0.44–1.00)
GFR calc Af Amer: >60 mL/min (ref >60)
GFR calc non Af Amer: >60 mL/min (ref >60)
Glucose, Bld: 91 mg/dL (ref 70–99)
Potassium: 4 mmol/L (ref 3.5–5.1)
Sodium: 136 mmol/L (ref 135–145)
Total Bilirubin: 0.4 mg/dL (ref 0.3–1.2)
Total Protein: 7.5 g/dL (ref 6.5–8.1)

## 2019-11-25 LAB — CBC WITH DIFFERENTIAL/PLATELET
Abs Immature Granulocytes: 0.03 10*3/uL (ref 0.00–0.07)
Basophils Absolute: 0 10*3/uL (ref 0.0–0.1)
Basophils Relative: 0 %
Eosinophils Absolute: 0.3 10*3/uL (ref 0.0–0.5)
Eosinophils Relative: 3 %
HCT: 35.1 % — ABNORMAL LOW (ref 36.0–46.0)
Hemoglobin: 11.6 g/dL — ABNORMAL LOW (ref 12.0–15.0)
Immature Granulocytes: 0 %
Lymphocytes Relative: 26 %
Lymphs Abs: 3 10*3/uL (ref 0.7–4.0)
MCH: 26.5 pg (ref 26.0–34.0)
MCHC: 33 g/dL (ref 30.0–36.0)
MCV: 80.3 fL (ref 80.0–100.0)
Monocytes Absolute: 0.6 10*3/uL (ref 0.1–1.0)
Monocytes Relative: 5 %
Neutro Abs: 7.6 10*3/uL (ref 1.7–7.7)
Neutrophils Relative %: 66 %
Platelets: 249 10*3/uL (ref 150–400)
RBC: 4.37 MIL/uL (ref 3.87–5.11)
RDW: 13.4 % (ref 11.5–15.5)
WBC: 11.6 10*3/uL — ABNORMAL HIGH (ref 4.0–10.5)
nRBC: 0 % (ref 0.0–0.2)

## 2019-11-25 LAB — WET PREP, GENITAL
Clue Cells Wet Prep HPF POC: NONE SEEN
Sperm: NONE SEEN
Trich, Wet Prep: NONE SEEN
Yeast Wet Prep HPF POC: NONE SEEN

## 2019-11-25 LAB — POCT PREGNANCY, URINE: Preg Test, Ur: POSITIVE — AB

## 2019-11-25 LAB — HIV ANTIBODY (ROUTINE TESTING W REFLEX): HIV Screen 4th Generation wRfx: NONREACTIVE

## 2019-11-25 LAB — HCG, QUANTITATIVE, PREGNANCY: hCG, Beta Chain, Quant, S: 40 m[IU]/mL — ABNORMAL HIGH (ref <5)

## 2019-11-25 MED ORDER — AZITHROMYCIN 250 MG PO TABS
1000.0000 mg | ORAL_TABLET | Freq: Once | ORAL | Status: AC
  Administered 2019-11-25: 1000 mg via ORAL
  Filled 2019-11-25: qty 4

## 2019-11-25 NOTE — MAU Note (Signed)
Pt stated she has lower abd pain that started 2 days ago . Pain is sharp and constant.Reports pain with intercourse as well. Denies pain with urination.Reports white vag discharge with slight odor.

## 2019-11-25 NOTE — Discharge Instructions (Signed)
Abdominal Pain During Pregnancy  Belly (abdominal) pain is common during pregnancy. There are many possible causes. Most of the time, it is not a serious problem. Other times, it can be a sign that something is wrong with the pregnancy. Always tell your doctor if you have belly pain. Follow these instructions at home:  Do not have sex or put anything in your vagina until your pain goes away completely.  Get plenty of rest until your pain gets better.  Drink enough fluid to keep your pee (urine) pale yellow.  Take over-the-counter and prescription medicines only as told by your doctor.  Keep all follow-up visits as told by your doctor. This is important. Contact a doctor if:  Your pain continues or gets worse after resting.  You have lower belly pain that: ? Comes and goes at regular times. ? Spreads to your back. ? Feels like menstrual cramps.  You have pain or burning when you pee (urinate). Get help right away if:  You have a fever or chills.  You have vaginal bleeding.  You are leaking fluid from your vagina.  You are passing tissue from your vagina.  You throw up (vomit) for more than 24 hours.  You have watery poop (diarrhea) for more than 24 hours.  Your baby is moving less than usual.  You feel very weak or faint.  You have shortness of breath.  You have very bad pain in your upper belly. Summary  Belly (abdominal) pain is common during pregnancy. There are many possible causes.  If you have belly pain during pregnancy, tell your doctor right away.  Keep all follow-up visits as told by your doctor. This is important. This information is not intended to replace advice given to you by your health care provider. Make sure you discuss any questions you have with your health care provider. Document Revised: 06/25/2018 Document Reviewed: 06/09/2016 Elsevier Patient Education  2020 [employer]  

## 2019-11-25 NOTE — MAU Provider Note (Signed)
History     CSN: [id number]  Arrival date and time: 11/25/19 0944   First Provider Initiated Contact with Patient 11/25/19 1048      Chief Complaint  Patient presents with  . Abdominal Pain   HPI   Ms.[place] Galasso is a 24 y.o. female [phone] @ [redacted]w[redacted]d here in MAU reporting lower abdominal pain. The pain has been there for a few weeks, however in the last 2 days it became worse. She has not missed her period as of yet, however her period was due in 2 days. No bleeding. No N/V. She currently rates her pain 5/10; it comes and goes. She has not taken any medication.  Exposure to chlamydia, requesting treatment.   OB History    Gravida  6   Para  3   Term  3   Preterm  0   AB  2   Living  3     SAB  1   TAB  1   Ectopic  0   Multiple  0   Live Births  3        Obstetric Comments  03/12/14 SAB 12 wks        Past Medical History:  Diagnosis Date  . Obesity   . UTI (urinary tract infection)     Past Surgical History:  Procedure Laterality Date  . EXTERNAL EAR SURGERY Bilateral 2017   keloid removal  . INDUCED ABORTION    . keloid     removal on ear    Family History  Problem Relation Age of Onset  . Cancer Paternal Grandfather   . Diabetes Father   . Hypertension Father     Social History   Tobacco Use  . Smoking status: Never Smoker  . Smokeless tobacco: Never Used  Vaping Use  . Vaping Use: Never used  Substance Use Topics  . Alcohol use: No  . Drug use: No    Allergies: No Known Allergies  No medications prior to admission.   Results for orders placed or performed during the hospital encounter of 11/25/19 (from the past 48 hour(s))  Pregnancy, urine POC     Status: Abnormal   Collection Time: 11/25/19 10:21 AM  Result Value Ref Range   Preg Test, Ur POSITIVE (A) NEGATIVE    Comment:        THE SENSITIVITY OF THIS METHODOLOGY IS >24 mIU/mL   Urinalysis, Routine w reflex microscopic Urine, Clean Catch     Status: Abnormal    Collection Time: 11/25/19 10:30 AM  Result Value Ref Range   Color, Urine STRAW (A) YELLOW   APPearance CLEAR CLEAR   Specific Gravity, Urine 1.010 1.005 - 1.030   pH 6.0 5.0 - 8.0   Glucose, UA NEGATIVE NEGATIVE mg/dL   Hgb urine dipstick NEGATIVE NEGATIVE   Bilirubin Urine NEGATIVE NEGATIVE   Ketones, ur NEGATIVE NEGATIVE mg/dL   Protein, ur NEGATIVE NEGATIVE mg/dL   Nitrite NEGATIVE NEGATIVE   Leukocytes,Ua NEGATIVE NEGATIVE    Comment: Performed at [facility], [address] [place], [place] [address]  Wet prep, genital     Status: Abnormal   Collection Time: 11/25/19 10:53 AM  Result Value Ref Range   Yeast Wet Prep HPF POC NONE SEEN NONE SEEN   Trich, Wet Prep NONE SEEN NONE SEEN   Clue Cells Wet Prep HPF POC NONE SEEN NONE SEEN   WBC, Wet Prep HPF POC FEW (A) NONE SEEN   Sperm NONE SEEN  Comment: Performed at [facility], [address] [place], [place] [address]  CBC with Differential/Platelet     Status: Abnormal   Collection Time: 11/25/19 11:17 AM  Result Value Ref Range   WBC 11.6 (H) 4.0 - 10.5 K/uL   RBC 4.37 3.87 - 5.11 MIL/uL   Hemoglobin 11.6 (L) 12.0 - 15.0 g/dL   HCT 67.6 (L) 36 - 46 %   MCV 80.3 80.0 - 100.0 fL   MCH 26.5 26.0 - 34.0 pg   MCHC 33.0 30.0 - 36.0 g/dL   RDW 19.5 09.3 - 26.7 %   Platelets 249 150 - 400 K/uL   nRBC 0.0 0.0 - 0.2 %   Neutrophils Relative % 66 %   Neutro Abs 7.6 1.7 - 7.7 K/uL   Lymphocytes Relative 26 %   Lymphs Abs 3.0 0.7 - 4.0 K/uL   Monocytes Relative 5 %   Monocytes Absolute 0.6 0 - 1 K/uL   Eosinophils Relative 3 %   Eosinophils Absolute 0.3 0 - 0 K/uL   Basophils Relative 0 %   Basophils Absolute 0.0 0 - 0 K/uL   Immature Granulocytes 0 %   Abs Immature Granulocytes 0.03 0.00 - 0.07 K/uL    Comment: Performed at [facility], [address], [place], [place] [address]  Comprehensive metabolic panel     Status: Abnormal   Collection Time: 11/25/19 11:17 AM  Result Value Ref Range    Sodium 136 135 - 145 mmol/L   Potassium 4.0 3.5 - 5.1 mmol/L   Chloride 103 98 - 111 mmol/L   CO2 25 22 - 32 mmol/L   Glucose, Bld 91 70 - 99 mg/dL    Comment: Glucose reference range applies only to samples taken after fasting for at least 8 hours.   BUN 8 6 - 20 mg/dL   Creatinine, Ser 0.99 0.44 - 1.00 mg/dL   Calcium 9.2 8.9 - 83.3 mg/dL   Total Protein 7.5 6.5 - 8.1 g/dL   Albumin 3.2 (L) 3.5 - 5.0 g/dL   AST 16 15 - 41 U/L   ALT 13 0 - 44 U/L   Alkaline Phosphatase 97 38 - 126 U/L   Total Bilirubin 0.4 0.3 - 1.2 mg/dL   GFR calc non Af Amer >60 >60 mL/min   GFR calc Af Amer >60 >60 mL/min   Anion gap 8 5 - 15    Comment: Performed at [facility], [address] [place], [place] [address]  hCG, quantitative, pregnancy     Status: Abnormal   Collection Time: 11/25/19 11:17 AM  Result Value Ref Range   hCG, Beta Chain, Quant, S 40 (H) <5 mIU/mL    Comment:          GEST. AGE      CONC.  (mIU/mL)   <=1 WEEK        5 - 50     2 WEEKS       50 - 500     3 WEEKS       100 - 10,000     4 WEEKS     1,000 - 30,000     5 WEEKS     3,500 - 115,000   6-8 WEEKS     12,000 - 270,000    12 WEEKS     15,000 - 220,000        FEMALE AND NON-PREGNANT FEMALE:     LESS THAN 5 mIU/mL  Performed at [facility], [address] [place], [place] [address]   HIV Antibody (routine testing w rflx)     Status: None   Collection Time: 11/25/19 11:17 AM  Result Value Ref Range   HIV Screen 4th Generation wRfx Non Reactive Non Reactive    Comment: Performed at [facility], [address], [place], [place] [address]   US OB LESS THAN 14 WEEKS WITH [place] TRANSVAGINAL  Result Date: 11/25/2019 CLINICAL DATA:  Pregnancy, pain. EXAM: OBSTETRIC <14 WK [place] AND TRANSVAGINAL OB US TECHNIQUE: Both transabdominal and transvaginal ultrasound examinations were performed for complete evaluation of the gestation as well as the maternal uterus, adnexal regions, and pelvic cul-de-sac. Transvaginal  technique was performed to assess early pregnancy. COMPARISON:  None. FINDINGS: Intrauterine gestational sac: No normal appearing gestational sac is identified. Fluid like collection within the fundal portion of the endometrial canal is irregular and more likely blood products than gestational sac. Yolk sac:  Not Seen Embryo:  Not Seen Cardiac Activity: Not Seen Maternal uterus/adnexae: Uterus is otherwise unremarkable. Probable corpus luteum within the RIGHT ovary. No mass or free fluid within the adjacent RIGHT adnexal region. LEFT ovary appears normal. No mass or free fluid in the LEFT adnexal region. Trace simple appearing free fluid in the cul-de-sac. IMPRESSION: 1. No normal-appearing intrauterine pregnancy is identified. Fluid-like collection within the fundal portion of the endometrial canal is irregular and more likely blood products than gestational sac. This may indicate spontaneous abortion in progress. 2. No evidence of ectopic pregnancy. No mass or free fluid within either adnexal region. 3. Probable corpus luteum within the RIGHT ovary. Electronically Signed   By: Bary Richard M.D.   On: 11/25/2019 11:58    Review of Systems  Constitutional: Negative for fever.  Gastrointestinal: Positive for abdominal pain.  Genitourinary: Negative for vaginal bleeding.   Physical Exam   Blood pressure 112/68, pulse 92, temperature 98.7 F (37.1 C), resp. rate 18, height 5\' 2"  (1.575 m), weight 115.7 kg, last menstrual period 10/27/2019, unknown if currently breastfeeding.  Physical Exam Constitutional:      General: She is not in acute distress.    Appearance: She is well-developed. She is not ill-appearing or toxic-appearing.  HENT:     Head: Normocephalic.  Pulmonary:     Effort: Pulmonary effort is normal.  Abdominal:     Tenderness: There is generalized abdominal tenderness. There is no guarding or rebound.  Skin:    General: Skin is warm.  Neurological:     Mental Status: She is alert  and oriented to person, place, and time.    MAU Course  Procedures  None  MDM  B positive blood type.  Wet prep & GC HIV, CBC, Hcg, ABO 12/27/2019 OB transvaginal   Assessment and Plan   A:  1. Pregnancy of unknown anatomic location   2. Abdominal pain affecting pregnancy   3. Exposure to chlamydia     P:  Discharge home in stable condition Return to MAU if symptoms worsen Azithromycin given in MAU Condoms Follow up on Wed at [facility] for repeat labs Ectopic precautions Pelvic rest   Galileah Piggee, [facility] NP 11/25/2019 7:44 PM

## 2019-11-26 LAB — GC/CHLAMYDIA PROBE AMP (~~LOC~~) NOT AT ARMC
Chlamydia: NEGATIVE
Comment: NEGATIVE
Comment: NORMAL
Neisseria Gonorrhea: NEGATIVE

## 2019-11-27 ENCOUNTER — Ambulatory Visit: Payer: Medicaid Other

## 2019-11-28 ENCOUNTER — Telehealth: Payer: Self-pay

## 2019-11-28 ENCOUNTER — Telehealth: Payer: Self-pay | Admitting: Family Medicine

## 2019-11-28 NOTE — Telephone Encounter (Signed)
Attempted to reach patient to get her rescheduled for her appointment.

## 2019-11-28 NOTE — Telephone Encounter (Signed)
Called pt to follow up on stat beta nurse visit from yesterday. Office was closed yesterday afternoon so pt unable to come for appt. VM let requesting a call back to reschedule this appt.

## 2019-12-29 ENCOUNTER — Other Ambulatory Visit: Payer: Self-pay

## 2019-12-29 ENCOUNTER — Inpatient Hospital Stay (HOSPITAL_COMMUNITY): Payer: Medicaid Other

## 2019-12-29 ENCOUNTER — Inpatient Hospital Stay (HOSPITAL_COMMUNITY)
Admission: AD | Admit: 2019-12-29 | Discharge: 2019-12-29 | Disposition: A | Discharge status: Home / Self Care | Payer: Medicaid Other | Source: Ambulatory Visit | Attending: Obstetrics & Gynecology | Admitting: Obstetrics & Gynecology

## 2019-12-29 ENCOUNTER — Encounter (HOSPITAL_COMMUNITY): Payer: Self-pay | Admitting: Obstetrics & Gynecology

## 2019-12-29 DIAGNOSIS — O219 Vomiting of pregnancy, unspecified: Secondary | ICD-10-CM

## 2019-12-29 DIAGNOSIS — Z8249 Family history of ischemic heart disease and other diseases of the circulatory system: Secondary | ICD-10-CM | POA: Diagnosis not present

## 2019-12-29 DIAGNOSIS — O23591 Infection of other part of genital tract in pregnancy, first trimester: Secondary | ICD-10-CM | POA: Insufficient documentation

## 2019-12-29 DIAGNOSIS — E669 Obesity, unspecified: Secondary | ICD-10-CM | POA: Diagnosis not present

## 2019-12-29 DIAGNOSIS — O99611 Diseases of the digestive system complicating pregnancy, first trimester: Secondary | ICD-10-CM | POA: Insufficient documentation

## 2019-12-29 DIAGNOSIS — K117 Disturbances of salivary secretion: Secondary | ICD-10-CM | POA: Diagnosis not present

## 2019-12-29 DIAGNOSIS — Z3A08 8 weeks gestation of pregnancy: Secondary | ICD-10-CM | POA: Insufficient documentation

## 2019-12-29 DIAGNOSIS — Z3491 Encounter for supervision of normal pregnancy, unspecified, first trimester: Secondary | ICD-10-CM

## 2019-12-29 DIAGNOSIS — O99211 Obesity complicating pregnancy, first trimester: Secondary | ICD-10-CM | POA: Insufficient documentation

## 2019-12-29 DIAGNOSIS — Z3A09 9 weeks gestation of pregnancy: Secondary | ICD-10-CM

## 2019-12-29 DIAGNOSIS — B9689 Other specified bacterial agents as the cause of diseases classified elsewhere: Secondary | ICD-10-CM

## 2019-12-29 DIAGNOSIS — O3680X0 Pregnancy with inconclusive fetal viability, not applicable or unspecified: Secondary | ICD-10-CM

## 2019-12-29 LAB — WET PREP, GENITAL
Sperm: NONE SEEN
Trich, Wet Prep: NONE SEEN
Yeast Wet Prep HPF POC: NONE SEEN

## 2019-12-29 LAB — URINALYSIS, ROUTINE W REFLEX MICROSCOPIC
Bacteria, UA: NONE SEEN
Bilirubin Urine: NEGATIVE
Glucose, UA: NEGATIVE mg/dL
Hgb urine dipstick: NEGATIVE
Ketones, ur: NEGATIVE mg/dL
Nitrite: NEGATIVE
Protein, ur: NEGATIVE mg/dL
Specific Gravity, Urine: 1.013 (ref 1.005–1.030)
pH: 6 (ref 5.0–8.0)

## 2019-12-29 LAB — COMPREHENSIVE METABOLIC PANEL
ALT: 10 U/L (ref 0–44)
AST: 13 U/L — ABNORMAL LOW (ref 15–41)
Albumin: 2.6 g/dL — ABNORMAL LOW (ref 3.5–5.0)
Alkaline Phosphatase: 74 U/L (ref 38–126)
Anion gap: 6 (ref 5–15)
BUN: 5 mg/dL — ABNORMAL LOW (ref 6–20)
CO2: 27 mmol/L (ref 22–32)
Calcium: 8.8 mg/dL — ABNORMAL LOW (ref 8.9–10.3)
Chloride: 103 mmol/L (ref 98–111)
Creatinine, Ser: 0.76 mg/dL (ref 0.44–1.00)
GFR, Estimated: >60 mL/min (ref >60)
Glucose, Bld: 92 mg/dL (ref 70–99)
Potassium: 3.8 mmol/L (ref 3.5–5.1)
Sodium: 136 mmol/L (ref 135–145)
Total Bilirubin: <0.1 mg/dL — ABNORMAL LOW (ref 0.3–1.2)
Total Protein: 6.1 g/dL — ABNORMAL LOW (ref 6.5–8.1)

## 2019-12-29 LAB — CBC
HCT: 35.5 % — ABNORMAL LOW (ref 36.0–46.0)
Hemoglobin: 12.1 g/dL (ref 12.0–15.0)
MCH: 27.4 pg (ref 26.0–34.0)
MCHC: 34.1 g/dL (ref 30.0–36.0)
MCV: 80.5 fL (ref 80.0–100.0)
Platelets: 243 10*3/uL (ref 150–400)
RBC: 4.41 MIL/uL (ref 3.87–5.11)
RDW: 13.3 % (ref 11.5–15.5)
WBC: 13 10*3/uL — ABNORMAL HIGH (ref 4.0–10.5)
nRBC: 0 % (ref 0.0–0.2)

## 2019-12-29 LAB — HCG, QUANTITATIVE, PREGNANCY: hCG, Beta Chain, Quant, S: 106317 m[IU]/mL — ABNORMAL HIGH (ref <5)

## 2019-12-29 MED ORDER — GLYCOPYRROLATE 1 MG PO TABS
2.0000 mg | ORAL_TABLET | Freq: Once | ORAL | Status: AC
  Administered 2019-12-29: 2 mg via ORAL
  Filled 2019-12-29: qty 2

## 2019-12-29 MED ORDER — GLYCOPYRROLATE 1 MG PO TABS: 1.0000 mg | ORAL_TABLET | Freq: Three times a day (TID) | ORAL | 0 refills | Status: DC

## 2019-12-29 MED ORDER — ONDANSETRON 4 MG PO TBDP
4.0000 mg | ORAL_TABLET | Freq: Once | ORAL | Status: AC
  Administered 2019-12-29: 4 mg via ORAL
  Filled 2019-12-29: qty 1

## 2019-12-29 MED ORDER — ONDANSETRON 4 MG PO TBDP: 4.0000 mg | ORAL_TABLET | Freq: Three times a day (TID) | ORAL | 2 refills | Status: DC | PRN

## 2019-12-29 MED ORDER — METRONIDAZOLE 500 MG PO TABS: 500.0000 mg | ORAL_TABLET | Freq: Two times a day (BID) | ORAL | 0 refills | Status: DC

## 2019-12-29 NOTE — Discharge Instructions (Signed)
[place] [facility] Providers    [facility]       Phone: [phone]  [facility]   Phone: [phone]  [facility] [place]  Phone: [phone]  [facility] [place]  Phone: [phone]  [facility] [place]  Phone: [phone]  [facility]   Phone: [phone]  [facility]       Phone: [phone]  [facility]    Phone: [phone]   [facility]    Phone: [phone]  [facility]    Phone: [phone]  [facility]    Phone: [phone]  [facility]       Phone: [phone]   [facility]  Phone: [phone]   [facility]    Phone: [phone]  [facility]   Phone: [phone]  [facility]      Phone: [phone]  [facility]    Phone: [phone]   
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
## 2019-12-29 NOTE — MAU Provider Note (Signed)
History     CSN: [id number]  Arrival date and time: 12/29/19 2005   First Provider Initiated Contact with Patient 12/29/19 2054      Chief Complaint  Patient presents with  . Abdominal Pain   [place] Stracke is 24 y.o. G6P3 at [redacted]w[redacted]d who presents to MAU with complaints of abdominal pain and nausea. Patient reports abdominal pain has been occurring intermittently since finding out she was pregnant but got worse yesterday afternoon. Patient describes the abdominal pain as lower abdominal cramping, rates pain 3/10. Patient reports these symptoms occurred before and she was treated for chlamydia, patient reports that she may need treatment again because she started having vaginal discharge again. She reports N/V started occurring over the past 3 weeks. Reports emesis 3x over the past 24 hours. She does not have any medication at home for N/V. She denies vaginal bleeding.    OB History    Gravida  6   Para  3   Term  3   Preterm  0   AB  2   Living  3     SAB  1   TAB  1   Ectopic  0   Multiple  0   Live Births  3        Obstetric Comments  03/12/14 SAB 12 wks        Past Medical History:  Diagnosis Date  . Obesity   . UTI (urinary tract infection)     Past Surgical History:  Procedure Laterality Date  . EXTERNAL EAR SURGERY Bilateral 2017   keloid removal  . INDUCED ABORTION    . keloid     removal on ear    Family History  Problem Relation Age of Onset  . Cancer Paternal Grandfather   . Diabetes Father   . Hypertension Father     Social History   Tobacco Use  . Smoking status: Never Smoker  . Smokeless tobacco: Never Used  Vaping Use  . Vaping Use: Never used  Substance Use Topics  . Alcohol use: No  . Drug use: No    Allergies: No Known Allergies  No medications prior to admission.    Review of Systems  Constitutional: Negative.   Respiratory: Negative.   Cardiovascular: Negative.   Gastrointestinal: Positive for abdominal pain,  nausea and vomiting. Negative for constipation and diarrhea.  Genitourinary: Positive for vaginal discharge. Negative for difficulty urinating, dysuria, frequency, pelvic pain, urgency and vaginal bleeding.  Musculoskeletal: Negative.   Neurological: Negative.   Psychiatric/Behavioral: Negative.    Physical Exam   Blood pressure 127/77, pulse 80, temperature 98.6 F (37 C), resp. rate 16, height 5\' 2"  (1.575 m), weight 115.6 kg, last menstrual period 10/27/2019, SpO2 100 %, unknown if currently breastfeeding.  Physical Exam Vitals and nursing note reviewed.  Cardiovascular:     Rate and Rhythm: Normal rate and regular rhythm.  Pulmonary:     Effort: Pulmonary effort is normal. No respiratory distress.     Breath sounds: Normal breath sounds. No wheezing.  Abdominal:     General: There is no distension.     Palpations: Abdomen is soft. There is no mass.     Tenderness: There is no abdominal tenderness. There is no guarding.  Genitourinary:    Comments: Blind swabs collected Bimanual exam: Cervix 0/long/high, firm, anterior, neg CMT, uterus nontender, nonenlarged, adnexa without tenderness, enlargement, or mass Skin:    General: Skin is warm and dry.  Neurological:  Mental Status: She is alert and oriented to person, place, and time.  Psychiatric:        Mood and Affect: Mood normal.        Behavior: Behavior normal.        Thought Content: Thought content normal.    MAU Course  Procedures  MDM Patient was seen in MAU on 9/6 - dx with PUL, was to be followed in the office for HCG but appointment was never rescheduled  Patient reports being treated for Chlamydia that day as well, GC/C negative on 9/6  Orders Placed This Encounter  Procedures  . Wet prep, genital  . US OB Comp Less 14 Wks  . Urinalysis, Routine w reflex microscopic Urine, Clean Catch  . CBC  . Comprehensive metabolic panel  . hCG, quantitative, pregnancy   Treatments in MAU included Robinul PO and  zofran ODT   Labs and [place] report reviewed:  Results for orders placed or performed during the hospital encounter of 12/29/19 (from the past 24 hour(s))  Urinalysis, Routine w reflex microscopic Urine, Clean Catch     Status: Abnormal   Collection Time: 12/29/19  8:41 PM  Result Value Ref Range   Color, Urine YELLOW YELLOW   APPearance CLEAR CLEAR   Specific Gravity, Urine 1.013 1.005 - 1.030   pH 6.0 5.0 - 8.0   Glucose, UA NEGATIVE NEGATIVE mg/dL   Hgb urine dipstick NEGATIVE NEGATIVE   Bilirubin Urine NEGATIVE NEGATIVE   Ketones, ur NEGATIVE NEGATIVE mg/dL   Protein, ur NEGATIVE NEGATIVE mg/dL   Nitrite NEGATIVE NEGATIVE   Leukocytes,Ua SMALL (A) NEGATIVE   RBC / HPF 0-5 0 - 5 RBC/hpf   WBC, UA 0-5 0 - 5 WBC/hpf   Bacteria, UA NONE SEEN NONE SEEN   Squamous Epithelial / LPF 0-5 0 - 5  CBC     Status: Abnormal   Collection Time: 12/29/19  9:04 PM  Result Value Ref Range   WBC 13.0 (H) 4.0 - 10.5 K/uL   RBC 4.41 3.87 - 5.11 MIL/uL   Hemoglobin 12.1 12.0 - 15.0 g/dL   HCT 33.8 (L) 36 - 46 %   MCV 80.5 80.0 - 100.0 fL   MCH 27.4 26.0 - 34.0 pg   MCHC 34.1 30.0 - 36.0 g/dL   RDW 25.0 53.9 - 76.7 %   Platelets 243 150 - 400 K/uL   nRBC 0.0 0.0 - 0.2 %  Comprehensive metabolic panel     Status: Abnormal   Collection Time: 12/29/19  9:04 PM  Result Value Ref Range   Sodium 136 135 - 145 mmol/L   Potassium 3.8 3.5 - 5.1 mmol/L   Chloride 103 98 - 111 mmol/L   CO2 27 22 - 32 mmol/L   Glucose, Bld 92 70 - 99 mg/dL   BUN 5 (L) 6 - 20 mg/dL   Creatinine, Ser 3.41 0.44 - 1.00 mg/dL   Calcium 8.8 (L) 8.9 - 10.3 mg/dL   Total Protein 6.1 (L) 6.5 - 8.1 g/dL   Albumin 2.6 (L) 3.5 - 5.0 g/dL   AST 13 (L) 15 - 41 U/L   ALT 10 0 - 44 U/L   Alkaline Phosphatase 74 38 - 126 U/L   Total Bilirubin <0.1 (L) 0.3 - 1.2 mg/dL   GFR, Estimated >93 >79 mL/min   Anion gap 6 5 - 15  hCG, quantitative, pregnancy     Status: Abnormal   Collection Time: 12/29/19  9:04 PM  Result Value Ref  Range    hCG, Beta Chain, Quant, S 106,317 (H) <5 mIU/mL  Wet prep, genital     Status: Abnormal   Collection Time: 12/29/19  9:13 PM  Result Value Ref Range   Yeast Wet Prep HPF POC NONE SEEN NONE SEEN   Trich, Wet Prep NONE SEEN NONE SEEN   Clue Cells Wet Prep HPF POC PRESENT (A) NONE SEEN   WBC, Wet Prep HPF POC FEW (A) NONE SEEN   Sperm NONE SEEN    US OB Comp Less 14 Wks  Result Date: 12/29/2019 CLINICAL DATA:  Pregnancy of unknown location, nausea, vomiting EXAM: OBSTETRIC <14 WK ULTRASOUND TECHNIQUE: Transabdominal ultrasound was performed for evaluation of the gestation as well as the maternal uterus and adnexal regions. COMPARISON:  11/25/2019 FINDINGS: Intrauterine gestational sac: Present, single Yolk sac:  Present, single, normal-appearing Embryo:  Present, single Cardiac Activity: Present, regular Heart Rate: 176 bpm MSD: Appropriate given fetal size CRL:   21 mm   8 w 4 d                  [place] EDC: 08/05/2020 Subchorionic hemorrhage:  None visualized. Maternal uterus/adnexae: No intrauterine masses are seen. The ovaries are normal in size and echogenicity. No adnexal masses are seen. There is no free fluid within the cul-de-sac. IMPRESSION: Single living intrauterine gestation with an estimated gestational age of [redacted] weeks, 4 days. Electronically Signed   By: Helyn Numbers MD   On: 12/29/2019 21:45   Discussed results of [place] and labs with patient. Normal IUP with FHR noted on [place] today. Picture given to patient.  Wet prep noted clue cells - discussed with patient that GC/C was negative and will hold off on treatment until results of swabs today.  Reassessment after medication - PO challenge successful   Discussed reasons to return to MAU. List of OB providers given to patient - encouraged to call to make appointment. Return to MAU as needed. Pt stable at time of discharge. Rx for Robinul, zofran, and Flagyl sent to pharmacy of choice.   Assessment and Plan   1. Nausea and vomiting during  pregnancy   2. Ptyalism   3. [redacted] weeks gestation of pregnancy   4. Normal intrauterine pregnancy on prenatal ultrasound in first trimester   5. Bacterial vaginosis     Discharge home Make appointment to initiate prenatal care Return to MAU as needed for reasons discussed and/or emergencies   Allergies as of 12/29/2019   No Known Allergies     Medication List    TAKE these medications   glycopyrrolate 1 MG tablet Commonly known as: Robinul Take 1 tablet (1 mg total) by mouth 3 (three) times daily.   metroNIDAZOLE 500 MG tablet Commonly known as: FLAGYL Take 1 tablet (500 mg total) by mouth 2 (two) times daily.   ondansetron 4 MG disintegrating tablet Commonly known as: Zofran ODT Take 1 tablet (4 mg total) by mouth every 8 (eight) hours as needed for nausea or vomiting.       Sharyon Cable CNM 12/29/2019, 10:44 PM

## 2019-12-30 LAB — GC/CHLAMYDIA PROBE AMP (~~LOC~~) NOT AT ARMC
Chlamydia: NEGATIVE
Comment: NEGATIVE
Comment: NORMAL
Neisseria Gonorrhea: NEGATIVE

## 2020-01-02 ENCOUNTER — Inpatient Hospital Stay (HOSPITAL_COMMUNITY)
Admission: AD | Admit: 2020-01-02 | Discharge: 2020-01-02 | Disposition: A | Discharge status: Home / Self Care | Payer: Medicaid Other | Attending: Obstetrics & Gynecology | Admitting: Obstetrics & Gynecology

## 2020-01-02 ENCOUNTER — Other Ambulatory Visit: Payer: Self-pay

## 2020-01-02 DIAGNOSIS — M545 Low back pain, unspecified: Secondary | ICD-10-CM | POA: Diagnosis not present

## 2020-01-02 DIAGNOSIS — O99211 Obesity complicating pregnancy, first trimester: Secondary | ICD-10-CM | POA: Insufficient documentation

## 2020-01-02 DIAGNOSIS — O26891 Other specified pregnancy related conditions, first trimester: Secondary | ICD-10-CM | POA: Diagnosis present

## 2020-01-02 DIAGNOSIS — R109 Unspecified abdominal pain: Secondary | ICD-10-CM

## 2020-01-02 DIAGNOSIS — Z3A09 9 weeks gestation of pregnancy: Secondary | ICD-10-CM | POA: Diagnosis not present

## 2020-01-02 DIAGNOSIS — O99891 Other specified diseases and conditions complicating pregnancy: Secondary | ICD-10-CM | POA: Diagnosis not present

## 2020-01-02 DIAGNOSIS — Z87440 Personal history of urinary (tract) infections: Secondary | ICD-10-CM | POA: Diagnosis not present

## 2020-01-02 DIAGNOSIS — M549 Dorsalgia, unspecified: Secondary | ICD-10-CM

## 2020-01-02 LAB — URINALYSIS, ROUTINE W REFLEX MICROSCOPIC
Bilirubin Urine: NEGATIVE
Glucose, UA: NEGATIVE mg/dL
Hgb urine dipstick: NEGATIVE
Ketones, ur: NEGATIVE mg/dL
Leukocytes,Ua: NEGATIVE
Nitrite: NEGATIVE
Protein, ur: NEGATIVE mg/dL
Specific Gravity, Urine: 1.012 (ref 1.005–1.030)
pH: 6 (ref 5.0–8.0)

## 2020-01-02 MED ORDER — CYCLOBENZAPRINE HCL 10 MG PO TABS: 10.0000 mg | ORAL_TABLET | Freq: Three times a day (TID) | ORAL | 2 refills | Status: DC | PRN

## 2020-01-02 NOTE — Discharge Instructions (Signed)
[facility] [employer] [facility] [employer] at [facility]       Phone: [phone]  [facility] [employer] [facility] [place]   Phone: [phone]  [facility] [employer] [facility] [place]  Phone: [phone]  [facility] [employer] [facility] [employer]  Phone: [phone]  [facility] [employer] [facility] [place]  Phone: [phone]  [facility]   Phone: [phone]  [facility]       Phone: [phone]  [facility]    Phone: [phone]   [facility]    Phone: [phone]  [facility]    Phone: [phone]  [facility]    Phone: [phone]  [facility]       Phone: [phone]   [facility]  Phone: [phone]  [facility]    Phone: [phone]  [facility]   Phone: [phone]  [facility]      Phone: [phone]  [facility]    Phone: [phone]     Abdominal Pain During Pregnancy  Belly (abdominal) pain is common during pregnancy. There are many possible causes. Most of the time, it is not a serious problem. Other times, it can be a sign that something is wrong with the pregnancy. Always tell your doctor if you have belly pain. Follow these instructions at home:  Do not have sex or put anything in your vagina until your pain goes away completely.  Get plenty of rest until your pain gets better.  Drink enough fluid to keep your pee (urine) pale yellow.  Take over-the-counter and prescription medicines only as told by your doctor.  Keep all follow-up visits as told by your doctor. This is important. Contact a doctor if:  Your pain continues or gets worse after resting.  You have lower belly pain that: ? Comes and goes at regular  times. ? Spreads to your back. ? Feels like menstrual cramps.  You have pain or burning when you pee (urinate). Get help right away if:  You have a fever or chills.  You have vaginal bleeding.  You are leaking fluid from your vagina.  You are passing tissue from your vagina.  You throw up (vomit) for more than 24 hours.  You have watery poop (diarrhea) for more than 24 hours.  Your baby is moving less than usual.  You feel very weak or faint.  You have shortness of breath.  You have very bad pain in your upper belly. Summary  Belly (abdominal) pain is common during pregnancy. There are many possible causes.  If you have belly pain during pregnancy, tell your doctor right away.  Keep all follow-up visits as told by your doctor. This is important. This information is not intended to replace advice given to you by your health care provider. Make sure you discuss any questions you have with your health care provider. Document Revised: 06/25/2018 Document Reviewed: 06/09/2016 Elsevier Patient Education  2020 [employer].

## 2020-01-02 NOTE — MAU Note (Signed)
Dr Kelby Fam in Triage with pt to do u/s for pt's reassurance.
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
## 2020-01-03 ENCOUNTER — Other Ambulatory Visit: Payer: Self-pay | Admitting: Obstetrics & Gynecology

## 2020-01-03 DIAGNOSIS — M545 Low back pain, unspecified: Secondary | ICD-10-CM

## 2020-01-03 MED ORDER — CYCLOBENZAPRINE HCL 10 MG PO TABS: 10.0000 mg | ORAL_TABLET | Freq: Three times a day (TID) | ORAL | 2 refills | Status: DC | PRN

## 2020-01-16 ENCOUNTER — Inpatient Hospital Stay (HOSPITAL_COMMUNITY)
Admission: AD | Admit: 2020-01-16 | Discharge: 2020-01-16 | Disposition: A | Discharge status: Home / Self Care | Payer: Medicaid Other | Attending: Obstetrics & Gynecology | Admitting: Obstetrics & Gynecology

## 2020-01-16 ENCOUNTER — Encounter (HOSPITAL_COMMUNITY): Payer: Self-pay | Admitting: Obstetrics & Gynecology

## 2020-01-16 ENCOUNTER — Other Ambulatory Visit: Payer: Self-pay

## 2020-01-16 DIAGNOSIS — O26851 Spotting complicating pregnancy, first trimester: Secondary | ICD-10-CM | POA: Diagnosis present

## 2020-01-16 DIAGNOSIS — M545 Low back pain, unspecified: Secondary | ICD-10-CM | POA: Diagnosis not present

## 2020-01-16 DIAGNOSIS — R102 Pelvic and perineal pain: Secondary | ICD-10-CM | POA: Diagnosis not present

## 2020-01-16 DIAGNOSIS — O26891 Other specified pregnancy related conditions, first trimester: Secondary | ICD-10-CM | POA: Diagnosis not present

## 2020-01-16 DIAGNOSIS — O99891 Other specified diseases and conditions complicating pregnancy: Secondary | ICD-10-CM | POA: Diagnosis not present

## 2020-01-16 DIAGNOSIS — B373 Candidiasis of vulva and vagina: Secondary | ICD-10-CM | POA: Insufficient documentation

## 2020-01-16 DIAGNOSIS — O98811 Other maternal infectious and parasitic diseases complicating pregnancy, first trimester: Secondary | ICD-10-CM | POA: Insufficient documentation

## 2020-01-16 DIAGNOSIS — Z3A11 11 weeks gestation of pregnancy: Secondary | ICD-10-CM | POA: Diagnosis not present

## 2020-01-16 DIAGNOSIS — B3731 Acute candidiasis of vulva and vagina: Secondary | ICD-10-CM

## 2020-01-16 LAB — URINALYSIS, ROUTINE W REFLEX MICROSCOPIC
Bilirubin Urine: NEGATIVE
Glucose, UA: NEGATIVE mg/dL
Hgb urine dipstick: NEGATIVE
Ketones, ur: NEGATIVE mg/dL
Nitrite: NEGATIVE
Protein, ur: NEGATIVE mg/dL
Specific Gravity, Urine: 1.013 (ref 1.005–1.030)
pH: 6 (ref 5.0–8.0)

## 2020-01-16 LAB — WET PREP, GENITAL
Clue Cells Wet Prep HPF POC: NONE SEEN
Sperm: NONE SEEN
Trich, Wet Prep: NONE SEEN
Yeast Wet Prep HPF POC: NONE SEEN

## 2020-01-16 MED ORDER — TERCONAZOLE 0.4 % VA CREA: 1.0000 | TOPICAL_CREAM | Freq: Every day | VAGINAL | 0 refills | Status: DC

## 2020-01-16 MED ORDER — CYCLOBENZAPRINE HCL 10 MG PO TABS: 10.0000 mg | ORAL_TABLET | Freq: Three times a day (TID) | ORAL | 2 refills | Status: DC | PRN

## 2020-01-16 MED ORDER — GLYCOPYRROLATE 1 MG PO TABS
1.0000 mg | ORAL_TABLET | Freq: Three times a day (TID) | ORAL | 0 refills | Status: DC
Start: 2020-01-16 — End: 2020-06-18

## 2020-01-16 NOTE — MAU Provider Note (Signed)
Chief Complaint:  Pelvic cramping, vaginal discharge and scant spotting.   First Provider Initiated Contact with Patient 01/16/20 1538     HPI: [name] is a 24 y.o. [phone] at [redacted]w[redacted]d who presents to maternity admissions reporting increased thick, white vaginal discharge with some mild abdominal cramping and scant spotting. She is also experiencing some vaginal discomfort and itching. She was recently put on flagyl for BV and has a history of chlamydia (with negative TOC). She does not feel like this is similar to when she had chlamydia.  Past Medical History:  Diagnosis Date  . Obesity   . UTI (urinary tract infection)    OB History  Gravida Para Term Preterm AB Living  6 3 3  0 2 3  SAB TAB Ectopic Multiple Live Births  1 1 0 0 3    # Outcome Date GA Lbr Len/2nd Weight Sex Delivery Anes PTL Lv  6 Current           5 Term 09/02/17 [redacted]w[redacted]d 07:55 / 00:02 6 lb 8.4 oz (2.96 kg) M Vag-Spont EPI  LIV  4 Term 08/16/16 [redacted]w[redacted]d 51:20 / 00:01 7 lb 5.8 oz (3.34 kg) M Vag-Spont EPI  LIV  3 Term 03/25/15 [redacted]w[redacted]d 21:07 / 01:59 7 lb 3 oz (3.26 kg) M Vag-Spont EPI, Local  LIV     Birth Comments: wnl and c/w GA with noted molding  2 TAB      TAB     1 SAB             Obstetric Comments  03/12/14 SAB 12 wks   Past Surgical History:  Procedure Laterality Date  . EXTERNAL EAR SURGERY Bilateral 2017   keloid removal  . INDUCED ABORTION    . keloid     removal on ear   Family History  Problem Relation Age of Onset  . Cancer Paternal Grandfather   . Diabetes Father   . Hypertension Father    Social History   Tobacco Use  . Smoking status: Never Smoker  . Smokeless tobacco: Never Used  Vaping Use  . Vaping Use: Never used  Substance Use Topics  . Alcohol use: No  . Drug use: No   No Known Allergies Medications Prior to Admission  Medication Sig Dispense Refill Last Dose  . metroNIDAZOLE (FLAGYL) 500 MG tablet Take 1 tablet (500 mg total) by mouth 2 (two) times daily. 14 tablet 0 Past  Week at Unknown time  . ondansetron (ZOFRAN ODT) 4 MG disintegrating tablet Take 1 tablet (4 mg total) by mouth every 8 (eight) hours as needed for nausea or vomiting. 30 tablet 2 Past Week at Unknown time  . [DISCONTINUED] cyclobenzaprine (FLEXERIL) 10 MG tablet Take 1 tablet (10 mg total) by mouth 3 (three) times daily as needed for muscle spasms. 30 tablet 2 Past Week at Unknown time  . [DISCONTINUED] glycopyrrolate (ROBINUL) 1 MG tablet Take 1 tablet (1 mg total) by mouth 3 (three) times daily. 90 tablet 0 Past Week at Unknown time    I have reviewed patient's Past Medical Hx, Surgical Hx, Family Hx, Social Hx, medications and allergies.   ROS:  Review of Systems  Constitutional: Negative for fatigue.  HENT: Negative for congestion.   Respiratory: Negative for cough and shortness of breath.   Gastrointestinal: Negative for nausea.       No nausea/vomiting, is having a lot of spitting  Genitourinary: Positive for pelvic pain, vaginal bleeding, vaginal discharge and vaginal pain (pressure and itching).  Neurological: Negative for dizziness, syncope and headaches.  All other systems reviewed and are negative.   Physical Exam   Patient Vitals for the past 24 hrs:  BP Temp Temp src Pulse Resp SpO2 Height Weight  01/16/20 1458 (!) 118/54 98.8 F (37.1 C) Oral 99 18 98 % 5\' 2"  (1.575 m) 253 lb 1.6 oz (114.8 kg)    Constitutional: Well-developed, well-nourished female in no acute distress.  Cardiovascular: normal rate & rhythm, no murmur Respiratory: normal effort, lung sounds clear throughout GI: Abd soft, non-tender, gravid appropriate for gestational age. Pos BS x 4 MS: Extremities nontender, no edema, normal ROM Neurologic: Alert and oriented x 4.  Pelvic: NEFG, copious pale yellow-green, chunky discharge, no blood noted     Labs: Results for orders placed or performed during the hospital encounter of 01/16/20 (from the past 24 hour(s))  Urinalysis, Routine w reflex microscopic  Urine, Clean Catch     Status: Abnormal   Collection Time: 01/16/20  3:28 PM  Result Value Ref Range   Color, Urine YELLOW YELLOW   APPearance HAZY (A) CLEAR   Specific Gravity, Urine 1.013 1.005 - 1.030   pH 6.0 5.0 - 8.0   Glucose, UA NEGATIVE NEGATIVE mg/dL   Hgb urine dipstick NEGATIVE NEGATIVE   Bilirubin Urine NEGATIVE NEGATIVE   Ketones, ur NEGATIVE NEGATIVE mg/dL   Protein, ur NEGATIVE NEGATIVE mg/dL   Nitrite NEGATIVE NEGATIVE   Leukocytes,Ua LARGE (A) NEGATIVE   RBC / HPF 0-5 0 - 5 RBC/hpf   WBC, UA 11-20 0 - 5 WBC/hpf   Bacteria, UA RARE (A) NONE SEEN   Squamous Epithelial / LPF 0-5 0 - 5  Wet prep, genital     Status: Abnormal   Collection Time: 01/16/20  3:49 PM   Specimen: PATH Cytology Cervicovaginal Ancillary Only  Result Value Ref Range   Yeast Wet Prep HPF POC NONE SEEN NONE SEEN   Trich, Wet Prep NONE SEEN NONE SEEN   Clue Cells Wet Prep HPF POC NONE SEEN NONE SEEN   WBC, Wet Prep HPF POC MANY (A) NONE SEEN   Sperm NONE SEEN     Imaging:  No results found.  MAU Course: Orders Placed This Encounter  Procedures  . Wet prep, genital  . Culture, OB Urine  . Urinalysis, Routine w reflex microscopic Urine, Clean Catch  . Discharge patient   Meds ordered this encounter  Medications  . glycopyrrolate (ROBINUL) 1 MG tablet    Sig: Take 1 tablet (1 mg total) by mouth 3 (three) times daily.    Dispense:  90 tablet    Refill:  0    Order Specific Question:   Supervising Provider    Answer:   01/18/20 [[id number]]  . cyclobenzaprine (FLEXERIL) 10 MG tablet    Sig: Take 1 tablet (10 mg total) by mouth 3 (three) times daily as needed for muscle spasms.    Dispense:  30 tablet    Refill:  2    Order Specific Question:   Supervising Provider    Answer:   Reva Bores [[id number]]  . terconazole (TERAZOL 7) 0.4 % vaginal cream    Sig: Place 1 applicator vaginally at bedtime. Use for seven days    Dispense:  45 g    Refill:  0    Order Specific Question:    Supervising Provider    Answer:   Reva Bores    MDM:  Assessment: 1. Vaginal candida  2. [redacted] weeks gestation of pregnancy   3. Acute midline low back pain without sciatica     Plan: Discharge home in stable condition with return precautions.  Will follow up with gc/ct results tomorrow if needed. Terazol and robinul sent to pharmacy   Follow-up Information    [facility] [employer] at [facility] .   Specialty: Obstetrics and Gynecology Contact information: [address] [place] [address] [phone]              Allergies as of 01/16/2020   No Known Allergies     Medication List    STOP taking these medications   metroNIDAZOLE 500 MG tablet Commonly known as: FLAGYL   ondansetron 4 MG disintegrating tablet Commonly known as: Zofran ODT     TAKE these medications   cyclobenzaprine 10 MG tablet Commonly known as: FLEXERIL Take 1 tablet (10 mg total) by mouth 3 (three) times daily as needed for muscle spasms.   glycopyrrolate 1 MG tablet Commonly known as: Robinul Take 1 tablet (1 mg total) by mouth 3 (three) times daily.   terconazole 0.4 % vaginal cream Commonly known as: TERAZOL 7 Place 1 applicator vaginally at bedtime. Use for seven days      Edd Arbour, CNM, MSN, [facility] 01/16/20 4:50 PM

## 2020-01-16 NOTE — MAU Note (Signed)
Presents with c/o vaginal discharge and spotting and abdominal cramping.  Took Tylenol for cramping this morning @ 0800.

## 2020-01-16 NOTE — Discharge Instructions (Signed)
Vaginal Yeast Infection, Adult  Vaginal yeast infection is a condition that causes vaginal discharge as well as soreness, swelling, and redness (inflammation) of the vagina. This is a common condition. Some women get this infection frequently. What are the causes? This condition is caused by a change in the normal balance of the yeast (candida) and bacteria that live in the vagina. This change causes an overgrowth of yeast, which causes the inflammation. What increases the risk? The condition is more likely to develop in women who:  Take antibiotic medicines.  Have diabetes.  Take birth control pills.  Are pregnant.  Douche often.  Have a weak body defense system (immune system).  Have been taking steroid medicines for a long time.  Frequently wear tight clothing. What are the signs or symptoms? Symptoms of this condition include:  White, thick, creamy vaginal discharge.  Swelling, itching, redness, and irritation of the vagina. The lips of the vagina (vulva) may be affected as well.  Pain or a burning feeling while urinating.  Pain during sex. How is this diagnosed? This condition is diagnosed based on:  Your medical history.  A physical exam.  A pelvic exam. Your health care provider will examine a sample of your vaginal discharge under a microscope. Your health care provider may send this sample for testing to confirm the diagnosis. How is this treated? This condition is treated with medicine. Medicines may be over-the-counter or prescription. You may be told to use one or more of the following:  Medicine that is taken by mouth (orally).  Medicine that is applied as a cream (topically).  Medicine that is inserted directly into the vagina (suppository). Follow these instructions at home:  Lifestyle  Do not have sex until your health care provider approves. Tell your sex partner that you have a yeast infection. That person should go to his or her health care  provider and ask if they should also be treated.  Do not wear tight clothes, such as pantyhose or tight pants.  Wear breathable cotton underwear. General instructions  Take or apply over-the-counter and prescription medicines only as told by your health care provider.  Eat more yogurt. This may help to keep your yeast infection from returning.  Do not use tampons until your health care provider approves.  Try taking a sitz bath to help with discomfort. This is a warm water bath that is taken while you are sitting down. The water should only come up to your hips and should cover your buttocks. Do this 3-4 times per day or as told by your health care provider.  Do not douche.  If you have diabetes, keep your blood sugar levels under control.  Keep all follow-up visits as told by your health care provider. This is important. Contact a health care provider if:  You have a fever.  Your symptoms go away and then return.  Your symptoms do not get better with treatment.  Your symptoms get worse.  You have new symptoms.  You develop blisters in or around your vagina.  You have blood coming from your vagina and it is not your menstrual period.  You develop pain in your abdomen. Summary  Vaginal yeast infection is a condition that causes discharge as well as soreness, swelling, and redness (inflammation) of the vagina.  This condition is treated with medicine. Medicines may be over-the-counter or prescription.  Take or apply over-the-counter and prescription medicines only as told by your health care provider.  Do not douche.   Do not have sex or use tampons until your health care provider approves.  Contact a health care provider if your symptoms do not get better with treatment or your symptoms go away and then return. This information is not intended to replace advice given to you by your health care provider. Make sure you discuss any questions you have with your health care  provider. Document Revised: 10/05/2018 Document Reviewed: 07/24/2017 Elsevier Patient Education  2020 [employer]  

## 2020-01-17 LAB — GC/CHLAMYDIA PROBE AMP (~~LOC~~) NOT AT ARMC
Chlamydia: NEGATIVE
Comment: NEGATIVE
Comment: NORMAL
Neisseria Gonorrhea: NEGATIVE

## 2020-01-17 LAB — CULTURE, OB URINE: Culture: 20000 — AB

## 2020-01-18 ENCOUNTER — Other Ambulatory Visit: Payer: Self-pay

## 2020-01-18 DIAGNOSIS — R8271 Bacteriuria: Secondary | ICD-10-CM

## 2020-01-18 HISTORY — DX: Bacteriuria: R82.71

## 2020-01-18 MED ORDER — CEPHALEXIN 500 MG PO CAPS: 500.0000 mg | ORAL_CAPSULE | Freq: Three times a day (TID) | ORAL | 0 refills | Status: DC

## 2020-01-18 NOTE — Progress Notes (Signed)
Patient notified of GBS bacteruria and RX sent to pharmacy.   Rolm Bookbinder, CNM 01/18/20 8:56 AM

## 2020-02-08 DIAGNOSIS — A6004 Herpesviral vulvovaginitis: Secondary | ICD-10-CM | POA: Insufficient documentation

## 2020-02-08 DIAGNOSIS — N719 Inflammatory disease of uterus, unspecified: Secondary | ICD-10-CM

## 2020-02-08 HISTORY — DX: Inflammatory disease of uterus, unspecified: N71.9

## 2020-02-10 DIAGNOSIS — Z9889 Other specified postprocedural states: Secondary | ICD-10-CM | POA: Insufficient documentation

## 2020-06-18 ENCOUNTER — Other Ambulatory Visit: Payer: Self-pay

## 2020-06-18 ENCOUNTER — Encounter (HOSPITAL_COMMUNITY): Payer: Self-pay | Admitting: Obstetrics and Gynecology

## 2020-06-18 ENCOUNTER — Inpatient Hospital Stay (HOSPITAL_COMMUNITY)
Admission: AD | Admit: 2020-06-18 | Discharge: 2020-06-18 | Disposition: A | Discharge status: Home / Self Care | Payer: Medicaid Other | Attending: Obstetrics and Gynecology | Admitting: Obstetrics and Gynecology

## 2020-06-18 ENCOUNTER — Inpatient Hospital Stay (HOSPITAL_COMMUNITY): Payer: Medicaid Other

## 2020-06-18 DIAGNOSIS — O99611 Diseases of the digestive system complicating pregnancy, first trimester: Secondary | ICD-10-CM | POA: Diagnosis not present

## 2020-06-18 DIAGNOSIS — B9689 Other specified bacterial agents as the cause of diseases classified elsewhere: Secondary | ICD-10-CM | POA: Diagnosis not present

## 2020-06-18 DIAGNOSIS — Z3A08 8 weeks gestation of pregnancy: Secondary | ICD-10-CM

## 2020-06-18 DIAGNOSIS — R109 Unspecified abdominal pain: Secondary | ICD-10-CM | POA: Diagnosis not present

## 2020-06-18 DIAGNOSIS — O210 Mild hyperemesis gravidarum: Secondary | ICD-10-CM | POA: Insufficient documentation

## 2020-06-18 DIAGNOSIS — R1084 Generalized abdominal pain: Secondary | ICD-10-CM | POA: Insufficient documentation

## 2020-06-18 DIAGNOSIS — R12 Heartburn: Secondary | ICD-10-CM | POA: Diagnosis not present

## 2020-06-18 DIAGNOSIS — O219 Vomiting of pregnancy, unspecified: Secondary | ICD-10-CM

## 2020-06-18 DIAGNOSIS — K219 Gastro-esophageal reflux disease without esophagitis: Secondary | ICD-10-CM

## 2020-06-18 DIAGNOSIS — O23591 Infection of other part of genital tract in pregnancy, first trimester: Secondary | ICD-10-CM | POA: Diagnosis not present

## 2020-06-18 DIAGNOSIS — K117 Disturbances of salivary secretion: Secondary | ICD-10-CM | POA: Diagnosis not present

## 2020-06-18 DIAGNOSIS — O26891 Other specified pregnancy related conditions, first trimester: Secondary | ICD-10-CM

## 2020-06-18 DIAGNOSIS — Z3491 Encounter for supervision of normal pregnancy, unspecified, first trimester: Secondary | ICD-10-CM

## 2020-06-18 DIAGNOSIS — N760 Acute vaginitis: Secondary | ICD-10-CM

## 2020-06-18 DIAGNOSIS — O26899 Other specified pregnancy related conditions, unspecified trimester: Secondary | ICD-10-CM

## 2020-06-18 LAB — HIV ANTIBODY (ROUTINE TESTING W REFLEX): HIV Screen 4th Generation wRfx: NONREACTIVE

## 2020-06-18 LAB — CBC WITH DIFFERENTIAL/PLATELET
Abs Immature Granulocytes: 0.03 10*3/uL (ref 0.00–0.07)
Basophils Absolute: 0 10*3/uL (ref 0.0–0.1)
Basophils Relative: 0 %
Eosinophils Absolute: 0.2 10*3/uL (ref 0.0–0.5)
Eosinophils Relative: 1 %
HCT: 35.5 % — ABNORMAL LOW (ref 36.0–46.0)
Hemoglobin: 12.2 g/dL (ref 12.0–15.0)
Immature Granulocytes: 0 %
Lymphocytes Relative: 21 %
Lymphs Abs: 2.3 10*3/uL (ref 0.7–4.0)
MCH: 27.8 pg (ref 26.0–34.0)
MCHC: 34.4 g/dL (ref 30.0–36.0)
MCV: 80.9 fL (ref 80.0–100.0)
Monocytes Absolute: 0.6 10*3/uL (ref 0.1–1.0)
Monocytes Relative: 5 %
Neutro Abs: 8 10*3/uL — ABNORMAL HIGH (ref 1.7–7.7)
Neutrophils Relative %: 73 %
Platelets: 252 10*3/uL (ref 150–400)
RBC: 4.39 MIL/uL (ref 3.87–5.11)
RDW: 13.3 % (ref 11.5–15.5)
WBC: 11.1 10*3/uL — ABNORMAL HIGH (ref 4.0–10.5)
nRBC: 0 % (ref 0.0–0.2)

## 2020-06-18 LAB — WET PREP, GENITAL
Sperm: NONE SEEN
Trich, Wet Prep: NONE SEEN
Yeast Wet Prep HPF POC: NONE SEEN

## 2020-06-18 LAB — URINALYSIS, ROUTINE W REFLEX MICROSCOPIC
Bilirubin Urine: NEGATIVE
Glucose, UA: NEGATIVE mg/dL
Hgb urine dipstick: NEGATIVE
Ketones, ur: NEGATIVE mg/dL
Nitrite: NEGATIVE
Protein, ur: NEGATIVE mg/dL
Specific Gravity, Urine: 1.015 (ref 1.005–1.030)
pH: 5 (ref 5.0–8.0)

## 2020-06-18 LAB — COMPREHENSIVE METABOLIC PANEL
ALT: 16 U/L (ref 0–44)
AST: 18 U/L (ref 15–41)
Albumin: 3.2 g/dL — ABNORMAL LOW (ref 3.5–5.0)
Alkaline Phosphatase: 82 U/L (ref 38–126)
Anion gap: 7 (ref 5–15)
BUN: 5 mg/dL — ABNORMAL LOW (ref 6–20)
CO2: 25 mmol/L (ref 22–32)
Calcium: 9.1 mg/dL (ref 8.9–10.3)
Chloride: 102 mmol/L (ref 98–111)
Creatinine, Ser: 0.89 mg/dL (ref 0.44–1.00)
GFR, Estimated: >60 mL/min (ref >60)
Glucose, Bld: 83 mg/dL (ref 70–99)
Potassium: 3.7 mmol/L (ref 3.5–5.1)
Sodium: 134 mmol/L — ABNORMAL LOW (ref 135–145)
Total Bilirubin: 0.1 mg/dL — ABNORMAL LOW (ref 0.3–1.2)
Total Protein: 7.2 g/dL (ref 6.5–8.1)

## 2020-06-18 LAB — ABO/RH: ABO/RH(D): B POS

## 2020-06-18 LAB — POCT PREGNANCY, URINE: Preg Test, Ur: POSITIVE — AB

## 2020-06-18 LAB — HCG, QUANTITATIVE, PREGNANCY: hCG, Beta Chain, Quant, S: 167976 m[IU]/mL — ABNORMAL HIGH (ref <5)

## 2020-06-18 MED ORDER — METRONIDAZOLE 0.75 % VA GEL: 1.0000 | Freq: Two times a day (BID) | VAGINAL | 0 refills | Status: DC

## 2020-06-18 MED ORDER — PROMETHAZINE HCL 25 MG PO TABS: 25.0000 mg | ORAL_TABLET | Freq: Four times a day (QID) | ORAL | 0 refills | Status: DC | PRN

## 2020-06-18 MED ORDER — FAMOTIDINE 20 MG PO TABS
40.0000 mg | ORAL_TABLET | Freq: Once | ORAL | Status: AC
  Administered 2020-06-18: 40 mg via ORAL
  Filled 2020-06-18: qty 2

## 2020-06-18 MED ORDER — FAMOTIDINE 20 MG PO TABS: 20.0000 mg | ORAL_TABLET | Freq: Two times a day (BID) | ORAL | 0 refills | Status: DC

## 2020-06-18 MED ORDER — GLYCOPYRROLATE 1 MG PO TABS: 1.0000 mg | ORAL_TABLET | Freq: Three times a day (TID) | ORAL | 0 refills | Status: DC

## 2020-06-18 NOTE — MAU Note (Signed)
Presents with c/o lower abdominal pain that began yesterday, states pain feels as if she's being stabbed.  Denies VB.  LMP 04/22/2020.  Reports +HPT.

## 2020-06-18 NOTE — MAU Provider Note (Addendum)
History     CSN: [id number]  Arrival date and time: 06/18/20 1814   Event Date/Time   First Provider Initiated Contact with Patient 06/18/20 1926      Chief Complaint  Patient presents with  . Abdominal Pain  . Chills   HPI  Beverly Anthony is a 25 y.o. female [id number] @ [redacted]w[redacted]d here with 2-3 days of generalized abdominal pain. She reports the pain became worse today. The pain in her upper abdomen is constant and feels like a burning feeling. Rates pain 8/10. Hasn't treated symptoms. The lower abdominal pain comes and goes. The pain is sharp and stabbing. She has vomited 2-3 times per day. She tried chicken and rice soup, no fried foods or spicy foods. 2 days ago she did eat spaghetti and a hot dog.   OB History    Gravida  6   Para  3   Term  3   Preterm  0   AB  2   Living  3     SAB  1   IAB  1   Ectopic  0   Multiple  0   Live Births  3           Past Medical History:  Diagnosis Date  . Obesity   . UTI (urinary tract infection)     Past Surgical History:  Procedure Laterality Date  . EXTERNAL EAR SURGERY Bilateral 2017   keloid removal  . INDUCED ABORTION    . keloid     removal on ear    Family History  Problem Relation Age of Onset  . Cancer Paternal Grandfather   . Diabetes Father   . Hypertension Father     Social History   Tobacco Use  . Smoking status: Never Smoker  . Smokeless tobacco: Never Used  Vaping Use  . Vaping Use: Never used  Substance Use Topics  . Alcohol use: No  . Drug use: No    Allergies: No Known Allergies  Medications Prior to Admission  Medication Sig Dispense Refill Last Dose  . cephALEXin (KEFLEX) 500 MG capsule Take 1 capsule (500 mg total) by mouth 3 (three) times daily. 21 capsule 0   . cyclobenzaprine (FLEXERIL) 10 MG tablet Take 1 tablet (10 mg total) by mouth 3 (three) times daily as needed for muscle spasms. 30 tablet 2   . glycopyrrolate (ROBINUL) 1 MG tablet Take 1 tablet (1 mg total) by  mouth 3 (three) times daily. 90 tablet 0   . terconazole (TERAZOL 7) 0.4 % vaginal cream Place 1 applicator vaginally at bedtime. Use for seven days 45 g 0    Results for orders placed or performed during the hospital encounter of 06/18/20 (from the past 48 hour(s))  Pregnancy, urine POC     Status: Abnormal   Collection Time: 06/18/20  6:42 PM  Result Value Ref Range   Preg Test, Ur POSITIVE (A) NEGATIVE    Comment:        THE SENSITIVITY OF THIS METHODOLOGY IS >24 mIU/mL   Urinalysis, Routine w reflex microscopic Urine, Clean Catch     Status: Abnormal   Collection Time: 06/18/20  7:30 PM  Result Value Ref Range   Color, Urine YELLOW YELLOW   APPearance HAZY (A) CLEAR   Specific Gravity, Urine 1.015 1.005 - 1.030   pH 5.0 5.0 - 8.0   Glucose, UA NEGATIVE NEGATIVE mg/dL   Hgb urine dipstick NEGATIVE NEGATIVE   Bilirubin Urine NEGATIVE NEGATIVE  Ketones, ur NEGATIVE NEGATIVE mg/dL   Protein, ur NEGATIVE NEGATIVE mg/dL   Nitrite NEGATIVE NEGATIVE   Leukocytes,Ua MODERATE (A) NEGATIVE   RBC / HPF 0-5 0 - 5 RBC/hpf   WBC, UA 6-10 0 - 5 WBC/hpf   Bacteria, UA RARE (A) NONE SEEN   Squamous Epithelial / LPF 11-20 0 - 5   Mucus PRESENT     Comment: Performed at [facility], [address], [place], [place] [address]  Wet prep, genital     Status: Abnormal   Collection Time: 06/18/20  8:03 PM   Specimen: Vaginal  Result Value Ref Range   Yeast Wet Prep HPF POC NONE SEEN NONE SEEN   Trich, Wet Prep NONE SEEN NONE SEEN   Clue Cells Wet Prep HPF POC PRESENT (A) NONE SEEN   WBC, Wet Prep HPF POC MANY (A) NONE SEEN   Sperm NONE SEEN     Comment: Performed at [facility], [address] [place], [place] [address]  CBC with Differential/Platelet     Status: Abnormal   Collection Time: 06/18/20  8:22 PM  Result Value Ref Range   WBC 11.1 (H) 4.0 - 10.5 K/uL   RBC 4.39 3.87 - 5.11 MIL/uL   Hemoglobin 12.2 12.0 - 15.0 g/dL   HCT 82.9 (L) 56.2 - 13.0 %   MCV 80.9 80.0 -  100.0 fL   MCH 27.8 26.0 - 34.0 pg   MCHC 34.4 30.0 - 36.0 g/dL   RDW 86.5 78.4 - 69.6 %   Platelets 252 150 - 400 K/uL   nRBC 0.0 0.0 - 0.2 %   Neutrophils Relative % 73 %   Neutro Abs 8.0 (H) 1.7 - 7.7 K/uL   Lymphocytes Relative 21 %   Lymphs Abs 2.3 0.7 - 4.0 K/uL   Monocytes Relative 5 %   Monocytes Absolute 0.6 0.1 - 1.0 K/uL   Eosinophils Relative 1 %   Eosinophils Absolute 0.2 0.0 - 0.5 K/uL   Basophils Relative 0 %   Basophils Absolute 0.0 0.0 - 0.1 K/uL   Immature Granulocytes 0 %   Abs Immature Granulocytes 0.03 0.00 - 0.07 K/uL    Comment: Performed at [facility], [address] [place], [place] [address]  Comprehensive metabolic panel     Status: Abnormal   Collection Time: 06/18/20  8:22 PM  Result Value Ref Range   Sodium 134 (L) 135 - 145 mmol/L   Potassium 3.7 3.5 - 5.1 mmol/L   Chloride 102 98 - 111 mmol/L   CO2 25 22 - 32 mmol/L   Glucose, Bld 83 70 - 99 mg/dL    Comment: Glucose reference range applies only to samples taken after fasting for at least 8 hours.   BUN 5 (L) 6 - 20 mg/dL   Creatinine, Ser 4.13 0.44 - 1.00 mg/dL   Calcium 9.1 8.9 - 24.4 mg/dL   Total Protein 7.2 6.5 - 8.1 g/dL   Albumin 3.2 (L) 3.5 - 5.0 g/dL   AST 18 15 - 41 U/L   ALT 16 0 - 44 U/L   Alkaline Phosphatase 82 38 - 126 U/L   Total Bilirubin 0.1 (L) 0.3 - 1.2 mg/dL   GFR, Estimated >01 >02 mL/min    Comment: (NOTE) Calculated using the CKD-EPI Creatinine Equation (2021)    Anion gap 7 5 - 15    Comment: Performed at [facility], [address], [place], [place] [address]  ABO/Rh     Status: None   Collection Time: 06/18/20  8:22 PM  Result Value Ref Range   ABO/RH(D) B POS    No rh immune globuloin      NOT A RH IMMUNE GLOBULIN CANDIDATE, PT RH POSITIVE Performed at [facility], [address], [place], [place] [address]     Review of Systems  Constitutional: Positive for chills. Negative for fever.  Gastrointestinal: Positive for abdominal  pain, nausea and vomiting.  Genitourinary: Positive for vaginal discharge. Negative for vaginal bleeding.   Physical Exam   Blood pressure 126/72, pulse (!) 106, temperature 98.1 F (36.7 C), temperature source Oral, resp. rate 20, height 5\' 2"  (1.575 m), weight 108.8 kg, last menstrual period 04/22/2020, SpO2 100 %, unknown if currently breastfeeding.  Physical Exam Constitutional:      General: She is not in acute distress.    Appearance: She is well-developed. She is not ill-appearing, toxic-appearing or diaphoretic.  Abdominal:     Tenderness: There is generalized abdominal tenderness and tenderness in the epigastric area and suprapubic area. There is no guarding or rebound.  Skin:    General: Skin is warm.  Neurological:     Mental Status: She is alert and oriented to person, place, and time.  Psychiatric:        Behavior: Behavior normal.    MAU Course  Procedures  06/20/2020 OB Comp Less 14 Wks  Result Date: 06/18/2020 CLINICAL DATA:  Pain for 1 day, no vaginal bleeding EXAM: OBSTETRIC <14 WK ULTRASOUND TECHNIQUE: Transabdominal ultrasound was performed for evaluation of the gestation as well as the maternal uterus and adnexal regions. COMPARISON:  Obstetrical ultrasound from prior gestation 02/08/2020 FINDINGS: Intrauterine gestational sac: Single Yolk sac:  Visualized. Embryo:  Visualized. Cardiac Activity: Visualized. Heart Rate: 176 bpm CRL:   19.5 mm   8 w 3 d                  02/10/2020 EDC: 01/25/2021 Subchorionic hemorrhage:  None visualized. Maternal uterus/adnexae: Anteverted uterus. No acute or worrisome uterine abnormalities. Normal appearance of the ovaries. No free fluid. IMPRESSION: Single viable intrauterine gestation at 8 weeks, 3 days by crown-rump length sonographic estimation. No acute sonographic complication. Electronically Signed   By: 13/09/2020 M.D.   On: 06/18/2020 20:53   Results for orders placed or performed during the hospital encounter of 06/18/20 (from the past 24  hour(s))  Pregnancy, urine POC     Status: Abnormal   Collection Time: 06/18/20  6:42 PM  Result Value Ref Range   Preg Test, Ur POSITIVE (A) NEGATIVE  Urinalysis, Routine w reflex microscopic Urine, Clean Catch     Status: Abnormal   Collection Time: 06/18/20  7:30 PM  Result Value Ref Range   Color, Urine YELLOW YELLOW   APPearance HAZY (A) CLEAR   Specific Gravity, Urine 1.015 1.005 - 1.030   pH 5.0 5.0 - 8.0   Glucose, UA NEGATIVE NEGATIVE mg/dL   Hgb urine dipstick NEGATIVE NEGATIVE   Bilirubin Urine NEGATIVE NEGATIVE   Ketones, ur NEGATIVE NEGATIVE mg/dL   Protein, ur NEGATIVE NEGATIVE mg/dL   Nitrite NEGATIVE NEGATIVE   Leukocytes,Ua MODERATE (A) NEGATIVE   RBC / HPF 0-5 0 - 5 RBC/hpf   WBC, UA 6-10 0 - 5 WBC/hpf   Bacteria, UA RARE (A) NONE SEEN   Squamous Epithelial / LPF 11-20 0 - 5   Mucus PRESENT   Wet prep, genital  Status: Abnormal   Collection Time: 06/18/20  8:03 PM   Specimen: Vaginal  Result Value Ref Range   Yeast Wet Prep HPF POC NONE SEEN NONE SEEN   Trich, Wet Prep NONE SEEN NONE SEEN   Clue Cells Wet Prep HPF POC PRESENT (A) NONE SEEN   WBC, Wet Prep HPF POC MANY (A) NONE SEEN   Sperm NONE SEEN   CBC with Differential/Platelet     Status: Abnormal   Collection Time: 06/18/20  8:22 PM  Result Value Ref Range   WBC 11.1 (H) 4.0 - 10.5 K/uL   RBC 4.39 3.87 - 5.11 MIL/uL   Hemoglobin 12.2 12.0 - 15.0 g/dL   HCT 09.8 (L) 11.9 - 14.7 %   MCV 80.9 80.0 - 100.0 fL   MCH 27.8 26.0 - 34.0 pg   MCHC 34.4 30.0 - 36.0 g/dL   RDW 82.9 56.2 - 13.0 %   Platelets 252 150 - 400 K/uL   nRBC 0.0 0.0 - 0.2 %   Neutrophils Relative % 73 %   Neutro Abs 8.0 (H) 1.7 - 7.7 K/uL   Lymphocytes Relative 21 %   Lymphs Abs 2.3 0.7 - 4.0 K/uL   Monocytes Relative 5 %   Monocytes Absolute 0.6 0.1 - 1.0 K/uL   Eosinophils Relative 1 %   Eosinophils Absolute 0.2 0.0 - 0.5 K/uL   Basophils Relative 0 %   Basophils Absolute 0.0 0.0 - 0.1 K/uL   Immature Granulocytes 0 %    Abs Immature Granulocytes 0.03 0.00 - 0.07 K/uL  Comprehensive metabolic panel     Status: Abnormal   Collection Time: 06/18/20  8:22 PM  Result Value Ref Range   Sodium 134 (L) 135 - 145 mmol/L   Potassium 3.7 3.5 - 5.1 mmol/L   Chloride 102 98 - 111 mmol/L   CO2 25 22 - 32 mmol/L   Glucose, Bld 83 70 - 99 mg/dL   BUN 5 (L) 6 - 20 mg/dL   Creatinine, Ser 8.65 0.44 - 1.00 mg/dL   Calcium 9.1 8.9 - 78.4 mg/dL   Total Protein 7.2 6.5 - 8.1 g/dL   Albumin 3.2 (L) 3.5 - 5.0 g/dL   AST 18 15 - 41 U/L   ALT 16 0 - 44 U/L   Alkaline Phosphatase 82 38 - 126 U/L   Total Bilirubin 0.1 (L) 0.3 - 1.2 mg/dL   GFR, Estimated >69 >62 mL/min   Anion gap 7 5 - 15  ABO/Rh     Status: None   Collection Time: 06/18/20  8:22 PM  Result Value Ref Range   ABO/RH(D) B POS    No rh immune globuloin      NOT A RH IMMUNE GLOBULIN CANDIDATE, PT RH POSITIVE Performed at [facility], [address] [place], [place] [address]      MDM  Pepcid 2 tablets.  Wet prep & GC- collected by RN.  HIV, CBC, Hcg, ABO US OB transvaginal  B positive blood type Report given to Judeth Horn NP who resumes care of the patient.    Ultrasound shows live IUP Labs reassuring Epigastric pain improved with pepcid - pt wants rx robinul, pepcid, & phenergan  Wet prep + clue cells, will rx metrogel due to n/v Assessment and Plan   A:  1. Normal IUP (intrauterine pregnancy) on prenatal ultrasound, first trimester   2. Abdominal pain affecting pregnancy   3. Heartburn during pregnancy in first trimester   4. Nausea  and vomiting during pregnancy prior to [redacted] weeks gestation   5. BV (bacterial vaginosis)   6. Sialorrhea in antepartum period in first trimester   7. [redacted] weeks gestation of pregnancy       P: Discharge home Rx phenergan, robinul, metrogel, & pepcid Start prenatal care GC/CT pending   Judeth HornLawrence, Arrianna Catala, NP

## 2020-06-18 NOTE — Discharge Instructions (Signed)
[facility] [employer]Providers          Center for [facility]  [address], [place], [place] [address]  [phone]  [facility] for [facility]  [address], [place], [place] [address]  [phone]  [facility] for [facility]  [address], [place], [place] [address]  [phone]  Center for [facility] [place]  [address], [place], [place] [address]  [phone]  [facility] for [facility]  [address]First floor), [place], [place] [address]  [phone]  Center for [facility] [address], [place], [place] [address] [phone]  Center for [facility]  [address] [place], [place], [place] [address]  [phone]  [facility]  [address], [place], [place] [address]  [phone]  [facility]  [address], [place], [place] [address]  [phone]  [facility]  [address], [place], [place] [address]  [phone]  [facility]  [address], [place], [place] [address]  [phone]  [facility]  [address], [place], [place] [address]  [phone]  [facility]   [address] [place], [place], [place] [address]  [phone]  [facility]  [address], [place], [place] [address]   [phone]  [facility]  [address], [place], [place] [address]  [phone]      Morning Sickness  Morning sickness is when a woman feels nauseous during pregnancy. This nauseous feeling may or may not come with vomiting. It often occurs in the morning, but it can be a problem at any time of day. Morning sickness is most common during the first trimester. In some cases, it may continue throughout pregnancy. Although morning sickness is unpleasant, it is usually harmless unless the woman develops severe  and continual vomiting (hyperemesis gravidarum), a condition that requires more intense treatment. What are the causes? The exact cause of this condition is not known, but it seems to be related to normal hormonal changes that occur in pregnancy. What increases the risk? You are more likely to develop this condition if:  You experienced nausea or vomiting before your pregnancy.  You had morning sickness during a previous pregnancy.  You are pregnant with more than one baby, such as twins. What are the signs or symptoms? Symptoms of this condition include:  Nausea.  Vomiting. How is this diagnosed? This condition is usually diagnosed based on your signs and symptoms. How is this treated? In many cases, treatment is not needed for this condition. Making some changes to what you eat may help to control symptoms. Your health care provider may also prescribe or recommend:  Vitamin B6 supplements.  Anti-nausea medicines.  Ginger. Follow these instructions at home: Medicines  Take over-the-counter and prescription medicines only as told by your health care provider. Do not use any prescription, over-the-counter, or herbal medicines for morning sickness without first talking with your health care provider.  Take multivitamins before getting pregnant. This can prevent or decrease the severity of morning sickness in most women. Eating and drinking  Eat a piece of dry toast or crackers before getting out of bed in the morning.  Eat 5 or 6 small meals a day.  Eat dry and bland foods, such as rice or a baked potato. Foods that are high in  carbohydrates are often helpful.  Avoid greasy, fatty, and spicy foods.  Have someone cook for you if the smell of any food causes nausea and vomiting.  If you feel nauseous after taking prenatal vitamins, take the vitamins at night or with a snack.  Eat a protein snack between meals if you are hungry. Nuts, yogurt, and cheese are good  options.  Drink fluids throughout the day.  Try ginger ale made with real ginger, ginger tea made from fresh grated ginger, or ginger candies. General instructions  Do not use any products that contain nicotine or tobacco. These products include cigarettes, chewing tobacco, and vaping devices, such as e-cigarettes. If you need help quitting, ask your health care provider.  Get an air purifier to keep the air in your house free of odors.  Get plenty of fresh air.  Try to avoid odors that trigger your nausea.  Consider trying these methods to help relieve symptoms: ? Wearing an acupressure wristband. These wristbands are often worn for seasickness. ? Acupuncture. Contact a health care provider if:  Your home remedies are not working and you need medicine.  You feel dizzy or light-headed.  You are losing weight. Get help right away if:  You have persistent and uncontrolled nausea and vomiting.  You faint.  You have severe pain in your abdomen. Summary  Morning sickness is when a woman feels nauseous during pregnancy. This nauseous feeling may or may not come with vomiting.  Morning sickness is most common during the first trimester.  It often occurs in the morning, but it can be a problem at any time of day.  In many cases, treatment is not needed for this condition. Making some changes to what you eat may help to control symptoms. This information is not intended to replace advice given to you by your health care provider. Make sure you discuss any questions you have with your health care provider. Document Revised: 10/21/2019 Document Reviewed: 09/30/2019 Elsevier Patient Education  2021 Elsevier Inc.    Heartburn During Pregnancy Heartburn is a type of pain or discomfort that can happen in the throat or chest. It is often described as a burning sensation. Heartburn is common during pregnancy because:  Progesterone, a hormone that is released during pregnancy, may  relax the valve that separates the esophagus from the stomach (lower esophageal sphincter, or LES). This allows stomach acid to move up into the esophagus, causing heartburn.  The uterus gets larger and pushes up on the stomach, which pushes more acid into the esophagus. This is especially true in the later stages of pregnancy. Heartburn usually goes away or gets better after giving birth. What are the causes? This condition is caused by stomach acid backing up into the esophagus (reflux). Reflux can be triggered by:  Changing hormone levels.  Large meals.  Certain foods and beverages, such as coffee, chocolate, onions, and peppermint.  Exercise.  Increased stomach acid production. What increases the risk? You are more likely to develop this condition if:  You had heartburn prior to becoming pregnant.  You have been pregnant more than once before.  You are overweight or obese. The likelihood that you will get heartburn also increases as you get further along in your pregnancy, especially during the last trimester. What are the signs or symptoms? Symptoms of this condition include:  Burning pain in the chest or lower throat.  A bitter taste in the mouth.  Coughing.  Problems swallowing.  Vomiting.  A hoarse voice.  Asthma. Symptoms may get worse when you lie down or bend over. Symptoms are often worse at night. How is this diagnosed? This condition is diagnosed based on:  Your medical history.  Your symptoms.  Blood tests to check for a certain type of bacteria that is associated with heartburn.  Whether taking heartburn medicine relieves your symptoms.  An examination of the stomach and esophagus using a tube that has a light and camera (endoscopy). How is this treated? Treatment for this condition depends on how severe your symptoms are. Your health care provider may recommend:  Over-the-counter medicines for mild heartburn, such as antacids or acid  reducers.  Prescription medicines to decrease stomach acid or to protect your stomach lining.  Certain changes in your diet.  Raising the head of your bed so it is higher than the foot of the bed. This helps prevent stomach acid from backing up into the esophagus when you are lying down. Follow these instructions at home: Eating and drinking  Do not drink alcohol during your pregnancy.  Identify foods and beverages that make your symptoms worse and avoid them.  Eat small, frequent meals instead of large meals.  Avoid drinking large amounts of liquid with your meals.  Avoid eating meals during the 2-3 hours before bedtime.  Avoid lying down right after you eat.  Do not exercise right after you eat. Beverages to avoid  Coffee and tea, with or without caffeine.  Energy drinks and sports drinks.  Carbonated drinks or sodas.  Citrus fruit juices. Foods to avoid  Chocolate and cocoa.  Peppermint and mint flavorings.  Garlic, onions, and horseradish.  Spicy and acidic foods, including peppers, chili powder, curry powder, vinegar, hot sauces, and barbecue sauce.  Citrus fruits, such as oranges, lemons, and limes.  Tomato-based foods, such as red sauce, chili, and salsa.  Fried and fatty foods, such as donuts, french fries, potato chips, and high-fat dressings.  High-fat meats, such as hot dogs, precooked or cured meat, sausage, ham, and bacon.  High-fat dairy items, such as whole milk, butter, and cheese. Medicines  Take over-the-counter and prescription medicines only as told by your health care provider.  Do not take aspirin or NSAIDs, such as ibuprofen, unless your health care provider tells you to take them.  You may be instructed to avoid medicines that contain sodium bicarbonate. General instructions  If directed, raise the head of your bed about 6 inches (15 cm) by putting blocks under the legs. Sleeping with more pillows does not effectively relieve  heartburn because it only changes the position of your head.  Do not use any products that contain nicotine or tobacco, such as cigarettes, e-cigarettes, and chewing tobacco. If you need help quitting, ask your health care provider.  Wear loose-fitting clothing.  Try to reduce your stress, such as with yoga or meditation. If you need help managing stress, ask your health care provider.  Maintain a healthy weight. If you are overweight, work with your health care provider to safely manage your weight.  Keep all follow-up visits as told by your health care provider. This is important. Where to find more information  American Pregnancy Association: americanpregnancy.org Contact a health care provider if:  Your symptoms do not improve with treatment, or you develop new symptoms.  You have unexplained weight loss.  You have difficulty swallowing.  You make loud sounds when you breathe (wheeze).  You have a cough that does not go away.  You have frequent heartburn for  more than 2 weeks.  You have nausea or vomiting that does not get better with treatment.  You have pain in your abdomen. Get help right away if:  You have severe chest pain that spreads to your arm, neck, or jaw.  You feel sweaty, dizzy, or light-headed.  You have shortness of breath.  You have pain when swallowing.  You vomit, and your vomit looks like blood or coffee grounds.  Your stool is bloody or black. Summary  Heartburn in pregnancy is common, especially during the last trimester.  This condition is caused by stomach acid backing up into the esophagus (reflux).  This condition can be treated with medicines, changes to your diet, or elevating the head of your bed.  Keep all follow-up visits as told by your health care provider. This is important. This information is not intended to replace advice given to you by your health care provider. Make sure you discuss any questions you have with your health  care provider. Document Revised: 11/28/2018 Document Reviewed: 11/28/2018 Elsevier Patient Education  2021 [employer].

## 2020-06-19 LAB — GC/CHLAMYDIA PROBE AMP (~~LOC~~) NOT AT ARMC
Chlamydia: NEGATIVE
Comment: NEGATIVE
Comment: NORMAL
Neisseria Gonorrhea: NEGATIVE

## 2021-10-02 IMAGING — US US OB < 14 WEEKS - US OB TV
1 series · 15 of 28 positions shown · non-contrast
Comparison: None.

CLINICAL DATA: Pregnancy, pain.

EXAM:
OBSTETRIC <14 WK US AND TRANSVAGINAL OB US
TECHNIQUE: Both transabdominal and transvaginal ultrasound examinations were
performed for complete evaluation of the gestation as well as the
maternal uterus, adnexal regions, and pelvic cul-de-sac.
Transvaginal technique was performed to assess early pregnancy.

[Series 1: us ob < 14 weeks - us ob tv · 15 of 46 slices shown]
[im 1/46]
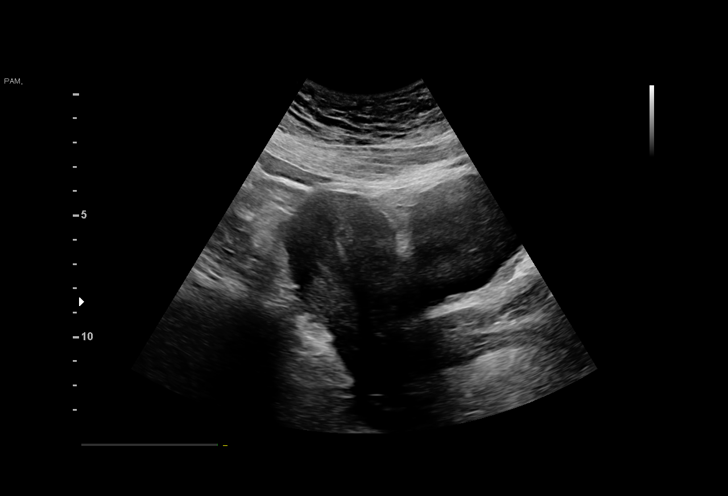
[im 4/46]
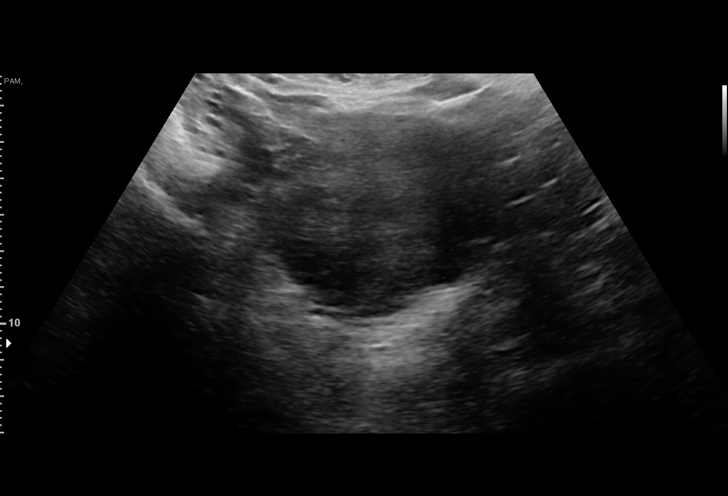
[im 7/46]
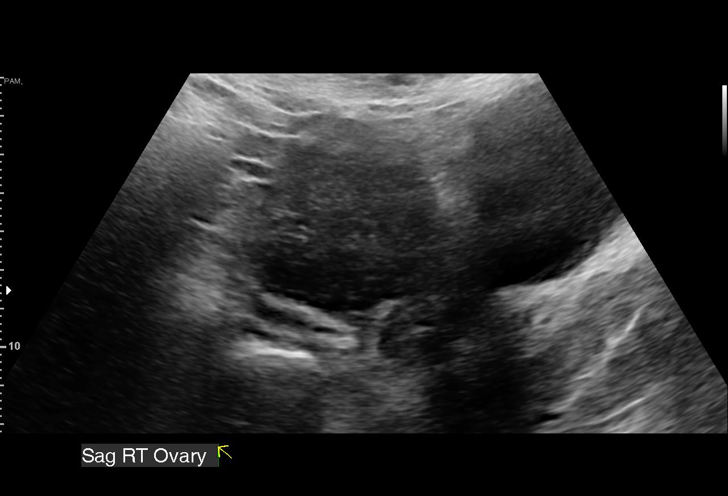
[im 11/46]
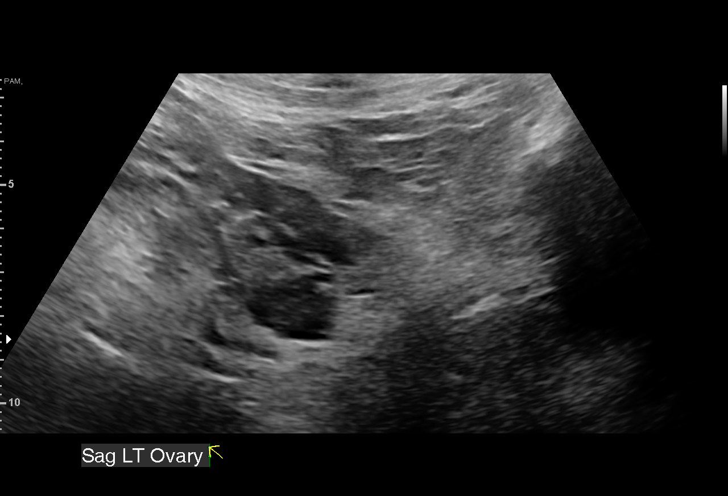
[im 14/46]
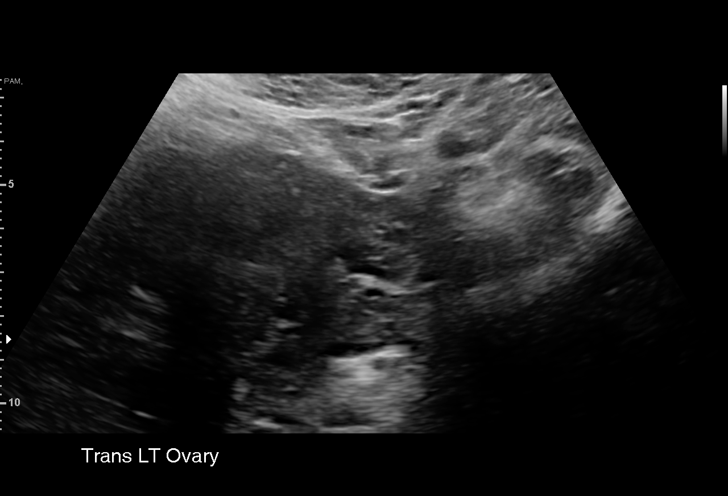
[im 17/46]
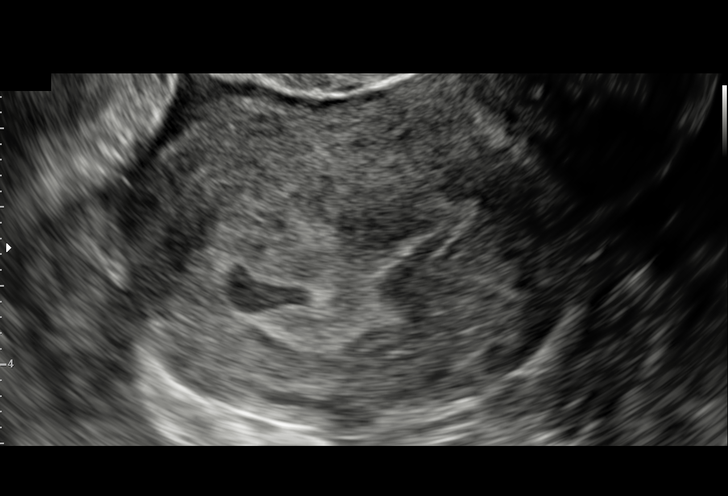
[im 21/46]
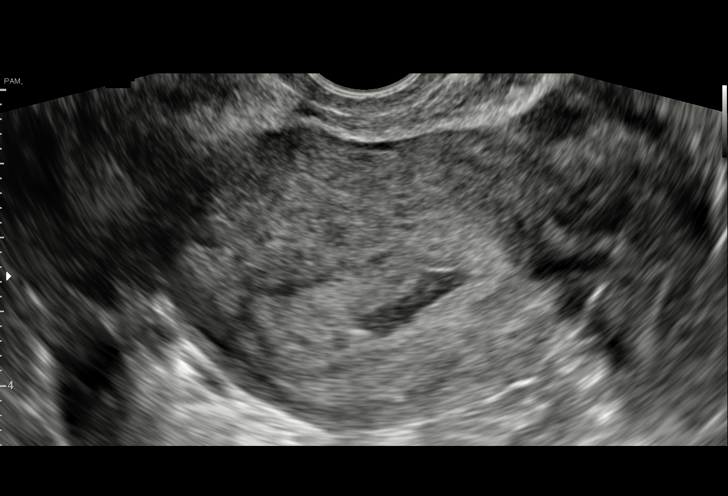
[im 24/46]
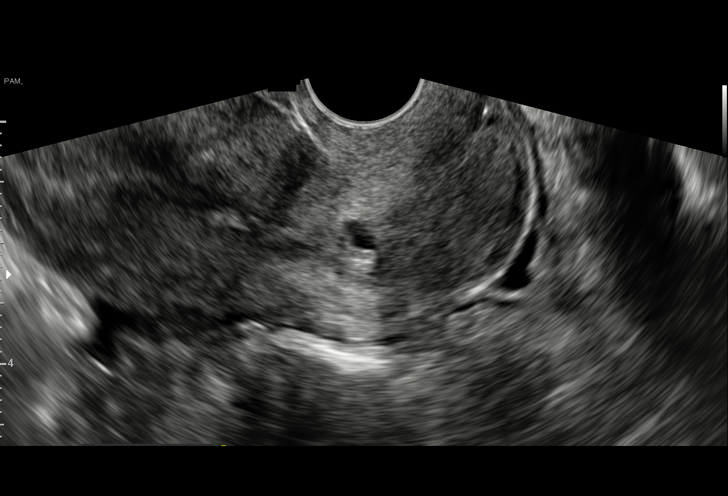
[im 26/46]
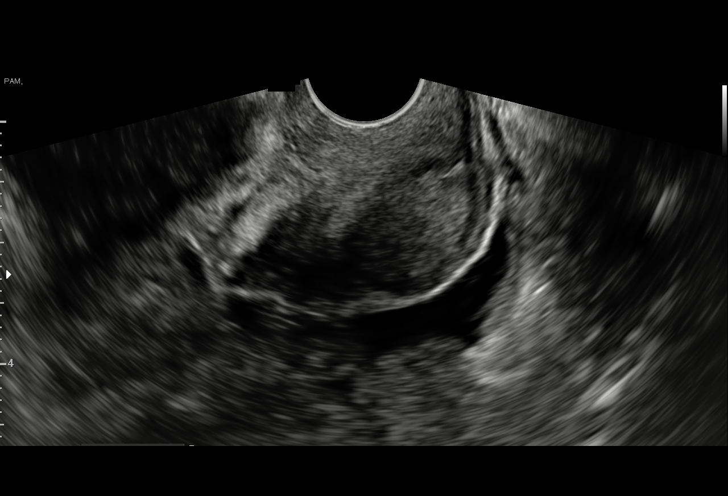
[im 29/46]
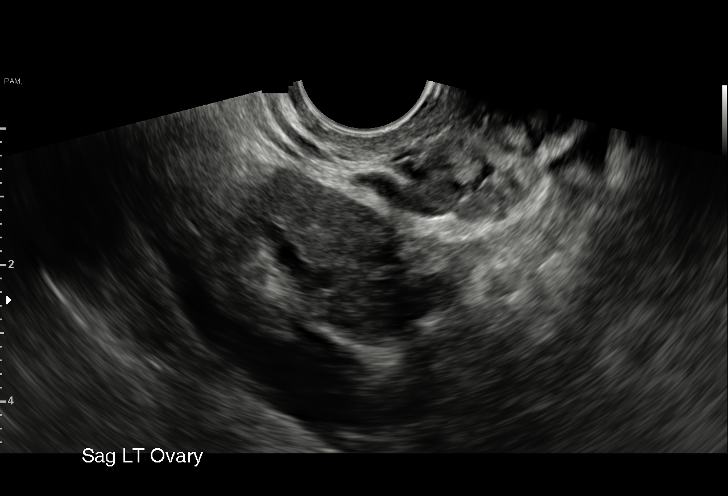
[im 32/46]
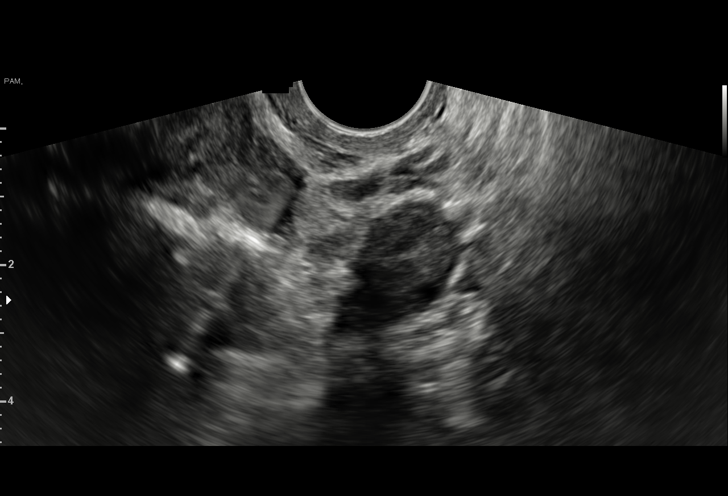
[im 36/46]
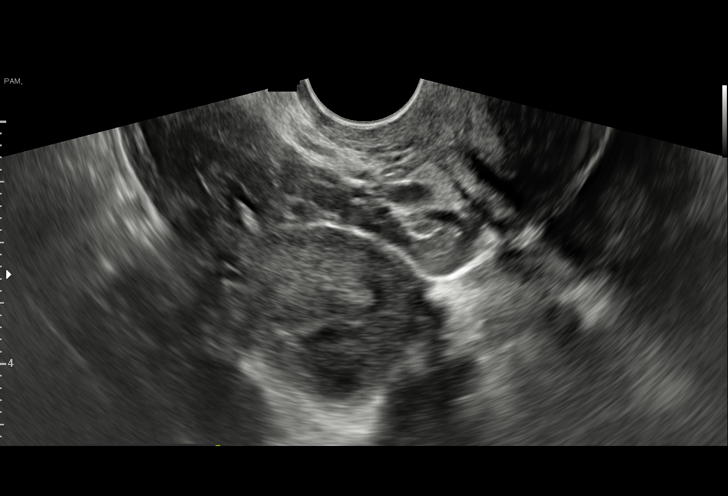
[im 39/46]
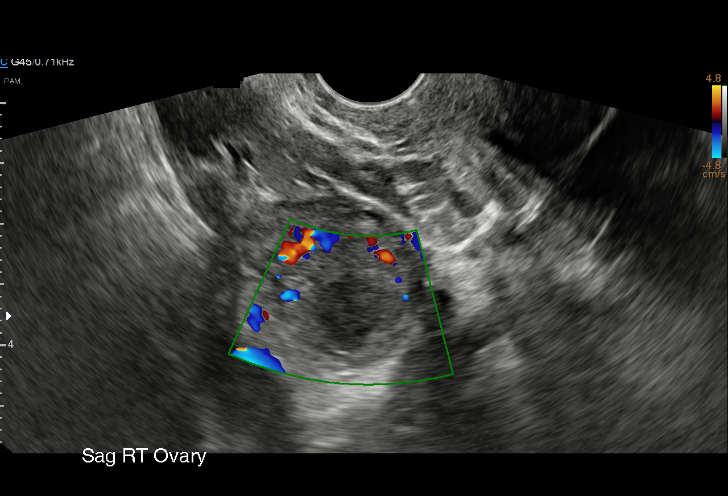
[im 42/46]
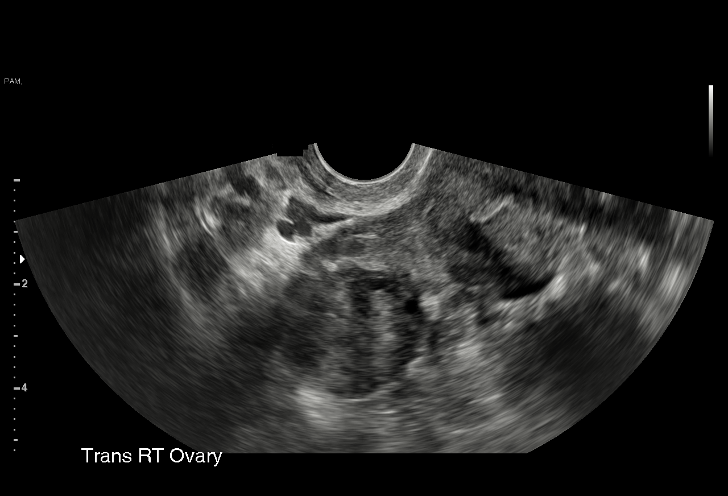
[im 46/46]
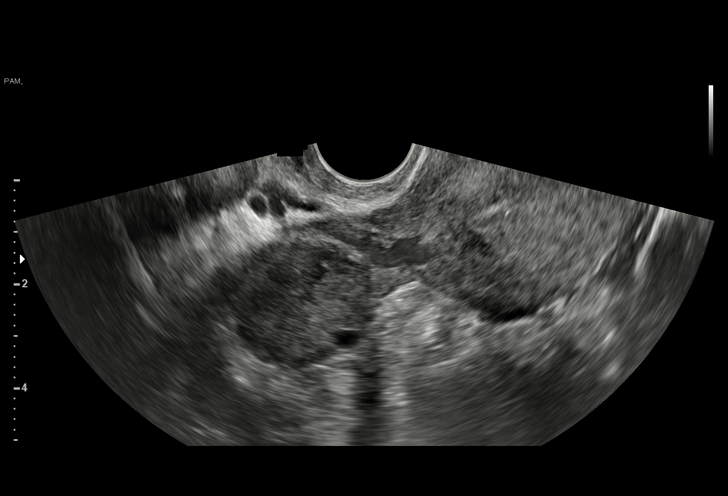

[15 of 28 positions shown; findings below may reference images not displayed]

FINDINGS: Intrauterine gestational sac: No normal appearing gestational sac is
identified. Fluid like collection within the fundal portion of the
endometrial canal is irregular and more likely blood products than
gestational sac.

Yolk sac:  Not Seen

Embryo:  Not Seen

Cardiac Activity: Not Seen

Maternal uterus/adnexae: Uterus is otherwise unremarkable. Probable
corpus luteum within the RIGHT ovary. No mass or free fluid within
the adjacent RIGHT adnexal region. LEFT ovary appears normal. No
mass or free fluid in the LEFT adnexal region. Trace simple
appearing free fluid in the cul-de-sac.
IMPRESSION: 1. No normal-appearing intrauterine pregnancy is identified.
Fluid-like collection within the fundal portion of the endometrial
canal is irregular and more likely blood products than gestational
sac. This may indicate spontaneous abortion in progress.
2. No evidence of ectopic pregnancy. No mass or free fluid within
either adnexal region.
3. Probable corpus luteum within the RIGHT ovary.

## 2021-11-05 IMAGING — US US OB COMP LESS 14 WK
1 series · 15 of 23 positions shown · non-contrast
Comparison: 11/25/2019

CLINICAL DATA: Pregnancy of unknown location, nausea, vomiting

EXAM:
OBSTETRIC <14 WK ULTRASOUND
TECHNIQUE: Transabdominal ultrasound was performed for evaluation of the
gestation as well as the maternal uterus and adnexal regions.

[Series 1: us ob comp less 14 wk · 15 of 23 slices shown]
[im 1/23]
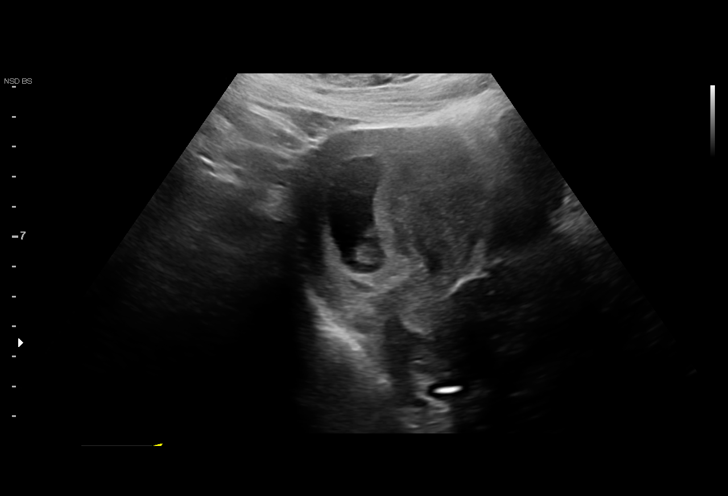
[im 3/23]
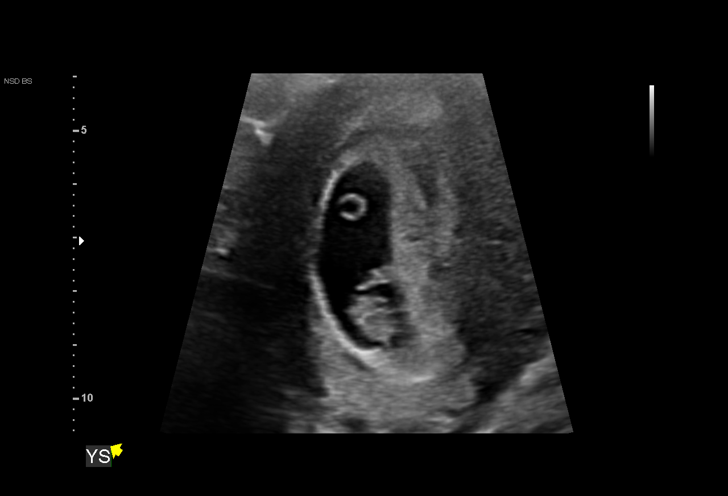
[im 4/23]
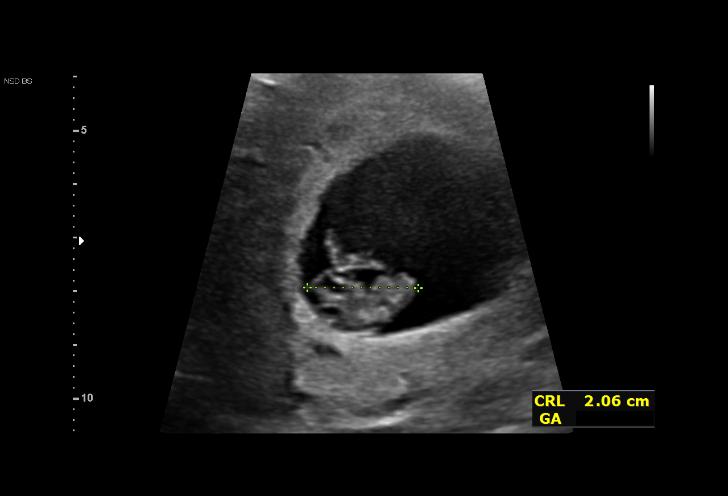
[im 6/23]
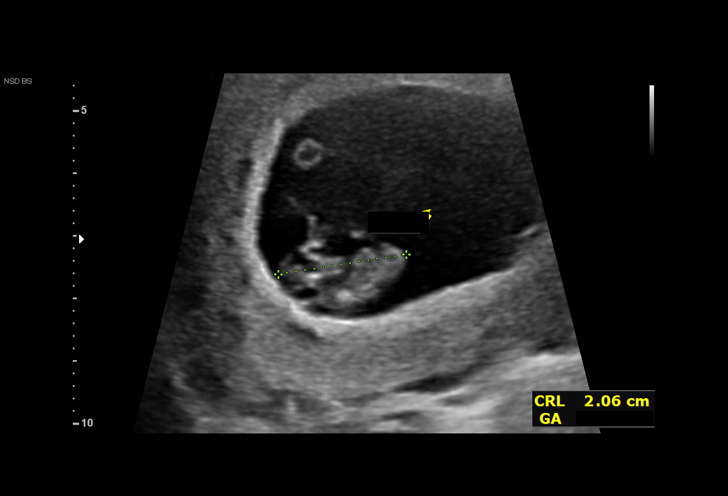
[im 7/23]
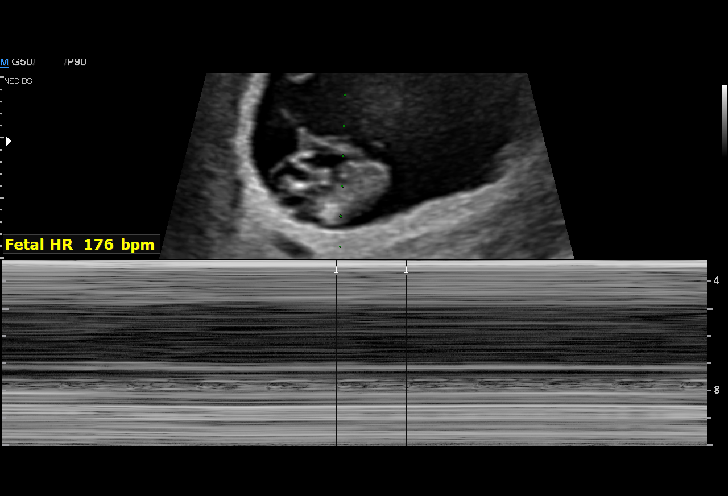
[im 9/23]
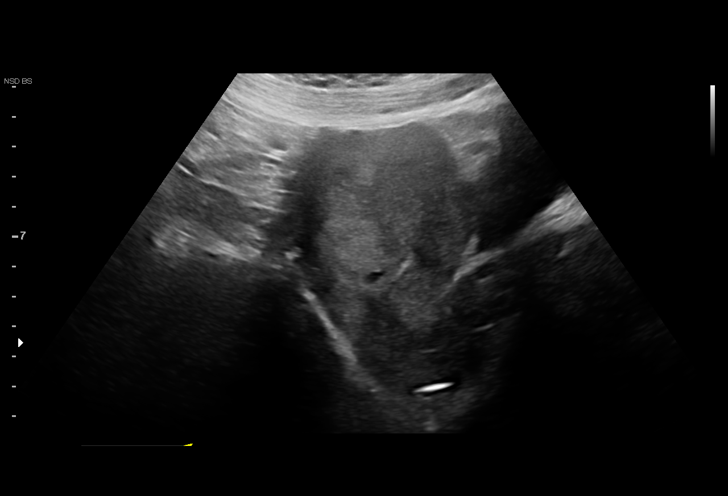
[im 10/23]
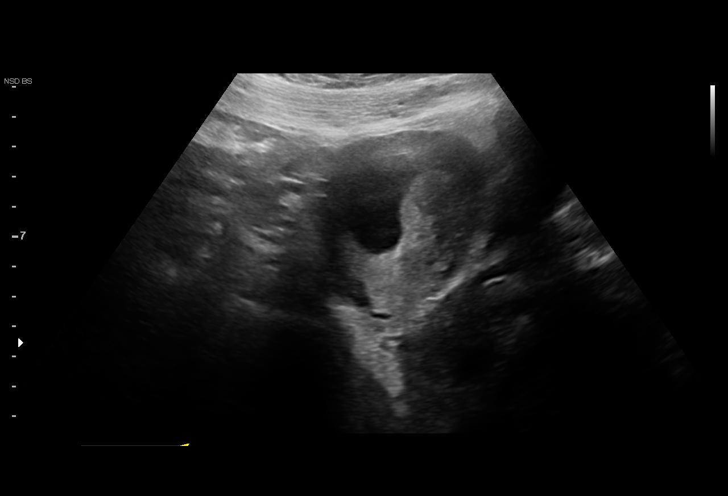
[im 12/23]
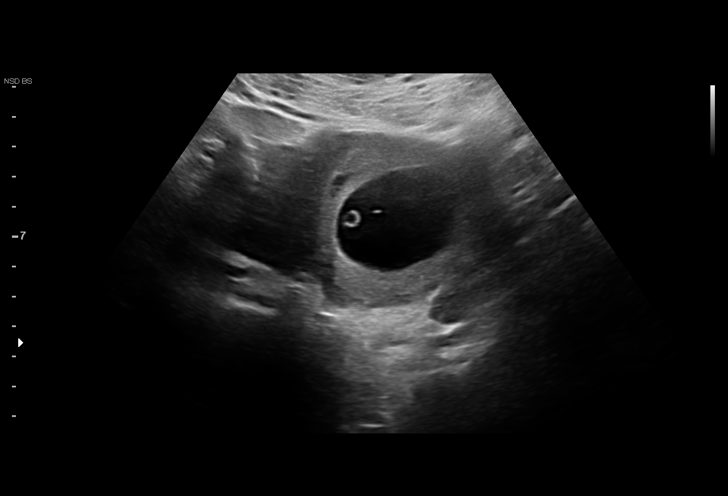
[im 14/23]
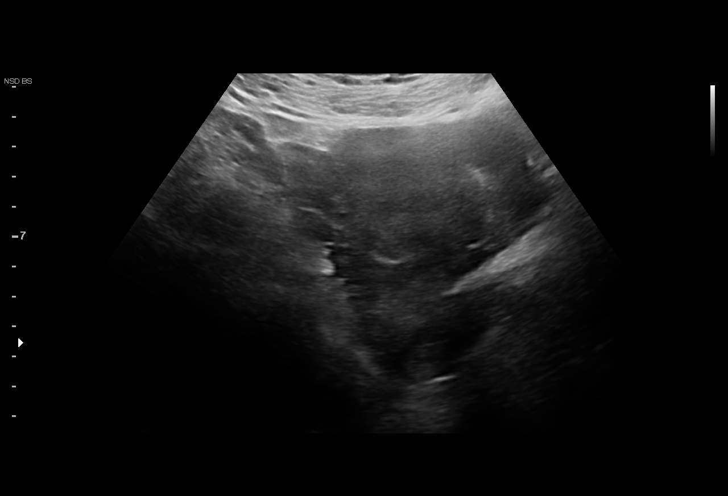
[im 15/23]
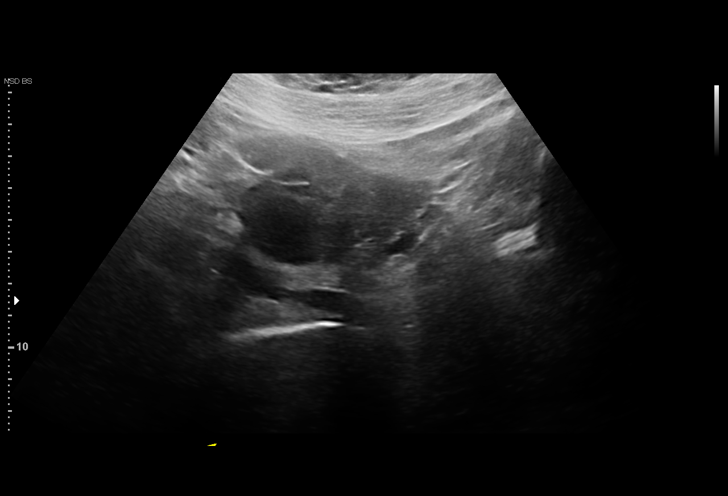
[im 17/23]
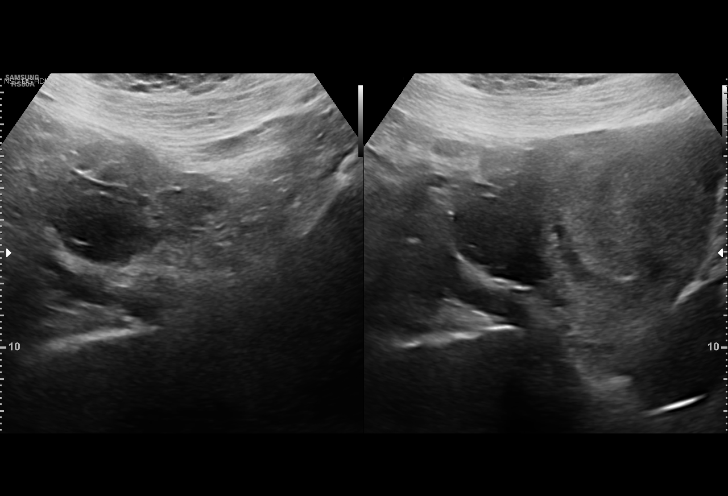
[im 18/23]
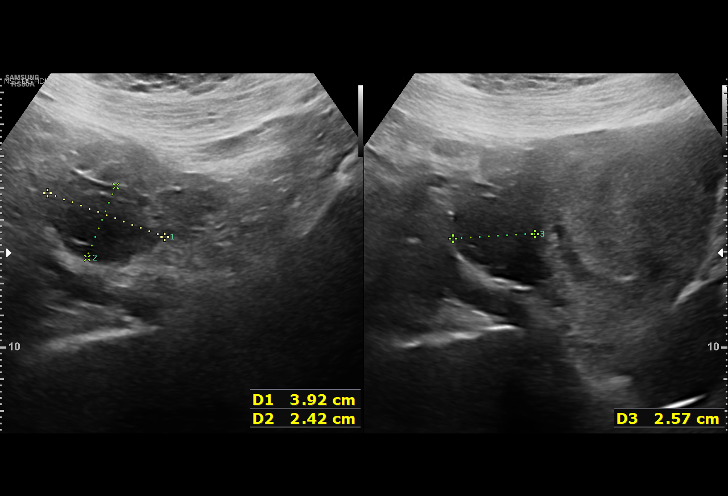
[im 20/23]
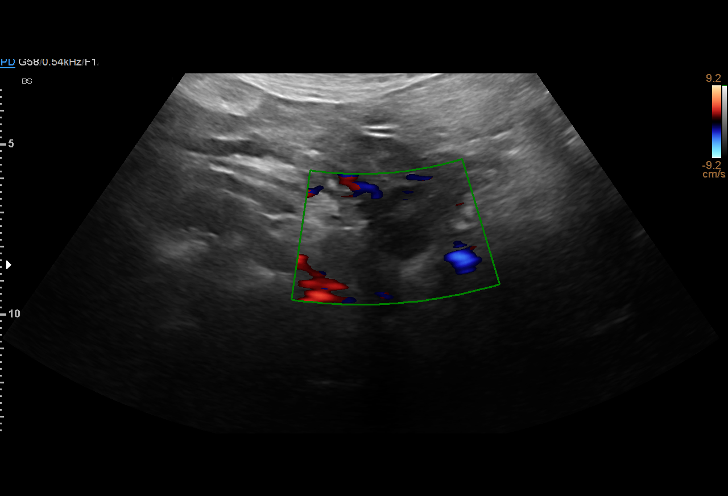
[im 21/23]
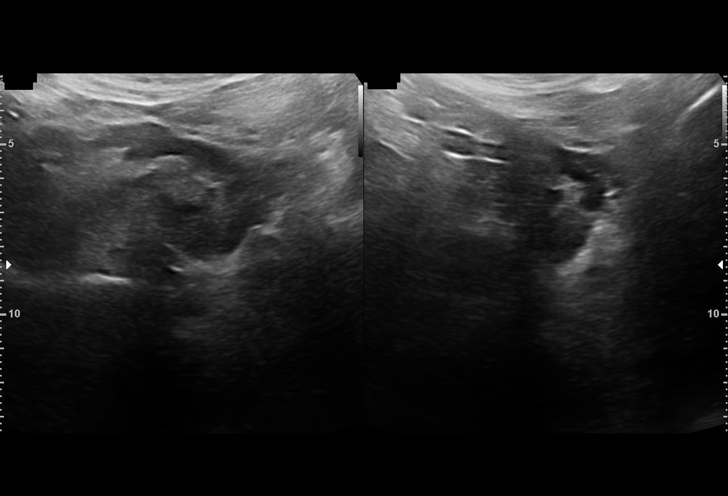
[im 23/23]
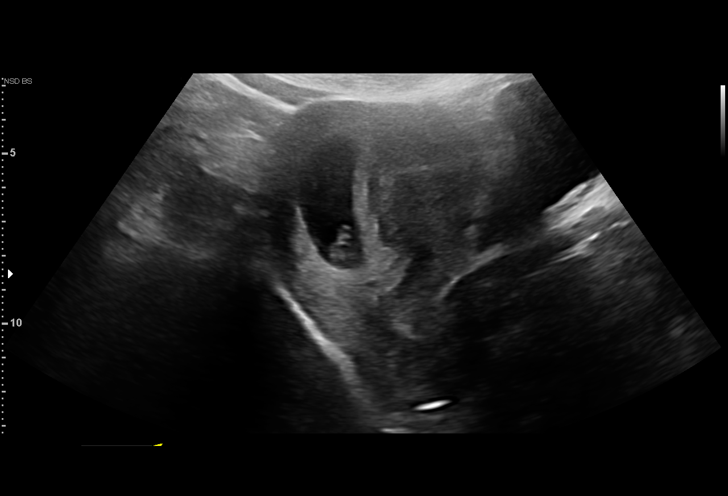

[15 of 23 positions shown; findings below may reference images not displayed]

FINDINGS: Intrauterine gestational sac: Present, single

Yolk sac:  Present, single, normal-appearing

Embryo:  Present, single

Cardiac Activity: Present, regular

Heart Rate: 176 bpm

MSD: Appropriate given fetal size

CRL:   21 mm   8 w 4 d                  US EDC: 08/05/2020

Subchorionic hemorrhage:  None visualized.

Maternal uterus/adnexae: No intrauterine masses are seen. The
ovaries are normal in size and echogenicity. No adnexal masses are
seen. There is no free fluid within the cul-de-sac.
IMPRESSION: Single living intrauterine gestation with an estimated gestational
age of 8 weeks, 4 days.

## 2022-04-26 IMAGING — US US OB COMP LESS 14 WK
1 series · 15 of 28 positions shown · non-contrast
Comparison: Obstetrical ultrasound from prior gestation 02/08/2020

CLINICAL DATA: Pain for 1 day, no vaginal bleeding

EXAM:
OBSTETRIC <14 WK ULTRASOUND
TECHNIQUE: Transabdominal ultrasound was performed for evaluation of the
gestation as well as the maternal uterus and adnexal regions.

[Series 1: us ob comp less 14 wk · 29 acquisitions, 15 frames shown]
[im 1/29]
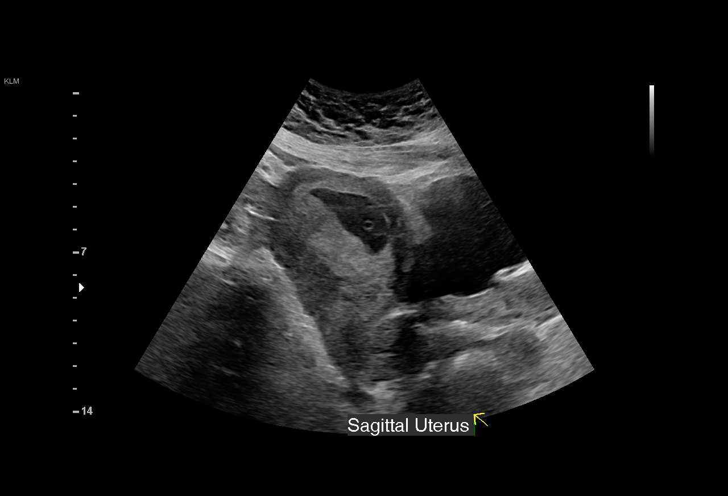
[im 3/29]
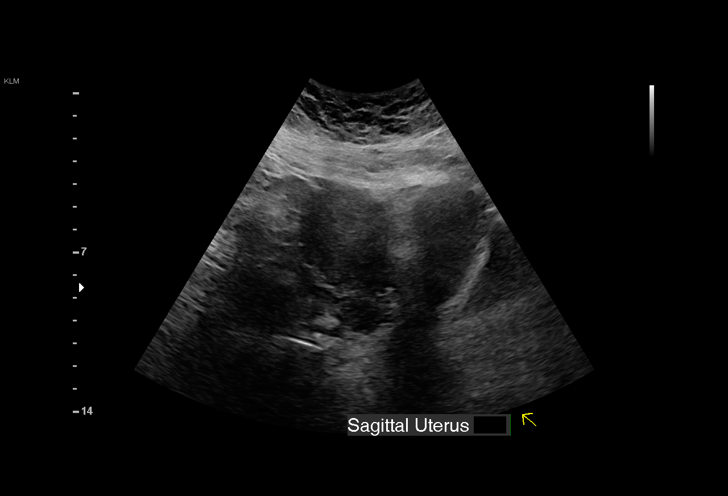
[im 5/29]
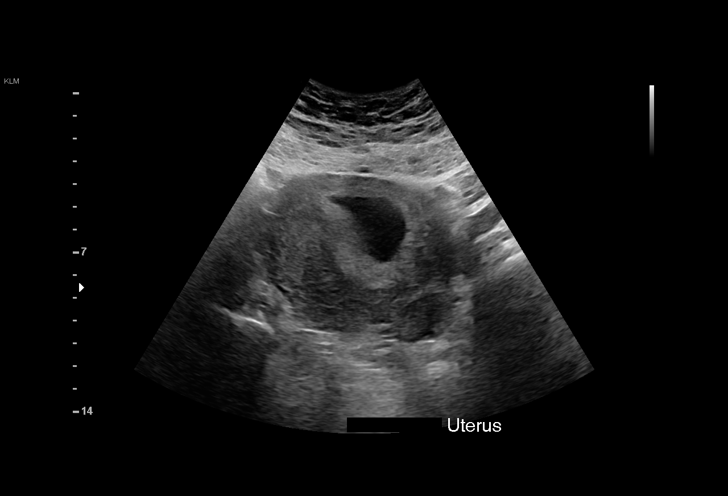
[im 7/29]
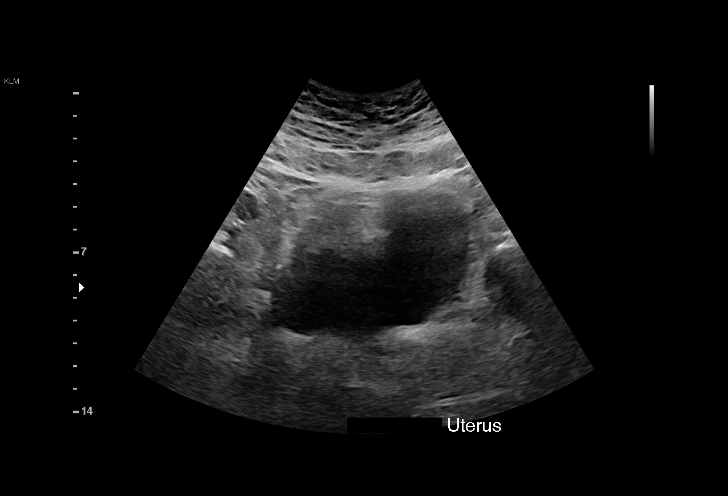
[im 9/29]
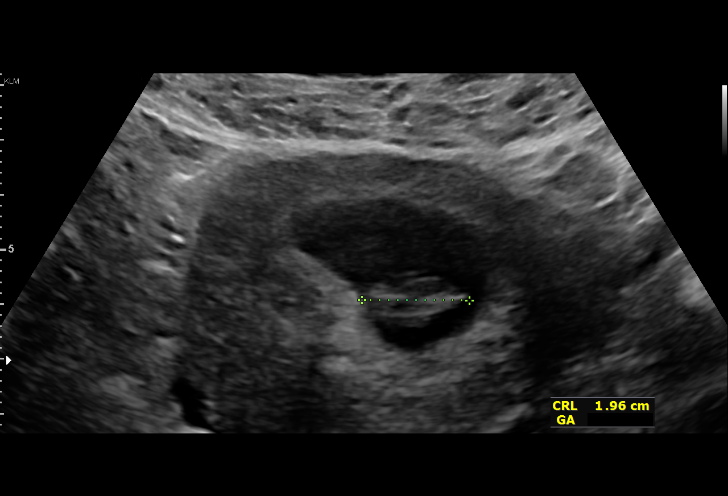
[im 11/29]
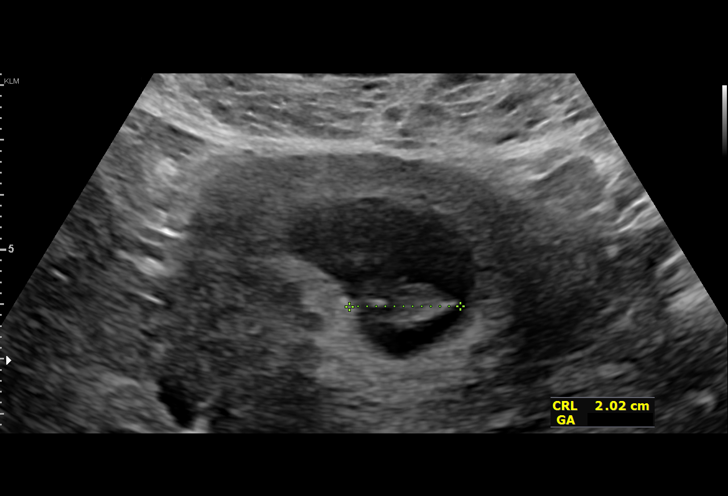
[im 13/29]
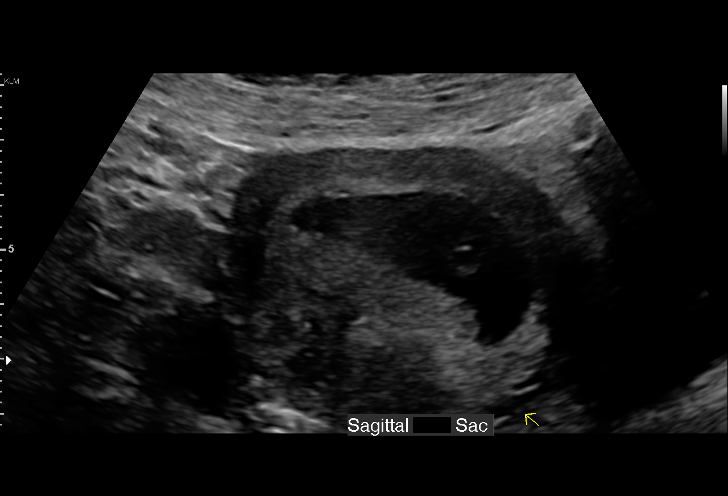
[im 15/29]
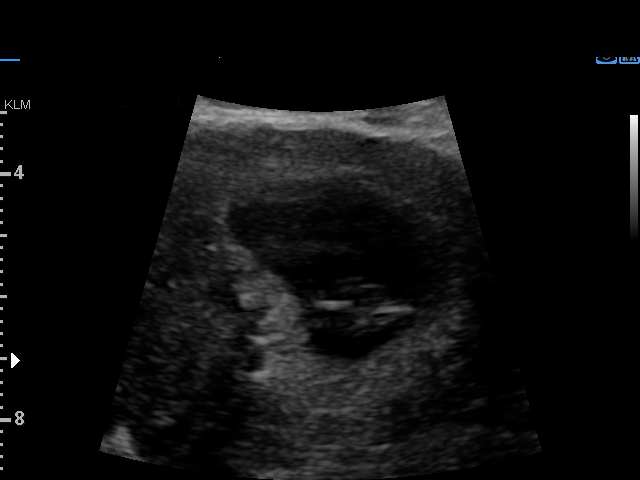
[im 16/29]
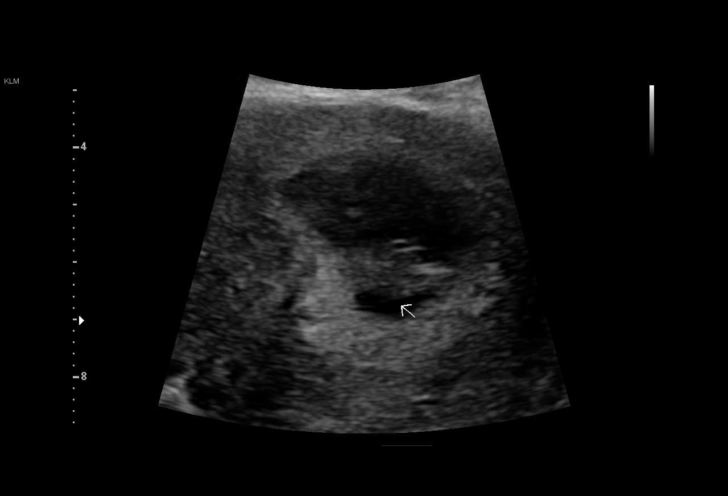
[im 18/29]
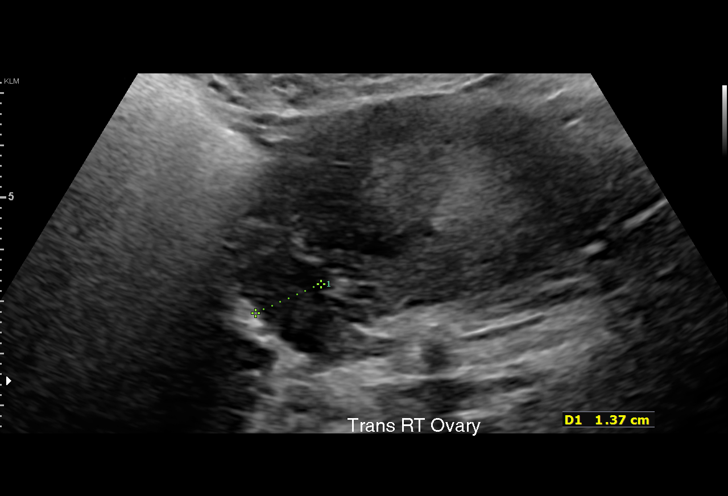
[im 20/29]
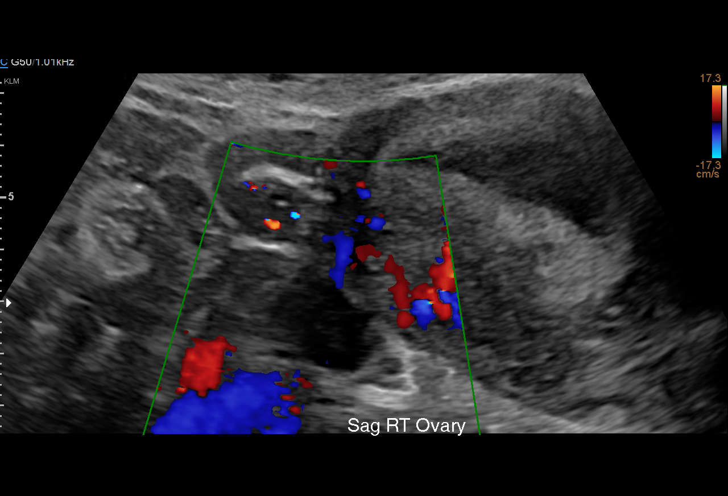
[im 22/29]
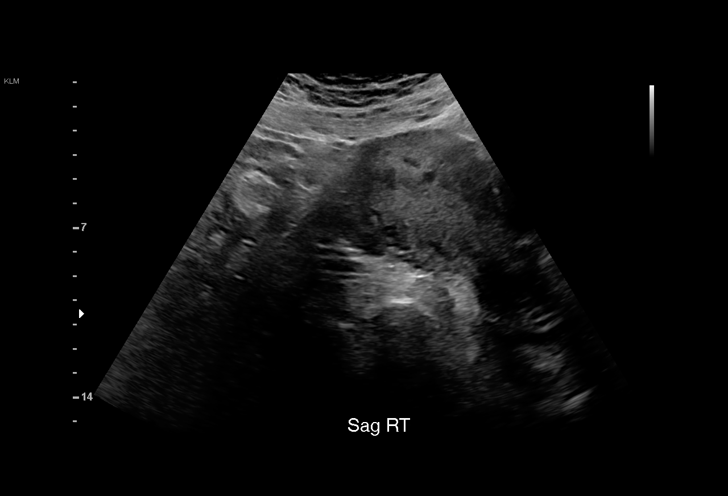
[im 24/29]
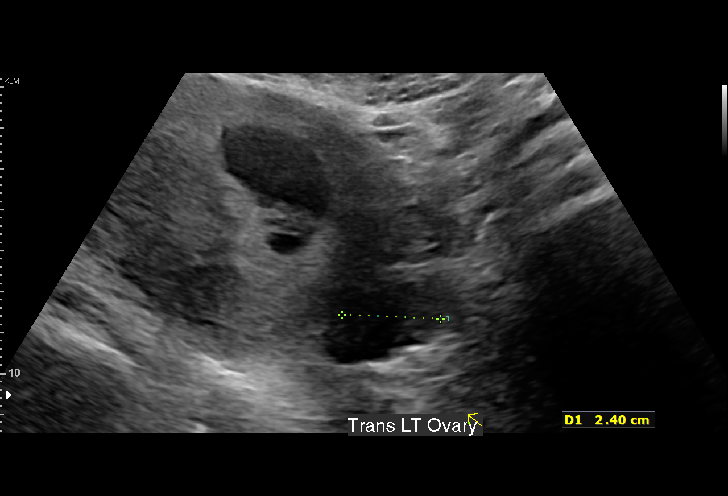
[im 26/29]
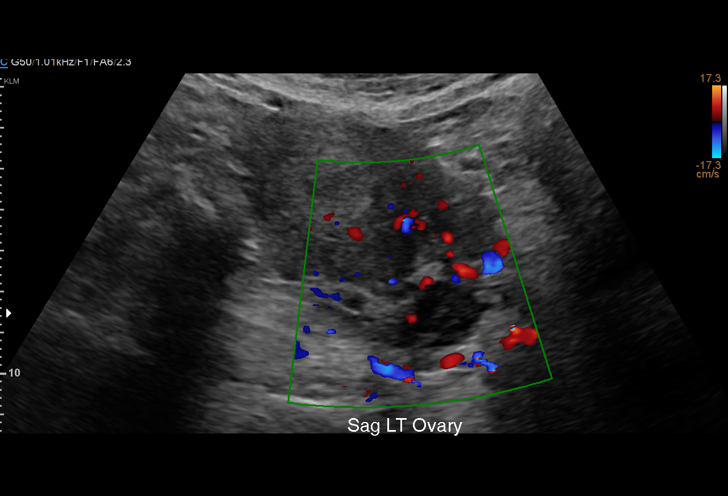
[im 29/29]
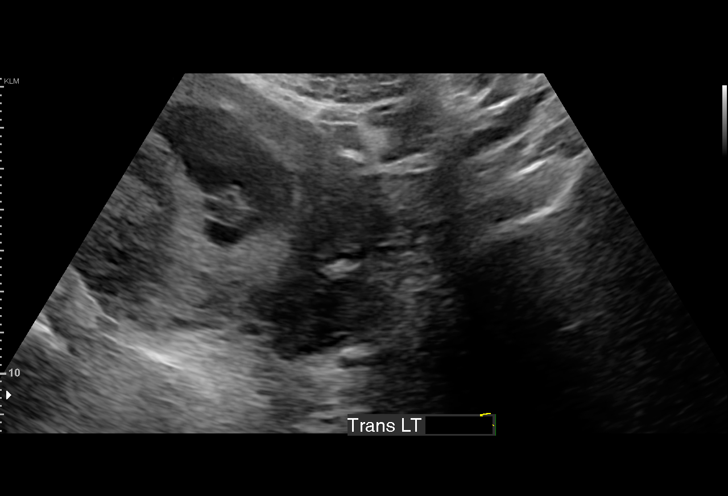

[15 of 28 positions shown; findings below may reference images not displayed]

FINDINGS: Intrauterine gestational sac: Single

Yolk sac:  Visualized.

Embryo:  Visualized.

Cardiac Activity: Visualized.

Heart Rate: 176 bpm

CRL:   19.5 mm   8 w 3 d                  US EDC: 01/25/2021

Subchorionic hemorrhage:  None visualized.

Maternal uterus/adnexae: Anteverted uterus. No acute or worrisome
uterine abnormalities. Normal appearance of the ovaries. No free
fluid.
IMPRESSION: Single viable intrauterine gestation at 8 weeks, 3 days by
crown-rump length sonographic estimation.

No acute sonographic complication.

## 2022-10-21 ENCOUNTER — Telehealth (HOSPITAL_BASED_OUTPATIENT_CLINIC_OR_DEPARTMENT_OTHER): Payer: Self-pay

## 2022-10-21 NOTE — Telephone Encounter (Signed)
Patient calling to schedule a termination.   LMP: 07/04/2022    Per patient 15 weeks 4 days pregnant.

## 2022-10-21 NOTE — Telephone Encounter (Signed)
Patient returning call, ccr attempted to warm transfer to care team but clinic was busy assisting other patients, please assist.

## 2022-10-21 NOTE — Telephone Encounter (Signed)
CFP RN review of desired care and coordination of care:    Is patient currently in the state of [place]? No- Resides in [place]     Left message for pt to call back

## 2022-10-24 ENCOUNTER — Other Ambulatory Visit (HOSPITAL_BASED_OUTPATIENT_CLINIC_OR_DEPARTMENT_OTHER): Payer: Self-pay | Admitting: Obstetrics & Gynecology

## 2022-10-24 DIAGNOSIS — Z332 Encounter for elective termination of pregnancy: Secondary | ICD-10-CM | POA: Insufficient documentation

## 2022-10-24 NOTE — Telephone Encounter (Signed)
Routed encounter to [facility] provider to place surgery order and then will route to [facility] to determine cost estimate.

## 2022-10-24 NOTE — Telephone Encounter (Signed)
Phone call to pt and LMTCB.

## 2022-10-24 NOTE — Telephone Encounter (Signed)
Phone call rec'd from pt. Pt reports she is residing in [place] state and is wanting information on a termination of pregnancy. Pt stated her LMP was 07/04/22. 16 weeks from LMP. Pt is unsure if her insurance will cover this procedure and is looking for cost estimate. Reported an OR procedure costs 10-15 K but the [facility] can gather information from pt and submit a cost estimate from FACT team. Pt also given [facility] number and pt stated she does make less than 50 k per year. Pt will also follow up with CR.  Routed to [facility] and marked as high priority.

## 2022-10-24 NOTE — Telephone Encounter (Signed)
Case request placed.    I called and left pt a VM w/ my name, [facility] callback number, and offer to help with coordination as I can.  I sent a MyChart portal signup code to her phone so that we can message in case that is easier for her.    As pt is in [place], nearest clinics would be in [place], [place], [place], [place] ([email or web address]).  However, it may be that she has a place to stay here that would make it a preferable spot for her or that flights are cheaper here.  [facility] or [facility] Parenthood will be markedly cheaper than an inpatient abortion.    Other helpful resources for a patient in this situation may be the Health Net 7620942247), the Miscarriage and Abortion hotline 574-412-2077), and If/When/How (276)104-2137).  NAF may help with funding and finding a provider, the M&A hotline has information about self-managing abortion, and if/when/how offers legal advice to people who may experience criminalization for pregnancy outcomes.    Rolan Lipa MD, MPH

## 2022-10-25 ENCOUNTER — Telehealth (HOSPITAL_BASED_OUTPATIENT_CLINIC_OR_DEPARTMENT_OTHER): Payer: Self-pay

## 2022-10-25 ENCOUNTER — Telehealth (HOSPITAL_BASED_OUTPATIENT_CLINIC_OR_DEPARTMENT_OTHER): Payer: Self-pay | Admitting: Obstetrics & Gynecology

## 2022-10-25 NOTE — Telephone Encounter (Signed)
RETURN CALL: Voicemail - Detailed Message      SUBJECT:  General Message     MESSAGE: Patient returning call regarding pregnancy termination.  Please call patient at [phone] or [phone].  CCR attempted to warm transfer, clinic assisting other patients.

## 2022-10-25 NOTE — Telephone Encounter (Signed)
Cost Estimate Letter sent to patient from FACT/ Cost estimate team via email and Copy of letter loaded to media.

## 2022-10-25 NOTE — Telephone Encounter (Signed)
Writer phoned patient to discuss fact team estimate.   No answer, LM. Instructed patient to call writer back to discuss details of fact team estimate  Patient not signed up for my chart.

## 2022-10-25 NOTE — Telephone Encounter (Signed)
Cost Estimate request sent to FACT / Cost estimate team.

## 2022-10-25 NOTE — Telephone Encounter (Signed)
Duplicate

## 2022-10-25 NOTE — Telephone Encounter (Signed)
Termination of Pregnancy Cost Estimate     TC/MyChart message to pt: no mychart created, FACT sent estimate letter via Patient Email.    Insurance coverage obtained/confirmed: SELF PAY      Urgent Cost Estimate requested of FACT Team.     COST ESTIMATE LETTER ADDED TO MEDIA     ROUTED TO CFP POOL

## 2022-10-25 NOTE — Telephone Encounter (Signed)
Writer forwarded encounter to [facility] team for FACT involvement/ cost estimate.

## 2022-11-04 ENCOUNTER — Encounter (HOSPITAL_COMMUNITY): Payer: Self-pay | Admitting: Pain Medicine

## 2022-11-04 DIAGNOSIS — Z332 Encounter for elective termination of pregnancy: Secondary | ICD-10-CM | POA: Insufficient documentation

## 2022-11-04 MED FILL — Azithromycin IV For Soln: Qty: 250 | Status: AC

## 2022-11-16 DIAGNOSIS — O99210 Obesity complicating pregnancy, unspecified trimester: Secondary | ICD-10-CM | POA: Insufficient documentation

## 2022-11-16 DIAGNOSIS — O0930 Supervision of pregnancy with insufficient antenatal care, unspecified trimester: Secondary | ICD-10-CM | POA: Insufficient documentation

## 2022-11-16 LAB — OB RESULTS CONSOLE RUBELLA ANTIBODY, IGM: Rubella: IMMUNE

## 2022-11-16 LAB — OB RESULTS CONSOLE RPR: RPR: NONREACTIVE

## 2022-11-16 LAB — OB RESULTS CONSOLE HEPATITIS B SURFACE ANTIGEN: Hepatitis B Surface Ag: NEGATIVE

## 2022-11-16 LAB — HEPATITIS C ANTIBODY: HCV Ab: NEGATIVE

## 2022-11-24 LAB — PANORAMA PRENATAL TEST FULL PANEL:PANORAMA TEST PLUS 5 ADDITIONAL MICRODELETIONS: FETAL FRACTION: 7

## 2022-12-13 DIAGNOSIS — R87612 Low grade squamous intraepithelial lesion on cytologic smear of cervix (LGSIL): Secondary | ICD-10-CM | POA: Insufficient documentation

## 2022-12-13 HISTORY — DX: Low grade squamous intraepithelial lesion on cytologic smear of cervix (LGSIL): R87.612

## 2023-02-23 LAB — OB RESULTS CONSOLE HIV ANTIBODY (ROUTINE TESTING): HIV: NONREACTIVE

## 2023-03-12 ENCOUNTER — Other Ambulatory Visit: Payer: Self-pay

## 2023-03-12 ENCOUNTER — Encounter (HOSPITAL_COMMUNITY): Payer: Self-pay | Admitting: Obstetrics and Gynecology

## 2023-03-12 ENCOUNTER — Inpatient Hospital Stay (HOSPITAL_COMMUNITY)
Admission: AD | Admit: 2023-03-12 | Discharge: 2023-03-12 | Disposition: A | Discharge status: Home / Self Care | Payer: Medicaid Other | Attending: Obstetrics and Gynecology | Admitting: Obstetrics and Gynecology

## 2023-03-12 DIAGNOSIS — R1110 Vomiting, unspecified: Secondary | ICD-10-CM | POA: Diagnosis present

## 2023-03-12 DIAGNOSIS — O26893 Other specified pregnancy related conditions, third trimester: Secondary | ICD-10-CM | POA: Diagnosis not present

## 2023-03-12 DIAGNOSIS — R110 Nausea: Secondary | ICD-10-CM | POA: Insufficient documentation

## 2023-03-12 DIAGNOSIS — O99613 Diseases of the digestive system complicating pregnancy, third trimester: Secondary | ICD-10-CM | POA: Diagnosis not present

## 2023-03-12 DIAGNOSIS — K5900 Constipation, unspecified: Secondary | ICD-10-CM | POA: Diagnosis not present

## 2023-03-12 DIAGNOSIS — Z3689 Encounter for other specified antenatal screening: Secondary | ICD-10-CM

## 2023-03-12 DIAGNOSIS — O4703 False labor before 37 completed weeks of gestation, third trimester: Secondary | ICD-10-CM | POA: Diagnosis present

## 2023-03-12 DIAGNOSIS — R109 Unspecified abdominal pain: Secondary | ICD-10-CM | POA: Diagnosis not present

## 2023-03-12 DIAGNOSIS — Z3A35 35 weeks gestation of pregnancy: Secondary | ICD-10-CM

## 2023-03-12 LAB — URINALYSIS, ROUTINE W REFLEX MICROSCOPIC
Bilirubin Urine: NEGATIVE
Glucose, UA: NEGATIVE mg/dL
Hgb urine dipstick: NEGATIVE
Ketones, ur: NEGATIVE mg/dL
Leukocytes,Ua: NEGATIVE
Nitrite: NEGATIVE
Protein, ur: 30 mg/dL — AB
Specific Gravity, Urine: 1.015 (ref 1.005–1.030)
pH: 7 (ref 5.0–8.0)

## 2023-03-12 LAB — OB RESULTS CONSOLE GBS: GBS: NEGATIVE

## 2023-03-12 MED ORDER — FLEET ENEMA RE ENEM
1.0000 | ENEMA | Freq: Once | RECTAL | Status: AC
  Administered 2023-03-12: 1 via RECTAL

## 2023-03-12 MED ORDER — CYCLOBENZAPRINE HCL 5 MG PO TABS
10.0000 mg | ORAL_TABLET | Freq: Once | ORAL | Status: AC
  Administered 2023-03-12: 10 mg via ORAL
  Filled 2023-03-12: qty 2

## 2023-03-12 MED ORDER — DOCUSATE SODIUM 100 MG PO CAPS: 100.0000 mg | ORAL_CAPSULE | Freq: Two times a day (BID) | ORAL | 0 refills | Status: AC

## 2023-03-12 MED ORDER — PANTOPRAZOLE SODIUM 20 MG PO TBEC: 20.0000 mg | DELAYED_RELEASE_TABLET | Freq: Every day | ORAL | 1 refills | Status: DC

## 2023-03-12 MED ORDER — ACETAMINOPHEN 325 MG PO TABS
650.0000 mg | ORAL_TABLET | Freq: Once | ORAL | Status: AC
  Administered 2023-03-12: 650 mg via ORAL
  Filled 2023-03-12: qty 2

## 2023-03-12 MED ORDER — METOCLOPRAMIDE HCL 10 MG PO TABS
10.0000 mg | ORAL_TABLET | Freq: Once | ORAL | Status: AC
  Administered 2023-03-12: 10 mg via ORAL
  Filled 2023-03-12: qty 1

## 2023-03-12 MED ORDER — GLYCOPYRROLATE 1 MG PO TABS: 1.0000 mg | ORAL_TABLET | Freq: Three times a day (TID) | ORAL | 0 refills | Status: DC | PRN

## 2023-03-12 NOTE — MAU Note (Signed)
.  [place] Boursiquot is a 27 y.o. at Unknown here in MAU reporting: Pt reports ctx's that started last week and is getting stronger. Pt reports vomiting and heartburn with the ctx's. Pt reports that the medicine is not working   Onset of complaint: last week  Pain score: 7/10 There were no vitals filed for this visit.    Lab orders placed from triage:   none

## 2023-03-12 NOTE — Discharge Instructions (Signed)
  [facility] Providers          [facility]  [address], [place], [place] [address]  [phone]  [facility]  [address], [place], [place] [address]  [phone]  [facility] [place]  [address], [place], [place] [address]  [phone]  [facility] [address], [place], [place] [address] [phone]  [facility] [place]  [address], [place], [place] [address]  [phone]  [facility]  [address]First floor), [place], [place] [address]  [phone]  [facility] [place]  [address], [place], [place] [address]  [phone]  [facility]  [address], [place], [place] [address]  [phone]  [facility]  [address], [place], [place] [address]  [phone]  [facility]  [address], [place], [place] [address]  [phone]  [facility]  [address], [place], [place] [address]  [phone]  [facility]  [address], [place], [place] [address]  [phone]  [facility]   [address], [place], [place] [address]  [phone]  [facility]  [address], [place], [place] [address]   [phone]  [facility] [address], [place], [place] [address] [phone]  [facility]  [address], , [place] [address]  [phone]         

## 2023-03-12 NOTE — MAU Provider Note (Signed)
History     CSN: [id number]  Arrival date and time: 03/12/23 4098   Event Date/Time   First Provider Initiated Contact with Patient 03/12/23 0053      Chief Complaint  Patient presents with   Contractions   [place] Aguirre is a 27 y.o. [id number] at [redacted]w[redacted]d who receives care at [facility], but states she is planning to transfer to [place].  She presents today for abdominal contractions.  She states the contractions started about one week ago, but become more frequent Friday night (11pm).  She states they were not very strong, but noticeable.  She states the contractions got stronger tonight.  She denies vaginal bleeding or leaking of fluid.  She states she had some increased discharge last week and was checked and was told she was 1cm.  Patient endorses fetal movement.  She denies problems during the pregnancy.   OB History     Gravida  7   Para  3   Term  3   Preterm  0   AB  2   Living  3      SAB  1   IAB  1   Ectopic  0   Multiple  0   Live Births  3           Past Medical History:  Diagnosis Date   Obesity    UTI (urinary tract infection)     Past Surgical History:  Procedure Laterality Date   EXTERNAL EAR SURGERY Bilateral 2017   keloid removal   INDUCED ABORTION     keloid     removal on ear    Family History  Problem Relation Age of Onset   Cancer Paternal Grandfather    Diabetes Father    Hypertension Father     Social History   Tobacco Use   Smoking status: Never   Smokeless tobacco: Never  Vaping Use   Vaping status: Never Used  Substance Use Topics   Alcohol use: No   Drug use: No    Allergies: No Known Allergies  Medications Prior to Admission  Medication Sig Dispense Refill Last Dose/Taking   famotidine (PEPCID) 20 MG tablet Take 1 tablet (20 mg total) by mouth 2 (two) times daily. 60 tablet 0    glycopyrrolate (ROBINUL) 1 MG tablet Take 1 tablet (1 mg total) by mouth 3 (three) times daily. 90 tablet 0    metroNIDAZOLE  (METROGEL VAGINAL) 0.75 % vaginal gel Place 1 Applicatorful vaginally 2 (two) times daily. 70 g 0    promethazine (PHENERGAN) 25 MG tablet Take 1 tablet (25 mg total) by mouth every 6 (six) hours as needed for nausea or vomiting. 30 tablet 0     Review of Systems  Gastrointestinal:  Positive for abdominal pain (Contractions), constipation (Unsure of last bowel movement), nausea (None currently) and vomiting (Prior to arrival).  Genitourinary:  Negative for difficulty urinating, dysuria, vaginal bleeding and vaginal discharge.       Reports darker urine   Physical Exam   Blood pressure 121/81, pulse 80, temperature 98.1 F (36.7 C), resp. rate 16, SpO2 98%, unknown if currently breastfeeding.  Physical Exam Vitals and nursing note reviewed.  Constitutional:      Appearance: Normal appearance.  HENT:     Head: Normocephalic and atraumatic.  Eyes:     Conjunctiva/sclera: Conjunctivae normal.  Cardiovascular:     Rate and Rhythm: Normal rate.  Pulmonary:     Effort: Pulmonary effort is normal. No respiratory distress.  Abdominal:     Palpations: Abdomen is soft.     Comments: Gravid, Appears LGA.  Genitourinary:    Comments: GBS Culture collected  Dilation: 1 Effacement (%): Thick Cervical Position: Posterior Exam by:: J.Jacquelene Kopecky, CNM Musculoskeletal:        General: Normal range of motion.     Cervical back: Normal range of motion.  Skin:    General: Skin is warm and dry.  Neurological:     Mental Status: She is alert and oriented to person, place, and time.  Psychiatric:        Mood and Affect: Mood normal.        Behavior: Behavior normal.     Fetal Assessment 135 bpm, Mod Var, -Decels, +Accels Toco: Irregular Ctx  MAU Course   Results for orders placed or performed during the hospital encounter of 03/12/23 (from the past 24 hours)  Urinalysis, Routine w reflex microscopic -Urine, Clean Catch     Status: Abnormal   Collection Time: 03/12/23  1:46 AM  Result Value  Ref Range   Color, Urine YELLOW YELLOW   APPearance CLEAR CLEAR   Specific Gravity, Urine 1.015 1.005 - 1.030   pH 7.0 5.0 - 8.0   Glucose, UA NEGATIVE NEGATIVE mg/dL   Hgb urine dipstick NEGATIVE NEGATIVE   Bilirubin Urine NEGATIVE NEGATIVE   Ketones, ur NEGATIVE NEGATIVE mg/dL   Protein, ur 30 (A) NEGATIVE mg/dL   Nitrite NEGATIVE NEGATIVE   Leukocytes,Ua NEGATIVE NEGATIVE   RBC / HPF 0-5 0 - 5 RBC/hpf   WBC, UA 0-5 0 - 5 WBC/hpf   Bacteria, UA FEW (A) NONE SEEN   Squamous Epithelial / HPF 0-5 0 - 5 /HPF   No results found.  MDM PE Labs: UA EFM GBS Cultures Enema Pain medication Prescription Assessment and Plan  27 year old [id number]   SIUP at 35.6 weeks Cat I FT Contractions Nausea  -POC Reviewed -Exam performed and findings discussed. -Discussed management of constipation with enema. Patient agreeable.  Plan to send script to pharmacy for colace. -Patient offered and accepts pain medication. Give tylenol and flexeril now.  -Will also give reglan for nausea.  -GBS Swab collected and sent.  -NST reactive. -Monitor and reassess  Cherre Robins MSN, CNM 03/12/2023, 12:53 AM   Reassessment (3:34 AM) -Patient reports relief with interventions. -Discussed prescriptions.  Patient request additional prescription for "spitting and heartburn."  -Rx for robinul and Protonix sent to pharmacy on file.  -Will also send colace. -Reviewed precautions. -Given list of community providers to transfer care.  -Encouraged to return to MAU if symptoms worsen or with the onset of new symptoms. -Discharged to home in improved condition.  Cherre Robins MSN, CNM Advanced Practice Provider, Center for [employer]

## 2023-03-14 ENCOUNTER — Other Ambulatory Visit: Payer: Self-pay

## 2023-03-14 ENCOUNTER — Encounter (HOSPITAL_COMMUNITY): Payer: Self-pay | Admitting: Obstetrics & Gynecology

## 2023-03-14 ENCOUNTER — Inpatient Hospital Stay (HOSPITAL_COMMUNITY)
Admission: AD | Admit: 2023-03-14 | Discharge: 2023-03-15 | Disposition: A | Discharge status: Home / Self Care | Payer: Medicaid Other | Attending: Obstetrics & Gynecology | Admitting: Obstetrics & Gynecology

## 2023-03-14 DIAGNOSIS — O479 False labor, unspecified: Secondary | ICD-10-CM

## 2023-03-14 DIAGNOSIS — O4703 False labor before 37 completed weeks of gestation, third trimester: Secondary | ICD-10-CM | POA: Diagnosis present

## 2023-03-14 DIAGNOSIS — Z3A36 36 weeks gestation of pregnancy: Secondary | ICD-10-CM | POA: Diagnosis not present

## 2023-03-14 LAB — CULTURE, BETA STREP (GROUP B ONLY)

## 2023-03-14 LAB — URINALYSIS, ROUTINE W REFLEX MICROSCOPIC
Bilirubin Urine: NEGATIVE
Glucose, UA: NEGATIVE mg/dL
Hgb urine dipstick: NEGATIVE
Ketones, ur: NEGATIVE mg/dL
Leukocytes,Ua: NEGATIVE
Nitrite: NEGATIVE
Protein, ur: NEGATIVE mg/dL
Specific Gravity, Urine: 1.01 (ref 1.005–1.030)
pH: 7 (ref 5.0–8.0)

## 2023-03-14 LAB — WET PREP, GENITAL
Clue Cells Wet Prep HPF POC: NONE SEEN
Sperm: NONE SEEN
Trich, Wet Prep: NONE SEEN
WBC, Wet Prep HPF POC: <10 (ref <10)
Yeast Wet Prep HPF POC: NONE SEEN

## 2023-03-14 MED ORDER — LACTATED RINGERS IV BOLUS
1000.0000 mL | Freq: Once | INTRAVENOUS | Status: AC
  Administered 2023-03-14: 1000 mL via INTRAVENOUS

## 2023-03-14 MED ORDER — ACETAMINOPHEN 500 MG PO TABS
1000.0000 mg | ORAL_TABLET | Freq: Once | ORAL | Status: AC
  Administered 2023-03-14: 1000 mg via ORAL
  Filled 2023-03-14: qty 2

## 2023-03-14 MED ORDER — MORPHINE SULFATE (PF) 4 MG/ML IV SOLN
4.0000 mg | Freq: Once | INTRAVENOUS | Status: AC
  Administered 2023-03-14: 4 mg via INTRAMUSCULAR
  Filled 2023-03-14: qty 1

## 2023-03-14 MED ORDER — MORPHINE SULFATE (PF) 4 MG/ML IV SOLN
4.0000 mg | Freq: Once | INTRAVENOUS | Status: AC
Start: 2023-03-14 — End: 2023-03-14
  Administered 2023-03-14: 4 mg via INTRAVENOUS
  Filled 2023-03-14: qty 1

## 2023-03-14 NOTE — MAU Note (Signed)
.  Beverly Anthony is a 27 y.o. at [redacted]w[redacted]d here in MAU reporting having strong ctxs since was here 2 days ago. Lost some of mucous plug at 1000 and the rest of it this evening. Difficult to walk and some lower back pain. Denies VB or LOF and reports good FM   Onset of complaint: 2 days Pain score: 7 Vitals:   03/14/23 2005 03/14/23 2009  BP:  121/83  Pulse: 82   Resp: 18   Temp: 98.3 F (36.8 C)   SpO2: 99%      FHT:171 Lab orders placed from triage: labor eval

## 2023-03-14 NOTE — MAU Provider Note (Signed)
CTX, lost mucus plug    [name] is a 27 y.o. [phone] pregnant female at [redacted]w[redacted]d who presents to MAU today with complaint of CTX and lost mucous plug.  She states having strong ctx for 2 days, lost mucous plug 1000 12/24 and more this evening.  States hasn't had bowel movement in two days but hasn't picked up any of her prescriptions from pharmacy 12/22.  She states she's having difficulty walking and some LBP.  Denies VB, LOF.  +FM   Receives care at [facility]. Prenatal records reviewed.  Pertinent items noted in HPI and remainder of comprehensive ROS otherwise negative.   O BP 121/67   Pulse 100   Temp 98.3 F (36.8 C)   Resp 18   Ht 5\' 2"  (1.575 m)   Wt 104.8 kg   SpO2 99%   BMI 42.25 kg/m  Physical Exam Vitals and nursing note reviewed. Exam conducted with a chaperone present.  Constitutional:      General: She is not in acute distress.    Appearance: Normal appearance. She is not ill-appearing.  HENT:     Head: Normocephalic and atraumatic.     Nose: Nose normal.     Mouth/Throat:     Mouth: Mucous membranes are moist.     Pharynx: Oropharynx is clear.  Eyes:     Extraocular Movements: Extraocular movements intact.     Conjunctiva/sclera: Conjunctivae normal.  Cardiovascular:     Rate and Rhythm: Normal rate.  Pulmonary:     Effort: Pulmonary effort is normal. No respiratory distress.  Abdominal:     Comments: gravid  Genitourinary:    General: Normal vulva.     Rectum: Normal.     Comments: Cervical exam 2/50/-3 Musculoskeletal:        General: No swelling. Normal range of motion.     Cervical back: Normal range of motion.  Skin:    General: Skin is warm and dry.  Neurological:     Mental Status: She is alert and oriented to person, place, and time. Mental status is at baseline.  Psychiatric:        Mood and Affect: Mood normal.        Behavior: Behavior normal.      MDM: moderate MAU Course:  NST: 150bpm, moderate varb, +accels, no decels, no  contractions  Noted to originally have some fetal tachycardia that resolved w/o intervention shortly after being on NST.   Cervical exam showing 2/50/-3, some progress since last MAU visit on 12/22. Will collect UA, Wet prep, GC swabs.   GC collected  Wet prep negative  UA pending at time of sign out   Cervical exam showing 2.5/50/-3 and now painfully contracting q8-24mins.  Will give IV fluids and therapeutic rest Morphine 4 IM / 4 IV.    A&P: #[redacted] weeks gestation #PTL   Signed out to Dr. Leanora Cover on L&D given concerns for PTL.  She has assumed care at approximately 2200.    Hessie Dibble, MD 03/14/2023 8:55 PM

## 2023-03-15 DIAGNOSIS — O4703 False labor before 37 completed weeks of gestation, third trimester: Secondary | ICD-10-CM

## 2023-03-15 DIAGNOSIS — Z3A36 36 weeks gestation of pregnancy: Secondary | ICD-10-CM

## 2023-03-15 NOTE — MAU Provider Note (Signed)
None      S: Beverly Anthony is a 27 y.o. [phone] at [redacted]w[redacted]d  who presents to MAU today complaining contractions q 5 minutes since yesterday. She denies vaginal bleeding. She denies LOF. She reports normal fetal movement.    O: BP 121/67   Pulse 100   Temp 98.3 F (36.8 C)   Resp 18   Ht 5\' 2"  (1.575 m)   Wt 104.8 kg   SpO2 99%   BMI 42.25 kg/m  GENERAL: Well-developed, well-nourished female in no acute distress.  HEAD: Normocephalic, atraumatic.  CHEST: Normal effort of breathing, regular heart rate ABDOMEN: Soft, nontender, gravid  Cervical exam:  Dilation: 2.5 Effacement (%): 50 Cervical Position: Posterior Station: -3 Exam by:: Henrine Screws, RN   Fetal Monitoring: Baseline: 135 Variability: moderate Accelerations: present Decelerations: absent Contractions: q5-45min  A: SIUP at [redacted]w[redacted]d  False labor Discomfort improved after therapeutic rest with 4mg  IV & 4mg  IM morphine No cervical change on recheck  P: Discharge home Preterm labor and return precautions provided Continue scheduled prenatal care  Wyn Forster, MD 03/15/2023 12:05 AM

## 2023-03-16 LAB — GC/CHLAMYDIA PROBE AMP (~~LOC~~) NOT AT ARMC
Chlamydia: NEGATIVE
Comment: NEGATIVE
Comment: NORMAL
Neisseria Gonorrhea: NEGATIVE

## 2023-03-18 ENCOUNTER — Other Ambulatory Visit: Payer: Self-pay

## 2023-03-18 ENCOUNTER — Inpatient Hospital Stay (HOSPITAL_COMMUNITY)
Admission: AD | Admit: 2023-03-18 | Discharge: 2023-03-18 | Disposition: A | Discharge status: Home / Self Care | Payer: Medicaid Other | Attending: Obstetrics & Gynecology | Admitting: Obstetrics & Gynecology

## 2023-03-18 ENCOUNTER — Encounter (HOSPITAL_COMMUNITY): Payer: Self-pay | Admitting: Obstetrics & Gynecology

## 2023-03-18 DIAGNOSIS — O4703 False labor before 37 completed weeks of gestation, third trimester: Secondary | ICD-10-CM | POA: Insufficient documentation

## 2023-03-18 DIAGNOSIS — Z3A36 36 weeks gestation of pregnancy: Secondary | ICD-10-CM | POA: Insufficient documentation

## 2023-03-18 DIAGNOSIS — O479 False labor, unspecified: Secondary | ICD-10-CM

## 2023-03-18 NOTE — MAU Provider Note (Cosign Needed)
None      S: Beverly Anthony is a 27 y.o. [phone] at [redacted]w[redacted]d  who presents to MAU today complaining contractions q 10 minutes since initial eval earlier this week on 12/23. She denies vaginal bleeding. She denies LOF. She reports normal fetal movement.    O: BP 123/75 (BP Location: Right Arm)   Pulse 88   Temp 99.2 F (37.3 C) (Oral)   Resp 18   Ht 5\' 2"  (1.575 m)   Wt 99.8 kg   SpO2 100%   BMI 40.24 kg/m  Pt evaluated by RN  Cervical exam:  Dilation: 3 Effacement (%): 50 Cervical Position: Posterior Station: -3 Presentation: Vertex Exam by:: Ariel B. RN   Fetal Monitoring: Baseline: 135 Variability: mod Accelerations: present Decelerations: absent Contractions: every 5   A/P: SIUP at [redacted]w[redacted]d here in false labor Cat I strip Cervix unchanged Discharged home with return precautions   Sundra Aland, MD 03/18/2023 10:38 PM

## 2023-03-18 NOTE — MAU Note (Signed)
.  Beverly Anthony is a 27 y.o. at [redacted]w[redacted]d here in MAU reporting: complains of continues vaginal pressure, back pain, and contractions that has continued first visit earlier this week on 12/23. Patient states that sx has gotten worse since initial evaluation. Reports contraction frequency as every 10 minutes. States that it is now hard for her to walk. Came in by wheelchair. Denies LOF and VB. +FM.  Onset of complaint: Earlier this week, sx worsened over night. Pain score: Lower abdomen 7/10, lower back 5/10 Vitals:   03/18/23 1420  BP: 123/75  Pulse: 94  Resp: 18  Temp: 99.2 F (37.3 C)  SpO2: 100%     FHT:145 Lab orders placed from triage: Urine obtained

## 2023-03-20 ENCOUNTER — Inpatient Hospital Stay (HOSPITAL_COMMUNITY)
Admission: AD | Admit: 2023-03-20 | Discharge: 2023-03-20 | Disposition: A | Discharge status: Home / Self Care | Payer: Medicaid Other | Source: Home / Self Care | Attending: Obstetrics & Gynecology | Admitting: Obstetrics & Gynecology

## 2023-03-20 ENCOUNTER — Encounter: Payer: Self-pay | Admitting: Family Medicine

## 2023-03-20 ENCOUNTER — Encounter (HOSPITAL_COMMUNITY): Payer: Self-pay | Admitting: Obstetrics & Gynecology

## 2023-03-20 DIAGNOSIS — O471 False labor at or after 37 completed weeks of gestation: Secondary | ICD-10-CM

## 2023-03-20 DIAGNOSIS — Z3A37 37 weeks gestation of pregnancy: Secondary | ICD-10-CM | POA: Insufficient documentation

## 2023-03-20 DIAGNOSIS — O479 False labor, unspecified: Secondary | ICD-10-CM

## 2023-03-20 MED ORDER — FENTANYL CITRATE (PF) 100 MCG/2ML IJ SOLN
50.0000 ug | INTRAMUSCULAR | Status: DC | PRN
  Administered 2023-03-20: 100 ug via INTRAVENOUS
  Filled 2023-03-20: qty 2

## 2023-03-20 NOTE — MAU Provider Note (Cosign Needed)
None      S: Beverly Anthony is a 27 y.o. [phone] at [redacted]w[redacted]d  who presents to MAU today complaining contractions over the past 24 hours, became more intense. She endorses bloody show, but no vaginal bleeding. She denies LOF. She reports normal fetal movement.    O: BP 128/83   Pulse 81   Temp 98.7 F (37.1 C) (Oral)   Resp 18   Ht 5\' 2"  (1.575 m)   Wt 104.8 kg   SpO2 100%   BMI 42.25 kg/m  Evaluated by RN  Cervical exam:  Dilation: 3 (external os 4.5) Effacement (%): 50 Cervical Position: Posterior Station: -3 Presentation: Vertex Exam by:: Erle Crocker, RN   Fetal Monitoring: Baseline: 135 Variability: mod Accelerations: present Decelerations: absent Contractions: every 1-4 min, spaced out after fentanyl   A/P: SIUP at [redacted]w[redacted]d here in early latent labor Pt with multiple cx checks -- has not changed from 3-4cm and ctx spaced out NST reactive Pt requested discharge home Given labor and return precautions  Sundra Aland, MD 03/20/2023 8:21 PM

## 2023-03-20 NOTE — MAU Note (Deleted)
CRITICAL VALUE STICKER  CRITICAL VALUE:HGB 6.8  RECEIVER (on-site recipient of call):Elihu Milstein Andrey Cota  DATE & TIME NOTIFIED: 03/20/2023  1478  MESSENGER (representative from lab):Betha Loa CNM  MD NOTIFIED: Dorathy Daft CNM  TIME OF NOTIFICATION:0619  RESPONSE:  calling Dr.Eure

## 2023-03-20 NOTE — MAU Note (Signed)
.  [name] is a 27 y.o. at [redacted]w[redacted]d here in MAU reporting: contractions x 24 hours that became more intense this evening.   Pain score: 8 lower abdomen  ROM: Denies Vaginal Bleeding:spotting-pink Last SVE: 3cm Labor Pain Management Plan: Planning epidural and IV pain medication  Fetal Movement: Reports positive FM FHT:    Vitals:   03/20/23 0121  Pulse: 83  Resp: 18  Temp: 98.7 F (37.1 C)  SpO2: 100%     OB Office: Faculty GBS: Negative Lab orders placed from triage: MAU Labor Eval

## 2023-03-21 ENCOUNTER — Other Ambulatory Visit: Payer: Self-pay

## 2023-03-21 ENCOUNTER — Inpatient Hospital Stay (HOSPITAL_COMMUNITY): Payer: Medicaid Other | Admitting: Anesthesiology

## 2023-03-21 ENCOUNTER — Encounter (HOSPITAL_COMMUNITY): Payer: Self-pay | Admitting: Obstetrics and Gynecology

## 2023-03-21 ENCOUNTER — Inpatient Hospital Stay (HOSPITAL_COMMUNITY)
Admission: AD | Admit: 2023-03-21 | Discharge: 2023-03-23 | DRG: 806 | Disposition: A | Discharge status: Home / Self Care | Payer: Medicaid Other | Attending: Obstetrics and Gynecology | Admitting: Obstetrics and Gynecology

## 2023-03-21 DIAGNOSIS — O99214 Obesity complicating childbirth: Secondary | ICD-10-CM | POA: Diagnosis present

## 2023-03-21 DIAGNOSIS — D573 Sickle-cell trait: Secondary | ICD-10-CM | POA: Diagnosis present

## 2023-03-21 DIAGNOSIS — Z635 Disruption of family by separation and divorce: Secondary | ICD-10-CM | POA: Diagnosis not present

## 2023-03-21 DIAGNOSIS — E66813 Obesity, class 3: Secondary | ICD-10-CM | POA: Diagnosis present

## 2023-03-21 DIAGNOSIS — Z8249 Family history of ischemic heart disease and other diseases of the circulatory system: Secondary | ICD-10-CM | POA: Diagnosis not present

## 2023-03-21 DIAGNOSIS — O471 False labor at or after 37 completed weeks of gestation: Secondary | ICD-10-CM | POA: Diagnosis not present

## 2023-03-21 DIAGNOSIS — O9902 Anemia complicating childbirth: Secondary | ICD-10-CM | POA: Diagnosis present

## 2023-03-21 DIAGNOSIS — Z833 Family history of diabetes mellitus: Secondary | ICD-10-CM | POA: Diagnosis not present

## 2023-03-21 DIAGNOSIS — O4292 Full-term premature rupture of membranes, unspecified as to length of time between rupture and onset of labor: Secondary | ICD-10-CM | POA: Diagnosis present

## 2023-03-21 DIAGNOSIS — O26893 Other specified pregnancy related conditions, third trimester: Secondary | ICD-10-CM | POA: Diagnosis present

## 2023-03-21 DIAGNOSIS — Z3A37 37 weeks gestation of pregnancy: Secondary | ICD-10-CM

## 2023-03-21 DIAGNOSIS — A6000 Herpesviral infection of urogenital system, unspecified: Secondary | ICD-10-CM | POA: Diagnosis present

## 2023-03-21 DIAGNOSIS — O4202 Full-term premature rupture of membranes, onset of labor within 24 hours of rupture: Secondary | ICD-10-CM | POA: Diagnosis not present

## 2023-03-21 DIAGNOSIS — O9832 Other infections with a predominantly sexual mode of transmission complicating childbirth: Secondary | ICD-10-CM | POA: Diagnosis present

## 2023-03-21 LAB — CBC
HCT: 33.7 % — ABNORMAL LOW (ref 36.0–46.0)
Hemoglobin: 11.6 g/dL — ABNORMAL LOW (ref 12.0–15.0)
MCH: 26.9 pg (ref 26.0–34.0)
MCHC: 34.4 g/dL (ref 30.0–36.0)
MCV: 78 fL — ABNORMAL LOW (ref 80.0–100.0)
Platelets: 246 10*3/uL (ref 150–400)
RBC: 4.32 MIL/uL (ref 3.87–5.11)
RDW: 14.8 % (ref 11.5–15.5)
WBC: 10.5 10*3/uL (ref 4.0–10.5)
nRBC: 0 % (ref 0.0–0.2)

## 2023-03-21 LAB — RPR: RPR Ser Ql: NONREACTIVE

## 2023-03-21 LAB — POCT FERN TEST: POCT Fern Test: POSITIVE

## 2023-03-21 LAB — TYPE AND SCREEN
ABO/RH(D): B POS
Antibody Screen: NEGATIVE

## 2023-03-21 MED ORDER — ONDANSETRON HCL 4 MG PO TABS: 4.0000 mg | ORAL_TABLET | ORAL | Status: DC | PRN

## 2023-03-21 MED ORDER — COCONUT OIL OIL: 1.0000 | TOPICAL_OIL | Status: DC | PRN

## 2023-03-21 MED ORDER — EPHEDRINE 5 MG/ML INJ: 10.0000 mg | INTRAVENOUS | Status: DC | PRN

## 2023-03-21 MED ORDER — DIPHENHYDRAMINE HCL 50 MG/ML IJ SOLN: 12.5000 mg | INTRAMUSCULAR | Status: DC | PRN

## 2023-03-21 MED ORDER — LIDOCAINE HCL (PF) 1 % IJ SOLN: 30.0000 mL | INTRAMUSCULAR | Status: DC | PRN

## 2023-03-21 MED ORDER — OXYCODONE HCL 5 MG PO TABS
5.0000 mg | ORAL_TABLET | ORAL | Status: DC | PRN
  Administered 2023-03-21: 5 mg via ORAL
  Filled 2023-03-21: qty 1

## 2023-03-21 MED ORDER — WITCH HAZEL-GLYCERIN EX PADS
1.0000 | MEDICATED_PAD | CUTANEOUS | Status: DC | PRN
  Administered 2023-03-21: 1 via TOPICAL

## 2023-03-21 MED ORDER — ACETAMINOPHEN 325 MG PO TABS
650.0000 mg | ORAL_TABLET | ORAL | Status: DC | PRN
Start: 2023-03-21 — End: 2023-03-21

## 2023-03-21 MED ORDER — SOD CITRATE-CITRIC ACID 500-334 MG/5ML PO SOLN
30.0000 mL | ORAL | Status: DC | PRN
Start: 2023-03-21 — End: 2023-03-21

## 2023-03-21 MED ORDER — OXYTOCIN-SODIUM CHLORIDE 30-0.9 UT/500ML-% IV SOLN
2.5000 [IU]/h | INTRAVENOUS | Status: DC
  Administered 2023-03-21: 2.5 [IU]/h via INTRAVENOUS
  Filled 2023-03-21: qty 500

## 2023-03-21 MED ORDER — LACTATED RINGERS IV SOLN: 500.0000 mL | INTRAVENOUS | Status: DC | PRN

## 2023-03-21 MED ORDER — SENNOSIDES-DOCUSATE SODIUM 8.6-50 MG PO TABS
2.0000 | ORAL_TABLET | Freq: Every day | ORAL | Status: DC
  Administered 2023-03-22 – 2023-03-23 (×2): 2 via ORAL
  Filled 2023-03-21 (×2): qty 2

## 2023-03-21 MED ORDER — PHENYLEPHRINE 80 MCG/ML (10ML) SYRINGE FOR IV PUSH (FOR BLOOD PRESSURE SUPPORT): 80.0000 ug | PREFILLED_SYRINGE | INTRAVENOUS | Status: DC | PRN

## 2023-03-21 MED ORDER — DIPHENHYDRAMINE HCL 25 MG PO CAPS
25.0000 mg | ORAL_CAPSULE | Freq: Four times a day (QID) | ORAL | Status: DC | PRN
Start: 2023-03-21 — End: 2023-03-23

## 2023-03-21 MED ORDER — OXYTOCIN BOLUS FROM INFUSION
333.0000 mL | Freq: Once | INTRAVENOUS | Status: AC
  Administered 2023-03-21: 333 mL via INTRAVENOUS

## 2023-03-21 MED ORDER — DIBUCAINE (PERIANAL) 1 % EX OINT
1.0000 | TOPICAL_OINTMENT | CUTANEOUS | Status: DC | PRN
  Administered 2023-03-21: 1 via RECTAL
  Filled 2023-03-21: qty 28

## 2023-03-21 MED ORDER — ACETAMINOPHEN 325 MG PO TABS
650.0000 mg | ORAL_TABLET | ORAL | Status: DC | PRN
  Administered 2023-03-22: 650 mg via ORAL
  Filled 2023-03-21: qty 2

## 2023-03-21 MED ORDER — SIMETHICONE 80 MG PO CHEW: 80.0000 mg | CHEWABLE_TABLET | ORAL | Status: DC | PRN

## 2023-03-21 MED ORDER — LACTATED RINGERS IV SOLN: INTRAVENOUS | Status: DC

## 2023-03-21 MED ORDER — ONDANSETRON HCL 4 MG/2ML IJ SOLN: 4.0000 mg | INTRAMUSCULAR | Status: DC | PRN

## 2023-03-21 MED ORDER — FENTANYL CITRATE (PF) 100 MCG/2ML IJ SOLN
50.0000 ug | INTRAMUSCULAR | Status: DC | PRN
  Administered 2023-03-21: 100 ug via INTRAVENOUS
  Administered 2023-03-21: 50 ug via INTRAVENOUS
  Filled 2023-03-21 (×2): qty 2

## 2023-03-21 MED ORDER — IBUPROFEN 600 MG PO TABS
600.0000 mg | ORAL_TABLET | Freq: Four times a day (QID) | ORAL | Status: DC
Start: 2023-03-21 — End: 2023-03-23
  Administered 2023-03-21 – 2023-03-23 (×8): 600 mg via ORAL
  Filled 2023-03-21 (×8): qty 1

## 2023-03-21 MED ORDER — TERBUTALINE SULFATE 1 MG/ML IJ SOLN
INTRAMUSCULAR | Status: AC
  Filled 2023-03-21: qty 1

## 2023-03-21 MED ORDER — OXYCODONE-ACETAMINOPHEN 5-325 MG PO TABS: 1.0000 | ORAL_TABLET | ORAL | Status: DC | PRN

## 2023-03-21 MED ORDER — LIDOCAINE-EPINEPHRINE (PF) 1.5 %-1:200000 IJ SOLN
INTRAMUSCULAR | Status: DC | PRN
  Administered 2023-03-21: 5 mL via EPIDURAL

## 2023-03-21 MED ORDER — LACTATED RINGERS IV SOLN
500.0000 mL | Freq: Once | INTRAVENOUS | Status: AC
  Administered 2023-03-21: 500 mL via INTRAVENOUS

## 2023-03-21 MED ORDER — ONDANSETRON HCL 4 MG/2ML IJ SOLN: 4.0000 mg | Freq: Four times a day (QID) | INTRAMUSCULAR | Status: DC | PRN

## 2023-03-21 MED ORDER — TERBUTALINE SULFATE 1 MG/ML IJ SOLN
0.2500 mg | Freq: Once | INTRAMUSCULAR | Status: AC | PRN
Start: 2023-03-21 — End: 2023-03-21
  Administered 2023-03-21: 0.25 mg via SUBCUTANEOUS
  Filled 2023-03-21: qty 1

## 2023-03-21 MED ORDER — OXYCODONE-ACETAMINOPHEN 5-325 MG PO TABS: 2.0000 | ORAL_TABLET | ORAL | Status: DC | PRN

## 2023-03-21 MED ORDER — OXYCODONE HCL 5 MG PO TABS: 10.0000 mg | ORAL_TABLET | ORAL | Status: DC | PRN

## 2023-03-21 MED ORDER — OXYTOCIN-SODIUM CHLORIDE 30-0.9 UT/500ML-% IV SOLN
1.0000 m[IU]/min | INTRAVENOUS | Status: DC
  Administered 2023-03-21 (×2): 2 m[IU]/min via INTRAVENOUS

## 2023-03-21 MED ORDER — FENTANYL-BUPIVACAINE-NACL 0.5-0.125-0.9 MG/250ML-% EP SOLN
12.0000 mL/h | EPIDURAL | Status: DC | PRN
  Administered 2023-03-21: 12 mL/h via EPIDURAL
  Filled 2023-03-21: qty 250

## 2023-03-21 MED ORDER — BENZOCAINE-MENTHOL 20-0.5 % EX AERO
1.0000 | INHALATION_SPRAY | CUTANEOUS | Status: DC | PRN
  Administered 2023-03-21: 1 via TOPICAL
  Filled 2023-03-21: qty 56

## 2023-03-21 MED ORDER — PRENATAL MULTIVITAMIN CH
1.0000 | ORAL_TABLET | Freq: Every day | ORAL | Status: DC
  Administered 2023-03-22 – 2023-03-23 (×2): 1 via ORAL
  Filled 2023-03-21 (×2): qty 1

## 2023-03-21 NOTE — Progress Notes (Signed)
 OB/GYN Faculty Practice Delivery Note  Beverly Anthony is a 27 y.o. [id number] s/p vaginal delivery at [redacted]w[redacted]d. She was admitted for ROM at 2200 last night.   ROM: 13h 13m with clear fluid GBS Status: negative Maximum Maternal Temperature: 98.2   Labor Progress: Patient was augmented with pitocin  for ineffective contraction pattern and SROM. Patient progressed to complete.  Delivery Date/Time: 03/21/23 at 1255 Delivery: Called to room and patient was complete and pushing. Head delivered spontaneously. No nuchal cord present. Shoulder and body delivered in usual fashion. Infant with spontaneous cry, placed on mother's abdomen, dried and stimulated. Cord clamped x 2 after 1-minute delay, and cut by father of the baby. Cord blood drawn and collected. Placenta delivered spontaneously with gentle cord traction and maternal pushing efforts. Fundus firm with massage and Pitocin  was started. Labia, perineum, vagina, and cervix inspected inspected with vaginal sweep and were intact.   Placenta: delivered spontaneously Complications: none Lacerations: none EBL: 253 Analgesia: epidural  Postpartum Planning [ X] message to sent to schedule follow-up  [X} transfer orders placed [X]  List updated  Infant: female  APGARs pending  weight pending  Beverly Anthony, Student-MidWife  03/21/2023 1:48 PM

## 2023-03-21 NOTE — Anesthesia Procedure Notes (Signed)
 Epidural Patient location during procedure: OB Start time: 03/21/2023 5:41 AM End time: 03/21/2023 5:58 AM  Staffing Anesthesiologist: Darlyn Rush, MD Performed: anesthesiologist   Preanesthetic Checklist Completed: patient identified, IV checked, risks and benefits discussed, monitors and equipment checked, pre-op evaluation and timeout performed  Epidural Patient position: sitting Prep: DuraPrep and site prepped and draped Patient monitoring: heart rate, continuous pulse ox and blood pressure Approach: midline Location: L3-L4 Injection technique: LOR air and LOR saline  Needle:  Needle type: Tuohy  Needle gauge: 17 G Needle length: 9 cm Needle insertion depth: 6 cm Catheter type: closed end flexible Catheter size: 19 Gauge Catheter at skin depth: 12 cm Test dose: negative and 1.5% lidocaine  with Epi 1:200 K  Assessment Sensory level: T8 Events: blood not aspirated, no cerebrospinal fluid, injection not painful, no injection resistance, no paresthesia and negative IV test  Additional Notes Reason for block:procedure for pain

## 2023-03-21 NOTE — Anesthesia Postprocedure Evaluation (Signed)
 Anesthesia Post Note  Patient: Beverly Anthony  Procedure(s) Performed: AN AD HOC LABOR EPIDURAL     Patient location during evaluation: Mother Baby Anesthesia Type: Epidural Level of consciousness: awake and alert Pain management: pain level controlled Vital Signs Assessment: post-procedure vital signs reviewed and stable Respiratory status: spontaneous breathing, nonlabored ventilation and respiratory function stable Cardiovascular status: stable Postop Assessment: no headache, no backache and epidural receding Anesthetic complications: no   No notable events documented.  Last Vitals:  Vitals:   03/21/23 1545 03/21/23 1645  BP: 119/74 127/75  Pulse: 86 87  Resp: 18 20  Temp: 36.6 C 36.7 C  SpO2: 100% 100%    Last Pain:  Vitals:   03/21/23 1645  TempSrc: Oral  PainSc: 2    Pain Goal:                Epidural/Spinal Function Cutaneous sensation: Able to Wiggle Toes (03/21/23 1645), Patient able to flex knees: Yes (03/21/23 1645), Patient able to lift hips off bed: Yes (03/21/23 1645), Back pain beyond tenderness at insertion site: No (03/21/23 1645), Progressively worsening motor and/or sensory loss: No (03/21/23 1645), Bowel and/or bladder incontinence post epidural: No (03/21/23 1645)  Boneta Standre

## 2023-03-21 NOTE — Progress Notes (Addendum)
 Labor Progress Note Beverly Anthony is a 27 y.o. [phone] at [redacted]w[redacted]d presented for labor management after SROM  S: Called to see patient due to recurrent late/deep variable decelerations. On my arrival, IV fluid bolus was running, Pitocin  discontinued, repositioning attempts being made. Patient actively contracting though not feeling much with epidural in place. Cervical exam by RN unchanged from prior exam. EFM with deep late decelerations with contractions with nadir to 80s-90s but with return to baseline and reassuring variability between contractions. Terbutaline  given. Contractions started spacing out.   O:  BP 108/66   Pulse 76   Temp 98 F (36.7 C) (Oral)   Resp 18   SpO2 98%  EFM: Baseline 145/Mod variability/accels absent/Recurrent late & variable decelerations  CVE: Dilation: 4.5 Effacement (%): 50 Cervical Position: Posterior Station: -2 Presentation: Vertex Exam by:: Darryle Kendall, RN   A&P: 27 y.o. [phone] [redacted]w[redacted]d here for labor management after SROM #Labor: s/p Terbutaline , stopped Pit w improvement of FHR  will stay off Pitocin  for now, allow for fetal recovery  if strip improves, will restart Pitocin  #Pain: Comfortable with epidural #FWB: Cat II tracing -- decels improved as above, tracing improved w above measures #GBS negative   Lang KATHEE Mannheim, MD 7:54 AM   Attestation of Supervision of Resident:  I confirm that I have verified the information documented in the resident's note and that I have also personally reperformed the history, physical exam and all medical decision making activities.  I have verified that all services and findings are accurately documented in this resident's note; and I agree with management and plan as outlined in the documentation. I have also made any necessary editorial changes.   Alain Sor, MD OB Fellow, Faculty Practice [facility], Center for [facility]

## 2023-03-21 NOTE — MAU Note (Addendum)
 Beverly Anthony is a 27 y.o. at [redacted]w[redacted]d here in MAU reporting: Patient reports her water broke at 11:40pm. She reports it was a gush of clear fluid and she's continuing to leak. She reports irregular ctx, +FM, and denies VB.    Onset of complaint: 2340 Pain score: 0/10 Vitals:   03/21/23 0026 03/21/23 0030  BP: 122/81 130/77  Pulse: 97 98  Resp: 17   Temp: 98.4 F (36.9 C)      FHT:145 Lab orders placed from triage: MAU Labor

## 2023-03-21 NOTE — Anesthesia Preprocedure Evaluation (Signed)
 Anesthesia Evaluation  Patient identified by MRN, date of birth, ID band Patient awake    Reviewed: Allergy & Precautions, H&P , Patient's Chart, lab work & pertinent test results  Airway Mallampati: II  TM Distance: >3 FB Neck ROM: Full    Dental no notable dental hx.    Pulmonary neg pulmonary ROS   Pulmonary exam normal breath sounds clear to auscultation       Cardiovascular negative cardio ROS Normal cardiovascular exam Rhythm:Regular Rate:Normal     Neuro/Psych negative neurological ROS  negative psych ROS   GI/Hepatic negative GI ROS, Neg liver ROS,,,  Endo/Other    Class 3 obesity  Renal/GU negative Renal ROS     Musculoskeletal negative musculoskeletal ROS (+)    Abdominal  (+) + obese  Peds  Hematology  (+) Blood dyscrasia, Sickle cell trait and anemia   Anesthesia Other Findings   Reproductive/Obstetrics (+) Pregnancy                             Anesthesia Physical Anesthesia Plan  ASA: 3  Anesthesia Plan: Epidural   Post-op Pain Management:    Induction:   PONV Risk Score and Plan:   Airway Management Planned: Natural Airway  Additional Equipment: None  Intra-op Plan:   Post-operative Plan:   Informed Consent: I have reviewed the patients History and Physical, chart, labs and discussed the procedure including the risks, benefits and alternatives for the proposed anesthesia with the patient or authorized representative who has indicated his/her understanding and acceptance.       Plan Discussed with:   Anesthesia Plan Comments: (Labs reviewed. Platelets acceptable, patient not taking any blood thinning medications. Risks and benefits discussed with patient, patient expressed understanding and wished to proceed.)        Anesthesia Quick Evaluation

## 2023-03-21 NOTE — H&P (Addendum)
 OBSTETRIC ADMISSION HISTORY AND PHYSICAL  Beverly Anthony is a 27 y.o. female [phone] with IUP at [redacted]w[redacted]d by LMP presenting for labor management after SROM. She reports +FMs, No LOF, no VB, no blurry vision, headaches or peripheral edema, and RUQ pain.  She plans on breast and bottle feeding. She request BTL for birth control. She received her prenatal care at  [facility]    Dating: By LMP --->  Estimated Date of Delivery: 04/10/23  Sono:    @[redacted]w[redacted]d , CWD, normal anatomy, cephalic presentation, posterior placenta, EFW 2475g 31%   Prenatal History/Complications: insufficient prenatal care  Past Medical History: Past Medical History:  Diagnosis Date   Obesity    UTI (urinary tract infection)     Past Surgical History: Past Surgical History:  Procedure Laterality Date   EXTERNAL EAR SURGERY Bilateral 2017   keloid removal   INDUCED ABORTION     keloid     removal on ear    Obstetrical History: OB History     Gravida  7   Para  3   Term  3   Preterm  0   AB  3   Living  3      SAB  1   IAB  2   Ectopic  0   Multiple  0   Live Births  3           Social History Social History   Socioeconomic History   Marital status: Legally Separated    Spouse name: Not on file   Number of children: Not on file   Years of education: Not on file   Highest education level: Not on file  Occupational History   Not on file  Tobacco Use   Smoking status: Never   Smokeless tobacco: Never  Vaping Use   Vaping status: Never Used  Substance and Sexual Activity   Alcohol use: No   Drug use: No   Sexual activity: Yes  Other Topics Concern   Not on file  Social History Narrative   Not on file   Social Drivers of Health   Financial Resource Strain: Not on file  Food Insecurity: No Food Insecurity (03/21/2023)   Hunger Vital Sign    Worried About Running Out of Food in the Last Year: Never true    Ran Out of Food in the Last Year: Never true   Transportation Needs: No Transportation Needs (03/21/2023)   PRAPARE - Administrator, Civil Service (Medical): No    Lack of Transportation (Non-Medical): No  Physical Activity: Not on file  Stress: Not on file  Social Connections: Not on file    Family History: Family History  Problem Relation Age of Onset   Cancer Paternal Grandfather    Diabetes Father    Hypertension Father     Allergies: No Known Allergies  Medications Prior to Admission  Medication Sig Dispense Refill Last Dose/Taking   famotidine  (PEPCID ) 20 MG tablet Take 20 mg by mouth daily.   03/20/2023 at  8:00 PM   ondansetron  (ZOFRAN ) 4 MG tablet Take 4 mg by mouth every 8 (eight) hours as needed for nausea or vomiting.   03/20/2023 at  4:00 PM   docusate sodium  (COLACE) 100 MG capsule Take 1 capsule (100 mg total) by mouth 2 (two) times daily. 60 capsule 0    glycopyrrolate  (ROBINUL ) 1 MG tablet Take 1 tablet (1 mg total) by mouth 3 (three) times daily as needed. 90 tablet  0    pantoprazole  (PROTONIX ) 20 MG tablet Take 1 tablet (20 mg total) by mouth daily. 30 tablet 1      Review of Systems   All systems reviewed and negative except as stated in HPI  Blood pressure 116/72, pulse 71, temperature 97.9 F (36.6 C), resp. rate 18, unknown if currently breastfeeding. General appearance: alert, cooperative, and no distress Lungs: clear to auscultation bilaterally Heart: regular rate and rhythm Abdomen: soft, non-tender; bowel sounds normal Pelvic: As below Extremities: Homans sign is negative, no sign of DVT Presentation: cephalic Fetal monitoring: Baseline: 135 bpm, Variability: Good {> 6 bpm), Accelerations: Non-reactive but appropriate for gestational age, and Decelerations: Absent Uterine activity: UI Dilation: 4.5 Effacement (%): 50 Station: -2 Exam by:: Darryle Kendall, RN   Prenatal labs: ABO, Rh: --/--/B POS (12/31 0037) Antibody: NEG (12/31 0037) Rubella:   RPR:    HBsAg:    HIV:     GBS: Negative/-- (12/22 0000)  1 hr Glucola wnl Genetic screening  low risk Anatomy US  wnl  Prenatal Transfer Tool  Maternal Diabetes: No Genetic Screening: Normal Maternal Ultrasounds/Referrals: Normal Fetal Ultrasounds or other Referrals:  None Maternal Substance Abuse:  No Significant Maternal Medications:  Antiemetics PRN Significant Maternal Lab Results:  Group B Strep negative Number of Prenatal Visits:greater than 3 verified prenatal visits Other Comments:  None  Results for orders placed or performed during the hospital encounter of 03/21/23 (from the past 24 hours)  POCT fern test   Collection Time: 03/21/23 12:27 AM  Result Value Ref Range   POCT Fern Test Positive = ruptured amniotic membanes   CBC   Collection Time: 03/21/23 12:37 AM  Result Value Ref Range   WBC 10.5 4.0 - 10.5 K/uL   RBC 4.32 3.87 - 5.11 MIL/uL   Hemoglobin 11.6 (L) 12.0 - 15.0 g/dL   HCT 66.2 (L) 63.9 - 53.9 %   MCV 78.0 (L) 80.0 - 100.0 fL   MCH 26.9 26.0 - 34.0 pg   MCHC 34.4 30.0 - 36.0 g/dL   RDW 85.1 88.4 - 84.4 %   Platelets 246 150 - 400 K/uL   nRBC 0.0 0.0 - 0.2 %  Type and screen [facility]   Collection Time: 03/21/23 12:37 AM  Result Value Ref Range   ABO/RH(D) B POS    Antibody Screen NEG    Sample Expiration      03/24/2023,2359 Performed at [facility], [address], [place], [place] [address]     Patient Active Problem List   Diagnosis Date Noted   Normal labor 03/21/2023   GBS bacteriuria 01/18/2020   Post-dates pregnancy 09/02/2017   Spontaneous vaginal delivery 09/02/2017   Unwanted fertility 08/15/2017   Supervision of normal pregnancy 04/28/2017   Gallbladder stone without cholecystitis or obstruction 02/09/2015   Sickle cell trait (HCC) 01/10/2015    Assessment/Plan:  Beverly Anthony is a 27 y.o. H2E6966 at [redacted]w[redacted]d here for labor management after spontaneous rupture of membranes  #Labor: Latent labor. Start Pitocin .   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  I have verified that all services and findings are accurately documented in this resident's note; and I agree with management and plan as outlined in the documentation. I have also made any necessary editorial changes.  Alain Sor, MD OB Fellow, Faculty Practice [facility], Center for [facility]

## 2023-03-21 NOTE — Discharge Summary (Signed)
 Postpartum Discharge Summary      Patient Name: Beverly Anthony DOB: 08-12-1995 MRN: [id number]  Date of admission: 03/21/2023 Delivery date:03/21/2023 Delivering provider: CLEATUS MOCCASIN Date of discharge: 03/23/2023  Admitting diagnosis: Normal labor [O80, Z37.9] Intrauterine pregnancy: [redacted]w[redacted]d     Secondary diagnosis:  Active Problems:   Normal labor  Additional problems: none    Discharge diagnosis: Term Pregnancy Delivered                                              Post partum procedures: none Augmentation: Pitocin  Complications: ROM>24 hours  Hospital course: Onset of Labor With Vaginal Delivery      27 y.o. yo [id number] at [redacted]w[redacted]d was admitted in Active Labor on 03/21/2023. Labor course was complicated by prolonged ROM.  Membrane Rupture Time/Date: 11:40 PM,03/20/2023  Delivery Method:Vaginal, Spontaneous Operative Delivery:N/A Episiotomy: None Lacerations:  None Patient had a postpartum course complicated by nothing.  She is ambulating, tolerating a regular diet, passing flatus, and urinating well. Patient is discharged home in stable condition on 03/23/23.  Newborn Data: Birth date:03/21/2023 Birth time:12:55 PM Gender:Female Living status:Living Apgars:8 ,9  Weight:3230 g  Magnesium Sulfate received: No BMZ received: No Rhophylac:N/A MMR:N/A T-DaP:Given prenatally Flu: No RSV Vaccine received: No Transfusion:No  Immunizations received: Immunization History  Administered Date(s) Administered   Influenza,inj,Quad PF,6+ Mos 01/20/2015   Tdap 01/20/2015, 06/19/2017   Physical exam  Vitals:   03/22/23 0544 03/22/23 1245 03/22/23 2238 03/23/23 0520  BP: 111/60 126/86 111/66 95/65  Pulse: 92 91 95 75  Resp: 18 18 18 18   Temp: (!) 97.5 F (36.4 C) 98 F (36.7 C) 97.8 F (36.6 C) 97.7 F (36.5 C)  TempSrc: Oral Oral Oral Oral  SpO2: 99% 98% 99% 99%   General: alert, cooperative, and no distress Lochia: appropriate Uterine Fundus: firm DVT Evaluation:  No evidence of DVT seen on physical exam. Labs: Lab Results  Component Value Date   WBC 10.5 03/21/2023   HGB 11.6 (L) 03/21/2023   HCT 33.7 (L) 03/21/2023   MCV 78.0 (L) 03/21/2023   PLT 246 03/21/2023      Latest Ref Rng & Units 06/18/2020    8:22 PM  CMP  Glucose 70 - 99 mg/dL 83   BUN 6 - 20 mg/dL 5   Creatinine 9.55 - 8.99 mg/dL 9.10   Sodium 864 - 854 mmol/L 134   Potassium 3.5 - 5.1 mmol/L 3.7   Chloride 98 - 111 mmol/L 102   CO2 22 - 32 mmol/L 25   Calcium 8.9 - 10.3 mg/dL 9.1   Total Protein 6.5 - 8.1 g/dL 7.2   Total Bilirubin 0.3 - 1.2 mg/dL 0.1   Alkaline Phos 38 - 126 U/L 82   AST 15 - 41 U/L 18   ALT 0 - 44 U/L 16    Edinburgh Score:    03/21/2023    3:45 PM  Edinburgh Postnatal Depression Scale Screening Tool  I have been able to laugh and see the funny side of things. --   No data recorded  After visit meds:  Allergies as of 03/23/2023   No Known Allergies      Medication List     TAKE these medications    docusate sodium  100 MG capsule Commonly known as: COLACE Take 1 capsule (100 mg total) by mouth 2 (two) times daily.   famotidine   20 MG tablet Commonly known as: PEPCID  Take 20 mg by mouth daily.   glycopyrrolate  1 MG tablet Commonly known as: Robinul  Take 1 tablet (1 mg total) by mouth 3 (three) times daily as needed.   ibuprofen  600 MG tablet Commonly known as: ADVIL  Take 1 tablet (600 mg total) by mouth every 6 (six) hours.   ondansetron  4 MG tablet Commonly known as: ZOFRAN  Take 4 mg by mouth every 8 (eight) hours as needed for nausea or vomiting.   pantoprazole  20 MG tablet Commonly known as: Protonix  Take 1 tablet (20 mg total) by mouth daily.       Discharge home in stable condition Infant Feeding: Bottle and Breast Infant Disposition:home with mother Discharge instruction: per After Visit Summary and Postpartum booklet. Activity: Advance as tolerated. Pelvic rest for 6 weeks.  Diet: routine diet Future  Appointments:No future appointments.  Follow up Visit:  Follow-up Information     [facility] up in 4 week(s).   Specialty: Obstetrics and Gynecology Why: pp check, they will call you with an appointment Contact information: [address] [place]  [address] [phone]               Message sent to [facility] Please schedule this patient for a In person postpartum visit in 4 weeks with the following provider: Any provider. Additional Postpartum F/U: Late PNC, then all completed at [facility]   Low risk pregnancy complicated by:  limited [facility] Delivery mode:  Vaginal, Spontaneous Anticipated Birth Control:   Desires BTL, last consent on file for [facility] is 06/19/17   03/23/2023 Glenys GORMAN Birk, MD

## 2023-03-21 NOTE — Progress Notes (Signed)
Encounter created in error

## 2023-03-21 NOTE — Progress Notes (Signed)
 Beverly Anthony is a 27 y.o. [phone] at [redacted]w[redacted]d by LMP admitted for rupture of membranes  Subjective: Patient resting comfortably with epidural.  Objective: BP 116/61   Pulse (!) 107   Temp 98 F (36.7 C) (Oral)   Resp 18   SpO2 100%  No intake/output data recorded. No intake/output data recorded.  FHT:  FHR: 140 bpm, variability: moderate,  accelerations:  Present,  decelerations:  Absent UC:   irregular, every 2-6 minutes SVE:   Dilation: 4.5 Effacement (%): 80 Station: -2 Exam by:: [place]: Lab Results  Component Value Date   WBC 10.5 03/21/2023   HGB 11.6 (L) 03/21/2023   HCT 33.7 (L) 03/21/2023   MCV 78.0 (L) 03/21/2023   PLT 246 03/21/2023    Assessment / Plan: Augmentation of labor, progressing well  Labor:  Restart pitocin  for ineffective contraction patten Preeclampsia:   BP's stable Fetal Wellbeing:  Category I Pain Control:  Epidural I/D:   GBS neg Anticipated MOD:  NSVD  Joesph KATHEE Gouge, Student-MidWife 03/21/2023, 9:08 AM

## 2023-03-22 NOTE — Progress Notes (Signed)
 POSTPARTUM PROGRESS NOTE  Subjective: Beverly Anthony is a 28 y.o. H2E5965 s/p SVD at [redacted]w[redacted]d.  She reports she is doing well. No acute events overnight. She denies any problems with ambulating, voiding or po intake. Denies nausea or vomiting. She has  passed flatus. Pain is moderately controlled.  Lochia is somewhat heavier than menses but improving.  Objective: Blood pressure 111/60, pulse 92, temperature (!) 97.5 F (36.4 C), temperature source Oral, resp. rate 18, SpO2 99%, unknown if currently breastfeeding.  Physical Exam:  General: alert, cooperative and no distress Chest: no respiratory distress Abdomen: soft, non-tender  Uterine Fundus: firm and at level of umbilicus Extremities: No calf swelling or tenderness  Trace edema  Recent Labs    03/21/23 0037  HGB 11.6*  HCT 33.7*    Assessment/Plan: Beverly Anthony is a 28 y.o. H2E5965 s/p SVD at [redacted]w[redacted]d  Routine Postpartum Care: Doing well, pain well-controlled.  -- Continue routine care, lactation support  -- Contraception: interval BTL -- Feeding: breast and bottle  Dispo: Plan for discharge tomorrow.  Beverly Anthony B, MD 03/22/2023 8:53 AM

## 2023-03-22 NOTE — Lactation Note (Signed)
 This note was copied from a baby's chart. Lactation Consultation Note  Patient Name: Boy Marissah Landrigan Today's Date: 03/22/2023 Age:28 hours Reason for consult: Initial assessment;Early term 27-38.6wks  P4, Mother is breastfeeding and supplementing with her milk and formula.   Mother has pumped 10 ml with last session and is concerned she does not have enough breastmilk.  Praised her for her efforts and provided education that volume is good. Gave mother volume guidelines increasing per day of life and as baby desires. Feed on demand with cues.  Goal 8-12+ times per day after first 24 hrs.  Wake baby if he does not wake after 3 hours.  Place him skin to skin. Checked flange size since mother thought 21 mm was too large.  Provided 18 mm flanges. Did not see a feeding but mother knows to call.  Maternal Data Has patient been taught Hand Expression?: Yes Does the patient have breastfeeding experience prior to this delivery?: Yes How long did the patient breastfeed?: 2-3 mos.  Feeding Mother's Current Feeding Choice: Breast Milk and Formula  Lactation Tools Discussed/Used Tools: Pump;Flanges Flange Size: 18 Breast pump type: Double-Electric Breast Pump Pump Education: Setup, frequency, and cleaning;Milk Storage Reason for Pumping: LPI Pumping frequency: q 3 hours for 15 min. Pumped volume: 10 mL  Interventions Interventions: DEBP;Education  Discharge Pump: Advised to call insurance company (Provided [employer] phone number)  Consult Status Consult Status: Follow-up Date: 03/23/23 Follow-up type: In-patient    Shannon Dines Boschen  RN, IBCLC 03/22/2023, 12:00 PM

## 2023-03-23 MED ORDER — IBUPROFEN 600 MG PO TABS: 600.0000 mg | ORAL_TABLET | Freq: Four times a day (QID) | ORAL | 0 refills | Status: DC

## 2023-03-23 NOTE — Lactation Note (Signed)
 This note was copied from a baby's chart. Lactation Consultation Note  Patient Name: Beverly Anthony Today's Date: 03/23/2023 Age:28 hours Reason for consult: Follow-up assessment;Early term 37-38.6wks  P4, 37 wks, @ 45 hrs of life. Discussed milk coming in expectations. Highlighted baby sleepy at breast post-circumcision for a couple feeds. Highlighted risk of engorgement as milk comes in. Discussed moving milk regularly- demonstrated hand pump, if inflamed/engorged- hand expression, motrin , and ice for 10-20 minutes is best treatment. Re-enforced LC services, milk storage, and pump cleaning handouts with mom.  Feeding Mother's Current Feeding Choice: Breast Milk and Formula Nipple Type: Slow - flow   Interventions Interventions: Breast feeding basics reviewed;Hand pump;LC Services brochure;CDC Guidelines for Breast Pump Cleaning (Milk Storage Guidelines)  Discharge Discharge Education: Engorgement and breast care Pump: Manual (Per mom- does NOT have pump- demonstrated electric pump pieces- work as a hand pump- take home. Copy of how to get a pump through Medicaid- for [facility] -contact info provided to mom. Encouraged WIC, loaner pumps, or pump rental at gift shop available.)  Consult Status Consult Status: Complete Date: 03/23/23    Heinz Muskrat 03/23/2023, 10:40 AM

## 2023-03-23 NOTE — Plan of Care (Signed)
 Problem: Education: Goal: Knowledge of Childbirth will improve 03/23/2023 1014 by Madison Rosina LABOR, LPN Outcome: Adequate for Discharge 03/23/2023 1011 by Madison Rosina A, LPN Outcome: Progressing Goal: Ability to make informed decisions regarding treatment and plan of care will improve 03/23/2023 1014 by Madison Rosina LABOR, LPN Outcome: Adequate for Discharge 03/23/2023 1011 by Madison Rosina LABOR, LPN Outcome: Progressing Goal: Ability to state and carry out methods to decrease the pain will improve 03/23/2023 1014 by Madison Rosina LABOR, LPN Outcome: Adequate for Discharge 03/23/2023 1011 by Madison Rosina LABOR, LPN Outcome: Progressing Goal: Individualized Educational Video(s) 03/23/2023 1014 by Madison Rosina LABOR, LPN Outcome: Adequate for Discharge 03/23/2023 1011 by Madison Rosina LABOR, LPN Outcome: Progressing   Problem: Coping: Goal: Ability to verbalize concerns and feelings about labor and delivery will improve 03/23/2023 1014 by Madison Rosina LABOR, LPN Outcome: Adequate for Discharge 03/23/2023 1011 by Madison Rosina LABOR, LPN Outcome: Progressing   Problem: Life Cycle: Goal: Ability to make normal progression through stages of labor will improve 03/23/2023 1014 by Madison Rosina LABOR, LPN Outcome: Adequate for Discharge 03/23/2023 1011 by Madison Rosina LABOR, LPN Outcome: Progressing Goal: Ability to effectively push during vaginal delivery will improve 03/23/2023 1014 by Madison Rosina LABOR, LPN Outcome: Adequate for Discharge 03/23/2023 1011 by Madison Rosina LABOR, LPN Outcome: Progressing   Problem: Role Relationship: Goal: Will demonstrate positive interactions with the child 03/23/2023 1014 by Madison Rosina LABOR, LPN Outcome: Adequate for Discharge 03/23/2023 1011 by Madison Rosina LABOR, LPN Outcome: Progressing   Problem: Safety: Goal: Risk of complications during labor and delivery will decrease 03/23/2023 1014 by Madison Rosina LABOR, LPN Outcome: Adequate for Discharge 03/23/2023 1011 by Madison Rosina LABOR, LPN Outcome: Progressing   Problem: Pain  Management: Goal: Relief or control of pain from uterine contractions will improve 03/23/2023 1014 by Madison Rosina LABOR, LPN Outcome: Adequate for Discharge 03/23/2023 1011 by Madison Rosina LABOR, LPN Outcome: Progressing   Problem: Education: Goal: Knowledge of General Education information will improve Description: Including pain rating scale, medication(s)/side effects and non-pharmacologic comfort measures 03/23/2023 1014 by Madison Rosina LABOR, LPN Outcome: Adequate for Discharge 03/23/2023 1011 by Madison Rosina LABOR, LPN Outcome: Progressing   Problem: Health Behavior/Discharge Planning: Goal: Ability to manage health-related needs will improve 03/23/2023 1014 by Madison Rosina LABOR, LPN Outcome: Adequate for Discharge 03/23/2023 1011 by Madison Rosina LABOR, LPN Outcome: Progressing   Problem: Clinical Measurements: Goal: Ability to maintain clinical measurements within normal limits will improve 03/23/2023 1014 by Madison Rosina LABOR, LPN Outcome: Adequate for Discharge 03/23/2023 1011 by Madison Rosina LABOR, LPN Outcome: Progressing Goal: Will remain free from infection 03/23/2023 1014 by Madison Rosina LABOR, LPN Outcome: Adequate for Discharge 03/23/2023 1011 by Madison Rosina LABOR, LPN Outcome: Progressing Goal: Diagnostic test results will improve 03/23/2023 1014 by Madison Rosina LABOR, LPN Outcome: Adequate for Discharge 03/23/2023 1011 by Madison Rosina LABOR, LPN Outcome: Progressing Goal: Respiratory complications will improve 03/23/2023 1014 by Madison Rosina LABOR, LPN Outcome: Adequate for Discharge 03/23/2023 1011 by Madison Rosina LABOR, LPN Outcome: Progressing Goal: Cardiovascular complication will be avoided 03/23/2023 1014 by Madison Rosina LABOR, LPN Outcome: Adequate for Discharge 03/23/2023 1011 by Madison Rosina LABOR, LPN Outcome: Progressing   Problem: Activity: Goal: Risk for activity intolerance will decrease 03/23/2023 1014 by Madison Rosina LABOR, LPN Outcome: Adequate for Discharge 03/23/2023 1011 by Madison Rosina LABOR, LPN Outcome: Progressing    Problem: Nutrition: Goal: Adequate nutrition will be maintained 03/23/2023 1014 by Madison Rosina LABOR, LPN Outcome: Adequate for Discharge 03/23/2023 1011 by  Madison Knee A, LPN Outcome: Progressing   Problem: Coping: Goal: Level of anxiety will decrease 03/23/2023 1014 by Madison Knee LABOR, LPN Outcome: Adequate for Discharge 03/23/2023 1011 by Madison Knee A, LPN Outcome: Progressing   Problem: Elimination: Goal: Will not experience complications related to bowel motility 03/23/2023 1014 by Madison Knee LABOR, LPN Outcome: Adequate for Discharge 03/23/2023 1011 by Madison Knee LABOR, LPN Outcome: Progressing Goal: Will not experience complications related to urinary retention 03/23/2023 1014 by Madison Knee LABOR, LPN Outcome: Adequate for Discharge 03/23/2023 1011 by Madison Knee LABOR, LPN Outcome: Progressing   Problem: Pain Management: Goal: General experience of comfort will improve 03/23/2023 1014 by Madison Knee LABOR, LPN Outcome: Adequate for Discharge 03/23/2023 1011 by Madison Knee LABOR, LPN Outcome: Progressing   Problem: Safety: Goal: Ability to remain free from injury will improve 03/23/2023 1014 by Madison Knee LABOR, LPN Outcome: Adequate for Discharge 03/23/2023 1011 by Madison Knee LABOR, LPN Outcome: Progressing   Problem: Skin Integrity: Goal: Risk for impaired skin integrity will decrease 03/23/2023 1014 by Madison Knee LABOR, LPN Outcome: Adequate for Discharge 03/23/2023 1011 by Madison Knee LABOR, LPN Outcome: Progressing   Problem: Education: Goal: Knowledge of condition will improve 03/23/2023 1014 by Madison Knee LABOR, LPN Outcome: Adequate for Discharge 03/23/2023 1011 by Madison Knee LABOR, LPN Outcome: Progressing Goal: Individualized Educational Video(s) 03/23/2023 1014 by Madison Knee LABOR, LPN Outcome: Adequate for Discharge 03/23/2023 1011 by Madison Knee LABOR, LPN Outcome: Progressing Goal: Individualized Newborn Educational Video(s) 03/23/2023 1014 by Madison Knee LABOR, LPN Outcome: Adequate for Discharge 03/23/2023  1011 by Madison Knee LABOR, LPN Outcome: Progressing   Problem: Activity: Goal: Will verbalize the importance of balancing activity with adequate rest periods 03/23/2023 1014 by Madison Knee LABOR, LPN Outcome: Adequate for Discharge 03/23/2023 1011 by Madison Knee A, LPN Outcome: Progressing Goal: Ability to tolerate increased activity will improve 03/23/2023 1014 by Madison Knee LABOR, LPN Outcome: Adequate for Discharge 03/23/2023 1011 by Madison Knee LABOR, LPN Outcome: Progressing   Problem: Coping: Goal: Ability to identify and utilize available resources and services will improve 03/23/2023 1014 by Madison Knee LABOR, LPN Outcome: Adequate for Discharge 03/23/2023 1011 by Madison Knee LABOR, LPN Outcome: Progressing   Problem: Life Cycle: Goal: Chance of risk for complications during the postpartum period will decrease 03/23/2023 1014 by Madison Knee LABOR, LPN Outcome: Adequate for Discharge 03/23/2023 1011 by Madison Knee LABOR, LPN Outcome: Progressing   Problem: Role Relationship: Goal: Ability to demonstrate positive interaction with newborn will improve 03/23/2023 1014 by Madison Knee LABOR, LPN Outcome: Adequate for Discharge 03/23/2023 1011 by Madison Knee LABOR, LPN Outcome: Progressing   Problem: Skin Integrity: Goal: Demonstration of wound healing without infection will improve 03/23/2023 1014 by Madison Knee LABOR, LPN Outcome: Adequate for Discharge 03/23/2023 1011 by Madison Knee LABOR, LPN Outcome: Progressing

## 2023-03-23 NOTE — Plan of Care (Signed)
  Problem: Education: Goal: Knowledge of Childbirth will improve Outcome: Progressing Goal: Ability to make informed decisions regarding treatment and plan of care will improve Outcome: Progressing Goal: Ability to state and carry out methods to decrease the pain will improve Outcome: Progressing Goal: Individualized Educational Video(s) Outcome: Progressing   Problem: Coping: Goal: Ability to verbalize concerns and feelings about labor and delivery will improve Outcome: Progressing   Problem: Life Cycle: Goal: Ability to make normal progression through stages of labor will improve Outcome: Progressing Goal: Ability to effectively push during vaginal delivery will improve Outcome: Progressing   Problem: Role Relationship: Goal: Will demonstrate positive interactions with the child Outcome: Progressing   Problem: Safety: Goal: Risk of complications during labor and delivery will decrease Outcome: Progressing   Problem: Pain Management: Goal: Relief or control of pain from uterine contractions will improve Outcome: Progressing   Problem: Education: Goal: Knowledge of General Education information will improve Description: Including pain rating scale, medication(s)/side effects and non-pharmacologic comfort measures Outcome: Progressing   Problem: Health Behavior/Discharge Planning: Goal: Ability to manage health-related needs will improve Outcome: Progressing   Problem: Clinical Measurements: Goal: Ability to maintain clinical measurements within normal limits will improve Outcome: Progressing Goal: Will remain free from infection Outcome: Progressing Goal: Diagnostic test results will improve Outcome: Progressing Goal: Respiratory complications will improve Outcome: Progressing Goal: Cardiovascular complication will be avoided Outcome: Progressing   Problem: Activity: Goal: Risk for activity intolerance will decrease Outcome: Progressing   Problem:  Nutrition: Goal: Adequate nutrition will be maintained Outcome: Progressing   Problem: Coping: Goal: Level of anxiety will decrease Outcome: Progressing   Problem: Elimination: Goal: Will not experience complications related to bowel motility Outcome: Progressing Goal: Will not experience complications related to urinary retention Outcome: Progressing   Problem: Pain Management: Goal: General experience of comfort will improve Outcome: Progressing   Problem: Safety: Goal: Ability to remain free from injury will improve Outcome: Progressing   Problem: Skin Integrity: Goal: Risk for impaired skin integrity will decrease Outcome: Progressing   Problem: Education: Goal: Knowledge of condition will improve Outcome: Progressing Goal: Individualized Educational Video(s) Outcome: Progressing Goal: Individualized Newborn Educational Video(s) Outcome: Progressing   Problem: Activity: Goal: Will verbalize the importance of balancing activity with adequate rest periods Outcome: Progressing Goal: Ability to tolerate increased activity will improve Outcome: Progressing   Problem: Coping: Goal: Ability to identify and utilize available resources and services will improve Outcome: Progressing   Problem: Life Cycle: Goal: Chance of risk for complications during the postpartum period will decrease Outcome: Progressing   Problem: Role Relationship: Goal: Ability to demonstrate positive interaction with newborn will improve Outcome: Progressing   Problem: Skin Integrity: Goal: Demonstration of wound healing without infection will improve Outcome: Progressing

## 2023-03-23 NOTE — Discharge Instructions (Signed)
 WHAT TO LOOK OUT FOR: Fever of 100.4 or above Mastitis: feels like flu and breasts hurt Infection: increased pain, swelling or redness Blood clots golf ball size or larger Postpartum depression   Congratulations on your newest addition!

## 2023-03-29 ENCOUNTER — Telehealth (HOSPITAL_COMMUNITY): Payer: Self-pay

## 2023-03-29 NOTE — Telephone Encounter (Signed)
 03/29/2023 1106  Name: Beverly Anthony MRN: [id number] DOB: 18-Oct-1995  Reason for Call:  Transition of Care Hospital Discharge Call  Contact Status: Patient Contact Status: Message  Language assistant needed:          Follow-Up Questions:    Van Postnatal Depression Scale:  In the Past 7 Days:    PHQ2-9 Depression Scale:     Discharge Follow-up:    Post-discharge interventions: NA  Signature  Rosaline Deretha PEAK

## 2023-05-02 ENCOUNTER — Ambulatory Visit: Payer: Medicaid Other | Admitting: Obstetrics and Gynecology

## 2023-12-11 ENCOUNTER — Inpatient Hospital Stay (HOSPITAL_COMMUNITY)
Admission: AD | Admit: 2023-12-11 | Discharge: 2023-12-11 | Discharge status: Against Medical Advice | Attending: Obstetrics and Gynecology | Admitting: Obstetrics and Gynecology

## 2023-12-11 ENCOUNTER — Telehealth (HOSPITAL_COMMUNITY): Payer: Self-pay

## 2023-12-11 ENCOUNTER — Inpatient Hospital Stay (HOSPITAL_COMMUNITY)

## 2023-12-11 DIAGNOSIS — O26899 Other specified pregnancy related conditions, unspecified trimester: Secondary | ICD-10-CM | POA: Diagnosis not present

## 2023-12-11 DIAGNOSIS — R1084 Generalized abdominal pain: Secondary | ICD-10-CM | POA: Diagnosis not present

## 2023-12-11 DIAGNOSIS — Z658 Other specified problems related to psychosocial circumstances: Secondary | ICD-10-CM | POA: Insufficient documentation

## 2023-12-11 DIAGNOSIS — N939 Abnormal uterine and vaginal bleeding, unspecified: Secondary | ICD-10-CM

## 2023-12-11 DIAGNOSIS — Z3A00 Weeks of gestation of pregnancy not specified: Secondary | ICD-10-CM | POA: Insufficient documentation

## 2023-12-11 DIAGNOSIS — O209 Hemorrhage in early pregnancy, unspecified: Secondary | ICD-10-CM | POA: Insufficient documentation

## 2023-12-11 LAB — COMPREHENSIVE METABOLIC PANEL WITH GFR
ALT: 13 U/L (ref 0–44)
AST: 22 U/L (ref 15–41)
Albumin: 3.7 g/dL (ref 3.5–5.0)
Alkaline Phosphatase: 93 U/L (ref 38–126)
Anion gap: 11 (ref 5–15)
BUN: 7 mg/dL (ref 6–20)
CO2: 24 mmol/L (ref 22–32)
Calcium: 9.1 mg/dL (ref 8.9–10.3)
Chloride: 102 mmol/L (ref 98–111)
Creatinine, Ser: 1.04 mg/dL — ABNORMAL HIGH (ref 0.44–1.00)
GFR, Estimated: >60 mL/min (ref >60)
Glucose, Bld: 105 mg/dL — ABNORMAL HIGH (ref 70–99)
Potassium: 3.4 mmol/L — ABNORMAL LOW (ref 3.5–5.1)
Sodium: 137 mmol/L (ref 135–145)
Total Bilirubin: 0.6 mg/dL (ref 0.0–1.2)
Total Protein: 7.4 g/dL (ref 6.5–8.1)

## 2023-12-11 LAB — POCT PREGNANCY, URINE: Preg Test, Ur: NEGATIVE

## 2023-12-11 LAB — WET PREP, GENITAL
Sperm: NONE SEEN
Trich, Wet Prep: NONE SEEN
WBC, Wet Prep HPF POC: >=10 — AB (ref <10)
Yeast Wet Prep HPF POC: NONE SEEN

## 2023-12-11 LAB — CBC
HCT: 33.2 % — ABNORMAL LOW (ref 36.0–46.0)
Hemoglobin: 11.4 g/dL — ABNORMAL LOW (ref 12.0–15.0)
MCH: 27.2 pg (ref 26.0–34.0)
MCHC: 34.3 g/dL (ref 30.0–36.0)
MCV: 79.2 fL — ABNORMAL LOW (ref 80.0–100.0)
Platelets: 289 K/uL (ref 150–400)
RBC: 4.19 MIL/uL (ref 3.87–5.11)
RDW: 13.9 % (ref 11.5–15.5)
WBC: 17.1 K/uL — ABNORMAL HIGH (ref 4.0–10.5)
nRBC: 0 % (ref 0.0–0.2)

## 2023-12-11 LAB — ABO/RH: ABO/RH(D): B POS

## 2023-12-11 LAB — HCG, QUANTITATIVE, PREGNANCY: hCG, Beta Chain, Quant, S: 1 m[IU]/mL (ref <5)

## 2023-12-11 NOTE — ED Notes (Signed)
 Pt unable to give urine sample for POCT.

## 2023-12-11 NOTE — MAU Note (Signed)
 Patient attempting to leave urine sample, she states she needs to leave urgently due to childcare issues.

## 2023-12-11 NOTE — MAU Note (Signed)
..  [name] is a 28 y.o. at Unknown here in MAU reporting: +HPT, started having heavy bright red vaginal bleeding that started last night. She reports she is now passing clots. Also having lower abdominal cramping that started last night as well. LMP: 10/20/23 Pain score: 6 Vitals:   12/11/23 1330  BP: 99/71  Pulse: (!) 128  Resp: 14  Temp: 98.9 F (37.2 C)  SpO2: 100%      Lab orders placed from triage:   UPT

## 2023-12-11 NOTE — Telephone Encounter (Signed)
 Pt was on the phone with a nurse from her OBGYN. [facility]
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
## 2023-12-12 LAB — GC/CHLAMYDIA PROBE AMP (~~LOC~~) NOT AT ARMC
Chlamydia: NEGATIVE
Comment: NEGATIVE
Comment: NORMAL
Neisseria Gonorrhea: NEGATIVE

## 2023-12-15 ENCOUNTER — Ambulatory Visit: Payer: Self-pay

## 2024-02-22 ENCOUNTER — Encounter (HOSPITAL_COMMUNITY): Payer: Self-pay | Admitting: Family Medicine

## 2024-02-22 ENCOUNTER — Other Ambulatory Visit: Payer: Self-pay

## 2024-02-22 ENCOUNTER — Inpatient Hospital Stay (HOSPITAL_COMMUNITY)
Admission: AD | Admit: 2024-02-22 | Discharge: 2024-02-22 | Disposition: A | Discharge status: Home / Self Care | Attending: Family Medicine | Admitting: Family Medicine

## 2024-02-22 ENCOUNTER — Inpatient Hospital Stay (HOSPITAL_COMMUNITY)

## 2024-02-22 DIAGNOSIS — O219 Vomiting of pregnancy, unspecified: Secondary | ICD-10-CM | POA: Diagnosis not present

## 2024-02-22 DIAGNOSIS — O418X10 Other specified disorders of amniotic fluid and membranes, first trimester, not applicable or unspecified: Secondary | ICD-10-CM | POA: Diagnosis not present

## 2024-02-22 DIAGNOSIS — Z3491 Encounter for supervision of normal pregnancy, unspecified, first trimester: Secondary | ICD-10-CM

## 2024-02-22 DIAGNOSIS — O468X1 Other antepartum hemorrhage, first trimester: Secondary | ICD-10-CM

## 2024-02-22 DIAGNOSIS — K5900 Constipation, unspecified: Secondary | ICD-10-CM

## 2024-02-22 DIAGNOSIS — B9689 Other specified bacterial agents as the cause of diseases classified elsewhere: Secondary | ICD-10-CM

## 2024-02-22 DIAGNOSIS — O99611 Diseases of the digestive system complicating pregnancy, first trimester: Secondary | ICD-10-CM

## 2024-02-22 DIAGNOSIS — O23591 Infection of other part of genital tract in pregnancy, first trimester: Secondary | ICD-10-CM

## 2024-02-22 DIAGNOSIS — Z3A01 Less than 8 weeks gestation of pregnancy: Secondary | ICD-10-CM

## 2024-02-22 LAB — URINALYSIS, ROUTINE W REFLEX MICROSCOPIC
Bilirubin Urine: NEGATIVE
Glucose, UA: NEGATIVE mg/dL
Hgb urine dipstick: NEGATIVE
Ketones, ur: NEGATIVE mg/dL
Nitrite: NEGATIVE
Protein, ur: NEGATIVE mg/dL
Specific Gravity, Urine: 1.015 (ref 1.005–1.030)
pH: 6 (ref 5.0–8.0)

## 2024-02-22 LAB — CBC
HCT: 34.3 % — ABNORMAL LOW (ref 36.0–46.0)
Hemoglobin: 12 g/dL (ref 12.0–15.0)
MCH: 28 pg (ref 26.0–34.0)
MCHC: 35 g/dL (ref 30.0–36.0)
MCV: 80 fL (ref 80.0–100.0)
Platelets: 283 K/uL (ref 150–400)
RBC: 4.29 MIL/uL (ref 3.87–5.11)
RDW: 13.2 % (ref 11.5–15.5)
WBC: 11 K/uL — ABNORMAL HIGH (ref 4.0–10.5)
nRBC: 0 % (ref 0.0–0.2)

## 2024-02-22 LAB — WET PREP, GENITAL
Sperm: NONE SEEN
Trich, Wet Prep: NONE SEEN
WBC, Wet Prep HPF POC: >=10 — AB (ref <10)
Yeast Wet Prep HPF POC: NONE SEEN

## 2024-02-22 LAB — URINALYSIS, MICROSCOPIC (REFLEX): Bacteria, UA: NONE SEEN

## 2024-02-22 LAB — GC/CHLAMYDIA PROBE AMP (~~LOC~~) NOT AT ARMC
Chlamydia: NEGATIVE
Comment: NEGATIVE
Comment: NORMAL
Neisseria Gonorrhea: NEGATIVE

## 2024-02-22 LAB — HCG, QUANTITATIVE, PREGNANCY: hCG, Beta Chain, Quant, S: 102901 m[IU]/mL — ABNORMAL HIGH (ref <5)

## 2024-02-22 LAB — ABO/RH: ABO/RH(D): B POS

## 2024-02-22 LAB — POCT PREGNANCY, URINE: Preg Test, Ur: POSITIVE — AB

## 2024-02-22 MED ORDER — METRONIDAZOLE 500 MG PO TABS: 500.0000 mg | ORAL_TABLET | Freq: Two times a day (BID) | ORAL | 0 refills | Status: AC

## 2024-02-22 MED ORDER — METOCLOPRAMIDE HCL 10 MG PO TABS
10.0000 mg | ORAL_TABLET | Freq: Once | ORAL | Status: AC
  Administered 2024-02-22: 10 mg via ORAL
  Filled 2024-02-22: qty 1

## 2024-02-22 MED ORDER — METOCLOPRAMIDE HCL 10 MG PO TABS: 10.0000 mg | ORAL_TABLET | Freq: Three times a day (TID) | ORAL | 2 refills | Status: DC | PRN

## 2024-02-22 NOTE — Discharge Instructions (Signed)
 You have constipation which is hard stools that are difficult to pass. It is important to have regular bowel movements every 1-3 days that are soft and easy to pass. Hard stools increase your risk of hemorrhoids and are very uncomfortable.   To prevent constipation you can increase the amount of fiber in your diet. Examples of foods with fiber are leafy greens, whole grain breads, oatmeal and other grains.  It is also important to drink at least 64 ounces of water everyday.   If you have not had a bowel movement in 4-5 days, you made need to clean out your bowel.  This will help you establish normal movements through your bowel.    Miralax Clean out Take 8 capfuls of miralax in 64 oz of gatorade. You can use any fluid that appeals to you (gatorade, water, juice) Continue to drink at least 64 ounces of water throughout the day You can repeat with another 8 capfuls of miralax in 64 oz of gatorade if you are not having a large amount of stools You will need to be at home and close to a bathroom for about 8 hours when you do the above as you may need to go to the bathroom frequently.   After you are cleaned out: - Start Colace100mg  twice daily - Start Miralax once daily - Start a daily fiber supplement like metamucil or citrucel - You can safely use enemas in pregnancy  - if you are having diarrhea you can reduce to Colace once a day or miralax every other day or a 1/2 capful daily.       [facility] [employer] for [employer] at [facility]  [address] [place], [place] [address]  [phone]  Center for [facility] at [facility]  [address], [place], [place] [address]  [phone]  Center for [facility] at [facility] [address], [place], [place] [address]  [phone]  Center for [facility] at [facility] [address], [place], [place] [address] [phone]  Center for [facility] at  [facility]  [address] [place], [place] [address]  [phone]  Center for [facility] at [facility]  [address]First floor), [place], [place] [address]  [phone]  Center for  [facility] at [facility]  [address] [place], [place], [place] [address]  [phone]  [facility]  [address], [place], [place] [address]  [phone]  [facility]  [address] [place], [place], [place] [address]  [phone]  [facility]  [address] [place], [place] [address]  [phone]  [facility]  [address], [place], [place] [address]  [phone]  [facility]   [address] [place], [place] [address]  [phone]  [facility]  [address] [place], [place] [address]   [phone]  [facility] [address], [place], [place] [address] [phone]  [facility]  [address] [place], [place] [address]  [phone]

## 2024-02-22 NOTE — MAU Provider Note (Signed)
 History     CSN: [id number]  Arrival date and time: 02/22/24 1211   Event Date/Time   First Provider Initiated Contact with Patient 02/22/24 1425      Chief Complaint  Patient presents with   Emesis   Nausea   Abdominal Pain   HPI Beverly Anthony is a 28 y.o. [id number] at [redacted]w[redacted]d who presents n/v & abdominal pain. Reports periumbilical pain that started 3 days ago & is intermittent. Pain worse with vomiting. Has vomited 4 times in the last 24 hours. Does not have antiemetic at home. Last BM 1 week ago. Hasn't treated constipation. Denies fever, dysuria, vaginal bleeding.   OB History     Gravida  8   Para  4   Term  4   Preterm  0   AB  3   Living  4      SAB  1   IAB  2   Ectopic  0   Multiple  0   Live Births  4           Past Medical History:  Diagnosis Date   Obesity    UTI (urinary tract infection)     Past Surgical History:  Procedure Laterality Date   EXTERNAL EAR SURGERY Bilateral 2017   keloid removal   INDUCED ABORTION     keloid     removal on ear    Family History  Problem Relation Age of Onset   Cancer Paternal Grandfather    Diabetes Father    Hypertension Father     Social History   Tobacco Use   Smoking status: Never   Smokeless tobacco: Never  Vaping Use   Vaping status: Never Used  Substance Use Topics   Alcohol use: No   Drug use: No    Allergies: No Known Allergies  Medications Prior to Admission  Medication Sig Dispense Refill Last Dose/Taking   docusate sodium  (COLACE) 100 MG capsule Take 1 capsule (100 mg total) by mouth 2 (two) times daily. 60 capsule 0    famotidine  (PEPCID ) 20 MG tablet Take 20 mg by mouth daily.      glycopyrrolate  (ROBINUL ) 1 MG tablet Take 1 tablet (1 mg total) by mouth 3 (three) times daily as needed. 90 tablet 0    ibuprofen  (ADVIL ) 600 MG tablet Take 1 tablet (600 mg total) by mouth every 6 (six) hours. 30 tablet 0    ondansetron  (ZOFRAN ) 4 MG tablet Take 4 mg by mouth every 8  (eight) hours as needed for nausea or vomiting.      pantoprazole  (PROTONIX ) 20 MG tablet Take 1 tablet (20 mg total) by mouth daily. 30 tablet 1     Review of Systems  All other systems reviewed and are negative.  Physical Exam   Blood pressure (!) 102/59, pulse 62, temperature 98.2 F (36.8 C), temperature source Oral, resp. rate 18, height 5' 2 (1.575 m), weight 93 kg, last menstrual period 01/03/2024, SpO2 100%, unknown if currently breastfeeding.  Physical Exam Vitals and nursing note reviewed.  Constitutional:      General: She is not in acute distress.    Appearance: She is well-developed. She is not ill-appearing.  HENT:     Head: Normocephalic and atraumatic.  Eyes:     General: No scleral icterus.       Right eye: No discharge.        Left eye: No discharge.     Conjunctiva/sclera: Conjunctivae normal.  Pulmonary:  Effort: Pulmonary effort is normal. No respiratory distress.  Neurological:     General: No focal deficit present.     Mental Status: She is alert.  Psychiatric:        Mood and Affect: Mood normal.        Behavior: Behavior normal.     MAU Course  Procedures Results for orders placed or performed during the hospital encounter of 02/22/24 (from the past 24 hours)  Pregnancy, urine POC     Status: Abnormal   Collection Time: 02/22/24  1:24 PM  Result Value Ref Range   Preg Test, Ur POSITIVE (A) NEGATIVE  Urinalysis, Routine w reflex microscopic -Urine, Clean Catch     Status: Abnormal   Collection Time: 02/22/24  1:32 PM  Result Value Ref Range   Color, Urine YELLOW YELLOW   APPearance CLEAR CLEAR   Specific Gravity, Urine 1.015 1.005 - 1.030   pH 6.0 5.0 - 8.0   Glucose, UA NEGATIVE NEGATIVE mg/dL   Hgb urine dipstick NEGATIVE NEGATIVE   Bilirubin Urine NEGATIVE NEGATIVE   Ketones, ur NEGATIVE NEGATIVE mg/dL   Protein, ur NEGATIVE NEGATIVE mg/dL   Nitrite NEGATIVE NEGATIVE   Leukocytes,Ua TRACE (A) NEGATIVE  Urinalysis, Microscopic  (reflex)     Status: None   Collection Time: 02/22/24  1:32 PM  Result Value Ref Range   RBC / HPF 0-5 0 - 5 RBC/hpf   WBC, UA 0-5 0 - 5 WBC/hpf   Bacteria, UA NONE SEEN NONE SEEN   Squamous Epithelial / HPF 0-5 0 - 5 /HPF  Wet prep, genital     Status: Abnormal   Collection Time: 02/22/24  2:37 PM   Specimen: Vaginal  Result Value Ref Range   Yeast Wet Prep HPF POC NONE SEEN NONE SEEN   Trich, Wet Prep NONE SEEN NONE SEEN   Clue Cells Wet Prep HPF POC PRESENT (A) NONE SEEN   WBC, Wet Prep HPF POC >=10 (A) <10   Sperm NONE SEEN   ABO/Rh     Status: None   Collection Time: 02/22/24  2:45 PM  Result Value Ref Range   ABO/RH(D) B POS    No rh immune globuloin      NOT A RH IMMUNE GLOBULIN CANDIDATE, PT RH POSITIVE Performed at [facility], [address] [place], [place] [address]   CBC     Status: Abnormal   Collection Time: 02/22/24  2:46 PM  Result Value Ref Range   WBC 11.0 (H) 4.0 - 10.5 K/uL   RBC 4.29 3.87 - 5.11 MIL/uL   Hemoglobin 12.0 12.0 - 15.0 g/dL   HCT 65.6 (L) 63.9 - 53.9 %   MCV 80.0 80.0 - 100.0 fL   MCH 28.0 26.0 - 34.0 pg   MCHC 35.0 30.0 - 36.0 g/dL   RDW 86.7 88.4 - 84.4 %   Platelets 283 150 - 400 K/uL   nRBC 0.0 0.0 - 0.2 %  hCG, quantitative, pregnancy     Status: Abnormal   Collection Time: 02/22/24  2:46 PM  Result Value Ref Range   hCG, Beta Chain, Quant, S 102,901 (H) <5 mIU/mL   US  OB LESS THAN 14 WEEKS WITH OB TRANSVAGINAL Result Date: 02/22/2024 EXAM: OBSTETRIC ULTRASOUND FIRST TRIMESTER TECHNIQUE: Transvaginal first trimester obstetric pelvic duplex ultrasound was performed with real-time imaging, color flow Doppler imaging, and spectral analysis. COMPARISON: 12/11/2023 CLINICAL HISTORY: Abdominal pain during pregnancy in first trimester. FINDINGS: UTERUS: No focal myometrial mass. GESTATIONAL  SAC(S): Single normal appearing intrauterine gestational sac. Small subchorionic hemorrhage. YOLK SAC: Present EMBRYO(<11WK) /FETUS(>=11WK):  Single CROWN RUMP LENGTH: 7.4 mm RATE OF CARDIAC ACTIVITY: 131 beats per minute. RIGHT OVARY: Unremarkable. LEFT OVARY: Unremarkable. FREE FLUID: No free fluid. MEASUREMENTS ESTIMATED GESTATIONAL AGE BY CURRENT ULTRASOUND: 6 weeks 4 days ESTIMATED GESTATIONAL AGE BY LMP/PRIOR ULTRASOUND: 7 weeks 1 day (by LMP) ESTIMATED DUE DATE: 10/13/2024 (from current ultrasound) and 10/09/2024 (by LMP). IMPRESSION: 1. Intrauterine pregnancy with embryonic/fetal cardiac activity. Estimated gestational age by current ultrasound is 6 weeks 4 days. 2. Small subchorionic hemorrhage. Electronically signed by: Franky Crease MD 02/22/2024 03:40 PM EST RP Workstation: [id number]    MDM   Assessment and Plan   1. Nausea and vomiting during pregnancy  -Symptoms improved with reglan  given in MAU -Rx reglan   2. Normal IUP (intrauterine pregnancy) on prenatal ultrasound, first trimester  -Ultrasound shows live IUP. Given list of prenatal providers & encouraged to schedule prenatal care  3. Subchorionic hematoma in first trimester, single or unspecified fetus  -RH positive. No vaginal bleeding.  -Reviewed bleeding precautions.   4. Constipation during pregnancy in first trimester  -Discussed management of constipation at home  5. [redacted] weeks gestation of pregnancy   6. Bacterial vaginosis in pregnancy  -Rx flagyl      Rocky Satterfield 02/22/2024, 2:25 PM

## 2024-02-22 NOTE — MAU Note (Signed)
 Beverly Anthony is a 28 y.o. at Unknown here in MAU reporting: she's pregnant and having   abdominal pain near umbilicus that began 3 days ago.  Reports pain is intermittent with a tightening burning feeling.  Also complain N/V, states not keeping anything down, vomited 4x in past 24 hours.  Denies VB.   LMP: 01/03/2024 estimated Onset of complaint: 3 days ago Pain score: 1 Vitals:   02/22/24 1310  BP: (!) 102/59  Pulse: 62  Resp: 18  Temp: 98.2 F (36.8 C)  SpO2: 100%     FHT: NA  Lab orders placed from triage: UPT

## 2024-04-09 ENCOUNTER — Telehealth: Payer: Self-pay

## 2024-04-09 DIAGNOSIS — Z3491 Encounter for supervision of normal pregnancy, unspecified, first trimester: Secondary | ICD-10-CM | POA: Diagnosis not present

## 2024-04-09 DIAGNOSIS — Z3490 Encounter for supervision of normal pregnancy, unspecified, unspecified trimester: Secondary | ICD-10-CM | POA: Insufficient documentation

## 2024-04-09 DIAGNOSIS — Z3492 Encounter for supervision of normal pregnancy, unspecified, second trimester: Secondary | ICD-10-CM

## 2024-04-09 DIAGNOSIS — Z3A13 13 weeks gestation of pregnancy: Secondary | ICD-10-CM

## 2024-04-09 MED ORDER — BLOOD PRESSURE KIT DEVI: 1.0000 | 0 refills | Status: AC | PRN

## 2024-04-09 MED ORDER — PRENATAL 27-1 MG PO TABS: 1.0000 | ORAL_TABLET | Freq: Every day | ORAL | 12 refills | Status: DC

## 2024-04-09 NOTE — Patient Instructions (Signed)
 Safe Medications in Pregnancy   Acne:  Benzoyl Peroxide  Salicylic Acid   Backache/Headache:  Tylenol: 2 regular strength every 4 hours OR               2 Extra strength every 6 hours   Colds/Coughs/Allergies:  Benadryl (alcohol free) 25 mg every 6 hours as needed  Breath right strips  Claritin  Cepacol throat lozenges  Chloraseptic throat spray  Cold-Eeze- up to three times per day  Cough drops, alcohol free  Flonase (by prescription only)  Guaifenesin  Mucinex  Robitussin DM (plain only, alcohol free)  Saline nasal spray/drops  Sudafed (pseudoephedrine) & Actifed * use only after [redacted] weeks gestation and if you do not have high blood pressure  Tylenol  Vicks Vaporub  Zinc lozenges  Zyrtec   Constipation:  Colace  Ducolax suppositories  Fleet enema  Glycerin suppositories  Metamucil  Milk of magnesia  Miralax  Senokot  Smooth move tea   Diarrhea:  Kaopectate  Imodium A-D   *NO pepto Bismol   Hemorrhoids:  Anusol  Anusol HC  Preparation H  Tucks   Indigestion:  Tums  Maalox  Mylanta  Zantac  Pepcid   Insomnia:  Benadryl (alcohol free) 25mg  every 6 hours as needed  Tylenol PM  Unisom, no Gelcaps   Leg Cramps:  Tums  MagGel   Nausea/Vomiting:  Bonine  Dramamine  Emetrol  Ginger extract  Sea bands  Meclizine  Nausea medication to take during pregnancy:  Unisom (doxylamine succinate 25 mg tablets) Take one tablet daily at bedtime. If symptoms are not adequately controlled, the dose can be increased to a maximum recommended dose of two tablets daily (1/2 tablet in the morning, 1/2 tablet mid-afternoon and one at bedtime).  Vitamin B6 100mg  tablets. Take one tablet twice a day (up to 200 mg per day).   Skin Rashes:  Aveeno products  Benadryl cream or 25mg  every 6 hours as needed  Calamine Lotion  1% cortisone cream   Yeast infection:  Gyne-lotrimin 7  Monistat 7    **If taking multiple medications, please check labels to avoid  duplicating the same active ingredients  **take medication as directed on the label  ** Do not exceed 4000 mg of tylenol in 24 hours  **Do not take medications that contain aspirin or ibuprofen            [facility] Pediatric Providers  [place] ([address]) [facility] Manson Passey, MD; Deirdre Priest, MD; Lum Babe, MD; Leveda Anna, MD; McDiarmid, MD; Jerene Bears, MD [address] [place] [place], [place] [address] [phone] Mon-Fri 8:30-12:30, 1:30-5:00  Providers come to see babies during newborn hospitalization Only accepting infants of Mother's who are seen at [facility] or have siblings seen at   [facility] Medicaid - Yes; Tricare - Yes   [facility], MD [address] [place], [place] [address] [phone] Mon, Tue, Thur, Fri 8:30-5:00, Wed 10:00-7:00 (closed 1-2pm daily for lunch) [facility] residents with no insurance.  [place] [employer] only with Medicaid/insurance; Tricare - no  [facility]) - Tim and [facility], MD; Manson Passey, MD; Ave Filter, MD; Luna Fuse, MD; Kennedy Bucker, MD; Florestine Avers, MD; Melchor Amour, MD; Yetta Barre,  MD; Konrad Dolores, MD; Kathlene November, MD; Jenne Campus, MD; Wynetta Emery, MD; Duffy Rhody, MD; Gerre Couch, NP [address] [place], [place], [place] [address] [phone] Mon, Tue, Thur, Fri 8:30-5:30, Wed 9:30-5:30, Sat 8:30-12:30 Only accepting infants of first-time parents or siblings of current  patients Hospital discharge coordinator will make follow-up appointment Medicaid - yes; Tricare - yes  [place] ([address]) [facility], MD; Earlene Plater, MD; Jamesetta Orleans, MD; Alvera Novel, MD; Rana Snare, MD; [facility], MD; De Graff, MD; Hosie Poisson, MD; Mayford Knife, MD [address], [place], [place] [address] [phone] Mon-Fri 8:30-5:00, closed for lunch 12:30-1:30; Sat-Sun 10:00-1:00 Accepting Newborns with commercial insurance only, must call prior to  delivery to be accepted into  practice.  Medicaid - no, Tricare - yes   [facility] [address] [place], [place] [address] [phone] or [phone] Mon to Fri 8am to 10pm, Sat 8am to 1pm (virtual only on weekends) Only accepts Medicaid Healthy Blue pts  [facility] ([facility]) - Pediatrics at Elige Radon, MD; Sabino Dick, MD; Quitman Livings, MD; Betha Loa, NP; Claretha Cooper, MD; Lelon Perla, MD [address] [place] [place], [place] [address] [phone] Mon-Fri 8:30-5:30 Medicaid - yes, Tricare - yes  [place] [phone]) [facility] Mowers, MD [address], [place], [place] [address] [phone] Iona Hansen, Wed Fri 8:30-5:00, Sat 8:30-12:00, Closed Thursdays Accepting siblings of established patients and first time mom's if you call prenatally Medicaid- yes; Tricare - yes  [facility] Lutricia Feil, [place]; Tracie Harrier, MD; Rusty Aus; Scifres, PA; Wynelle Link, MD; Azucena Cecil, MD;  [address] [place], [place] [address] [phone] Mon-Fri 8:30-5:00, closed for lunch 1-2 Only accepting newborns of established patients Medicaid- no; Tricare - yes  [facility] [phone]) [facility] Morene Crocker, MD; [address], [place], [place] [address] [phone] Mon-Fri 8:00-5:00 Medicaid - No; Tricare - Yes  [facility] at [facility], [place]; [place], [place] [address], [place], [place] [address] [phone] Mon-Fri 8:00-5:00 Medicaid - No, Tricare - Yes  [facility] Cardell Peach, MD; Nash Dimmer, MD; [place], [place] [address] [place], [place] [address] [phone]  Mon-Fri 8:00-5:00 Medicaid - No; Tricare - Yes  [facility] [address], [place], [place] [address] [phone] Mon-Fri 8:30-5:00 (lunch 12:00-1:00) Medicaid -Yes; Tricare - Yes  [facility], MD [address] [place],  [place], [place] [address] [phone] Mon-Fri 8:00-5:00 Seeing newborns of current patients only. No new patients Medicaid - No, Tricare - yes  Nature conservation officer at [place] [address] MD [address] [place], [place] [address] [phone] Mon-Fri 8:00-5:00 Medicaid -yes as secondary coverage only; Tricare - yes  [facility] [place], [place]; [place], [place]; Avis Epley, MD; Vonna Kotyk, MD; Clance Boll, MD; [place], [place]; [place], NP; Vaughan Basta, MD; [place], MD [address] [place], [place] [address] [phone] Mon-Fri 8:30-5:00, Sat 9:00-11:00 Accepts commercial insurance ONLY. Offers free prenatal information sessions for families. Medicaid - No, Tricare - Call first  [facility] [place], MD; [place], [place]; [place], [place]; [place], [place] [address] [place] [address] [phone] Mon-Fri 7:30-5:30 Medicaid - Yes; Ailene Rud yes  [place] [phone] & [phone])  [facility], MD [address] [place], [place] [address] [phone] Mon-Thur 8:00-6:00, closed for lunch 12-2, closed Fridays Medicaid - yes; Tricare - no  [facility] Dareen Piano, NP; Cyndia Bent, MD; [place], [place]; [place], [place] [address], [place], [place] [address] [phone] Mon-Fri 7:30-4:30 Medicaid - yes, Tricare - yes  [facility]  Juanito Doom, MD; Janene Harvey, NP; Vonita Moss, MD; Donn Pierini, NP [address], [place], [place] [address] [phone] Mon-Fri 8:30-5:00, closed for lunch 1-2, Sat 8:30-12:00 - sick visits only Providers come to see  babies at [facility] Only accepting newborns of siblings and first time parents ONLY if who have met with office prior to delivery Medicaid -Yes; Tricare - yes  [facility] - [place], [place]; Spero Geralds, NP; Earlene Plater, MD; Lucretia Roers, MD:  [address], [place], [place] [address] [phone] Mon- Fri 8:00-5:00, Sat 9:00-12:00 - sick  visits only Accepting siblings of established patients and first time mom/baby Medicaid - Yes; Tricare - yes Patients must have vaccinations (baby vaccines)  [place] [phone] & [phone])  Adult nurse HealthCare at [facility] [address] [place], [place] [address] [phone] Mon-Fri 8:00-5:00 Medicaid - no; Tricare - yes  [facility] [place], MD; [place], [place]; [place], [place] [address], [place], [place] [address] [phone] Mon-Fri 8:00-5:00 Medicaid- yes; Tricare - yes  [facility] - Ardeen Jourdain, MD; Yetta Barre, NP; [place], [place] [address] [place], [place], [place] [address] [phone] Mon-Fri 8:00-5:00 Medicaid - Yes; Tricare - yes  [address] [place] [phone])  [facility] [place], [place]; [place], [place]; Eddie Candle, MD; Normand Sloop, MD; [place], NP; [place], DO; [place], [place]; Constance Goltz, MD; Ruthann Cancer, MD; Vear Clock, MD; [place], [place]; [place], [place]; [place], [place] [address] [address] [facility] [address], [place], [place] [address] [phone] Mon-Fri 8:30-5:00, Sat 9:00-12:00 - sick only Please register online [email or web address] then schedule online or call office Medicaid-Yes; Tricare -yes  [facility] - [place]  [place], MD; Romualdo Bolk, MD; Kirby, MD; [place], NP; [place], [place]; Antonietta Barcelona, MD; Mayford Knife, NP; Shelva Majestic, MD [address], [place], [place] [address] [phone] Mon-Fri 8:00-5:30, Sat&Sun by appointment (phones open at 8:30) Medicaid - Yes; Tricare - yes  [place] [phone] & [phone]) [facility] Pediatrics Mariel Aloe; [place], MD; Roger Shelter, MD; Arvilla Market, NP; [place], DO [address], [place], [place] [address] [phone] M-F 8:00 - 5:15, Sat/Sun 9-12 sick visits only Medicaid - No; Tricare - yes  [facility]  [place], PA-C; [place], PA-C; [place], DO; [place], PA-C; [place], PA-C; Roselyn Bering, MD [address] [place], [place] [address] [phone] Mon-Thur 8:00-7:00, Fri 8:00-5:00 Accepting Medicaid for 13 and under only   [facility] - Family Medicine at [place] [facility] [place]) [place], [place]; List, FNP; Berneda Rose, MD; Luther Redo, PA-C; Lavonia Drafts, MD; Kellie Simmering, FNP; Genevie Cheshire, FNP; Evaristo Bury, MD; Berneda Rose, MD [phone] [address] [place], [place] [address] [phone] Mon-Fri 8:30-5:30 Medicaid - Yes; Tricare - yes  [facility] - [address]  [place], [place]; Whitney Post, MD; Hennie Duos, MD; Wynne Dust, MD; [place], NP [address], [place], [place] [address] [phone] Mon-Thur 8:00-5:30, Fri 8:00-5:00, Sat 9:00-12:00 Medicaid - yes, Tricare - yes  [place] [phone])  [facility], [place]; Lenise Arena, MD; [place], [place] [address], [place], [place] [address] [phone] Mon-Fri 8:00-5:00, closed for lunch 12-1 Medicaid - No; Tricare - yes  Nature conservation officer at [facility], MD [address] [place], [place] [address] [phone] Mon-Fri 8:00-5:00 Medicaid - No; Tricare - yes  [facility], MD; Tami Ribas, MD; Mariam Dollar, MD; Yetta Barre, MD [address], [place], [place] [address] [phone] Mon-Fri 8:00-5:00 Medicaid- Yes; Tricare - yes  [place] [phone])  Adult nurse HealthCare at [facility], [place]; [place], MD [address] [place], [place], [place] [address] [phone] Mon-Fri 8:00-5:00 Medicaid - No; Tricare - yes  [facility] - CPNP [address] [place], [place] [address] [phone] Mon-Weds 8:00-6:00, Thurs-Fri 8:00-5:00, Sat 9:00-12:00 Medicaid - yes; Tricare - yes   [facility] Katharina Caper, MD; [place], [place] [address] [place], [place] [address] [phone] Mon-Fri 8:00-5:00 Medicaid - yes; Tricare - yes  [employer]  Pediatric Providers  [facility] [address], [place], [place] [address] [phone] Sheral Flow: 8am -8pm, Tues, Weds: 8am - 5pm; Fri: 8-1 Medicaid - Yes; Tricare - yes  [facility] Rachel Bo, MD; Laural Benes, MD; Anner Crete, MD; [place], [place]; [place], [place] [address], [place], [place] [address] [phone] M-F 8:30 - 5:00 Medicaid - Call office; Tricare -yes  [facility] Edson Snowball, MD; Shanon Rosser, MD, Chelsea Primus, MD; Shirlyn Goltz, PNP; Wardell Heath, NP [phone] [address], [place], [place] [address] [phone] M-F 8:30 - 5:00, Sat/Sun 8:30 - 12:30 (sick visits) Medicaid - Call office; Tricare -yes  [facility] Melvyn Neth, MD; Karl Luke, PNP; Princess Bruins, MD; [place], [place]; [place], NP; Cynda Familia [address], [place], [place] [address] [phone] M-F 8:30 - 5:00 Medicaid - Call office; Tricare - yes  [facility] Jesusita Oka, MD; Dierdre Highman, MD; Earnest Conroy, MD; Timothy Lasso, MD; [place], MD [phone] [address] [place], [place] [address] [phone] M-Thur: 8:00 - 5:00; Fri: 8:00 - 4:00 Medicaid - yes; Tricare - yes  [facility] [address] [place], [place] [address] [phone] M-F: 8:30- 5:00, closed for lunch 12:30 - 1:00 Medicaid - yes; Tricare -yes  [facility] [address] [place], [place] [address] [phone] M-F 8:00 - 5:00 Medicaid - yes; Tricare - yes  [facility] [place], DO; [place], DO; [place], NP [address], [place], [place] [address] [phone] M-F 8:00 - 5:00, Closed 12-1 for lunch Medicaid - Call; Tricare - yes  [facility] Meredith Mody, MD [address], [place], [place] [address] [phone] M-F: 8:00-5:00, Sat: 8:00 - noon Medicaid - call; Tricare -yes  [facility] Pediatric Providers  [facility] [place], [place] [address] [place], [place], [place] [address] [phone] M-W: 8:00-5:00, Thur: 8:00 -  7:00, Fri: 8:00 - noon Medicaid - yes; Tricare - yes  [facility] - Quay Burow, FNP [address], [place], [place] [address] [phone] M-F 8:00 - 5:00, Closed for lunch 12-1 Medicaid - yes; Tricare - yes  [facility] Pediatric Providers  [facility] Primary Care at [place], [place], Alinda Money, MD, [place], FNP-C [address], [facility], [address], [place], [place] [address] [phone] M-T 8:00-5:00, Wed-Fri 7:00-6:00 Medicaid - Yes; Tricare -yes  [facility] at [facility], DO; [address] Saner [place], [place] [address] [phone] M-F 8:00 - 5:00, closed for lunch 12-1 Medicaid - Yes; Tricare - yes  [facility] Pediatrics and Internal Medicine  Zachery Dauer, MD; Gladstone Lighter, MD; Collie Siad, MD; Freda Jackson, MD; Rich Number, MD; Darryl Nestle, MD; Melinda Crutch, MD, Audria Nine, MD; Tawanna Cooler, MD; Steffanie Dunn, MD; Byrd Hesselbach, MD; Lucretia Roers, MD [address] [place], [place] [address] [phone] M-F 8:00-5:00 Medicaid - yes; Tricare - yes  [facility] [place], MD (speaks [place] and Hindi) [address] [place], [place] [address] [phone] M-F: 8:30 - 5:00, closed 12:30 - 1 for lunch Medicaid - Yes; Tricare -yes  [facility] Pediatric Providers  [facility] Shanda Bumps, MD; Chanetta Marshall, MD; Laurell Josephs,  MD [address] [place], [place] [address] [phone] M-Th: 8:00 - 5:30, Fri: 8:00 - 12:00 Medicaid - yes; Tricare - yes  [facility] Pediatrics at [facility], NP; Thora Lance, MD; Orrin Brigham, MD [phone] [address] [place], [place] [address] [phone] M-F: 8:00 - 5:00 Medicaid - yes; Tricare - yes  [facility] [place], NP; Orson Slick, NP; Salley Scarlet, NP; Linton Flemings, MD; Mayford Knife, MD, [place], NP, Emelda Fear, MD; Nida Boatman [address], [place], [place] [address] [phone] M-F: 8:30 - 5:30p Medicaid - yes; Tricare - yes Other locations available as well  [facility], MD; Andrey Campanile, MD; Neville Route, PA-C [address], [place], [place] [address] [phone] M-W: 8:00am - 7:00pm, Thurs: 8:00am - 8:00pm; Fri: 8:00am - 5:00pm, closed daily from 12-1 for lunch Medicaid - yes; Tricare - yes  [facility] Pediatric Providers  [facility] at Levin Erp, MD; Aggie Cosier, FNP; Bland Span, MD; Tristan Schroeder, MD; [place], PNP; Alesia Banda; [place], [place]; Julian Reil, MD;  [address], [place], [place] [address] [phone] Judie Petit - Caleen Essex: 8am - 5pm, Sat 9-noon Medicaid - Yes; Tricare -yes  [facility] at Jaclynn Guarneri, MD; Yetta Barre, FNP; Lilian Kapur, MD; Mariam Dollar, MD [address] [place], [place] [phone] M-F 8:00 - 5:00 Medicaid - call; Tricare - yes  [facility]- Cruz Condon, MD; [place], [place]; Delora Fuel, MD; Dareen Piano, MD; Trudee Grip, MD; Kizzie Ide, MD; Zebedee Iba; Birdena Crandall, MD; Hinton Dyer, MD; [place], MD [address], [place], [place] [address] [phone] M-F 8:00am - 5:00pm; Sat. 9:00 - 11:00 Medicaid - yes; Tricare - yes  [facility] at [facility], MD [address] [place], [place] [address] [phone] M-F 8:00 - 5:00 Medicaid - [place] Medicaid only; Tricare - yes  [facility] - Illene Bolus, MD; Earlene Plater, [place]; Kenyon Ana, MD [address], [place], [place] [address] [phone] M-F 8:00 - 5:00 Medicaid - yes; Tricare - yes  [facility] [address] Pediatrics - Lind Covert, MD; Manson Passey, MD, [facility], MD, [place], MD; [place], MD; Katrinka Blazing, MD; [address] [place], [place] [address] [phone] M-F: 8-5 Medicaid - yes; Tricare - yes  [facility] - Henrietta Hoover, [place]; [place], MD; [address] [place], [place] [address] [phone] M-F 8-5 Medicaid - yes; Tricare - yes  [address] [place] Darrol Poke, MD; Tami Ribas, MD; [place], MD; [place], DNP;  [place], PNP [address], [place], [place] [address] [phone] M-F 8-5 Medicaid - yes; Tricare - yes  [facility] Internal Medicine and Pediatrics Delories Heinz, MD; Adrienne Mocha; Ala Bent, MD [address] [place], [place] [address] [phone] M-F 7am - 5 pm Medicaid - call; Tricare - yes  [facility] [place], [place]; Fredia Beets, MD; Roxan Hockey, MD [address] [place], [place] [address] [phone] M-F 8-5 Medicaid - yes; Tricare - yes  [facility] Kae Heller, MD; Sheliah Hatch, MD; Mayford Knife, FNP; Shon Baton, FNP; Tyron Russell, FNP; Ishmael Holter; [facility] - FNP [address] [place], [place] [address] [phone] M-F 8-5 Medicaid- yes; Tricare - yes  [facility] - Betsy Coder, [place] and [place] Guest, MD; Terrial Rhodes, MD; Hulda Humphrey, MD; Roseanne [place], MD; [place], [place]; Ala Dach, MD; Fredia Beets, MD; Dimple Casey, MD [address], [place], [place] [address] [phone] M-F: 8-5, Sat: 9-4, Sun 9-12 Medicaid - yes; Tricare - yes  [facility] [place], PNP; [place], PNP [address] [place],  [place] [address] [phone] M-F 8 - 5, closed 12-1 for lunch Medicaid - yes; Tricare - yes  [facility] Kathyrn Lass, MD; Hal Neer, MD; Dimple Casey, MD; [place], DO [address] [place], [place] [address] [phone] M-F 8- 5:30 Medicaid - yes; Tricare - yes  [facility] - Biagio Quint, MD; Rosalia Hammers, MD; Gwenith Daily, MD [address] [place], [place] [address] [phone] Judie Petit: Nicholas Lose; Tues-Fri: 8-5; Sat: 9-12 Medicaid - yes; Tricare - yes  [facility] - Bobbye Morton, MD; Daphane Shepherd, MD; Chestine Spore, MD; Haskell Riling, MD; Kate Sable, MD [address] [place], [place] [address] [phone] Judie PetitMarland Kitchen Nicholas LoseFrancee Nodal: 8-5; Sat: 8:30-12:30 Medicaid - yes;  Tricare - yes  [facility] Heck[facility]s - Beckey Rutter, MD; [place], [place] [address], [place], [place] [address] [phone] Mon-Fri: 8-5 Medicaid - yes; Tricare - yes  [facility] Pediatrics - [place], CPNP; [place], [place]; Dimple Casey, MD; Alisa Graff, MD; Cephus Shelling, MD; [address], [place], [place] [address] [phone] M-F: 8-5, closed 1-2 for lunch Medicaid - yes; Tricare - yes  [facility]s - [facility] [place], [place]; [place], [place]; Katrinka Blazing, MD; [place], CPNP; [place], [place]; [place], MD; Earlene Plater, MD [address], [place], [place] [address] [phone] M-Thurs: Nicholas Lose; Fri: 8-6; Sat: 9-12; Sun 2-4 Medicaid - yes; Tricare - yes  [facility] Georgeanna Lea, MD; Evette Cristal, MD; Shea Stakes, FNP; Earney Mallet, DO; [address] [place], [place] [address] [phone] M-F: 8-5 Medicaid - yes; Tricare - yes  [facility] Pediatric Providers  [facility] Family Medicine -Collene Mares, MD; [place], NP [address], [place], [place] [address] [phone] M - Fri: 8am - 5pm, closed for lunch 12-1 Medicaid - Yes; Tricare - yes  [facility] Elinor Parkinson, MD; Victory Dakin, MD; [place], DO; Vinocur, MD;Hall, PA; Clent [place], [place]; Orvan Falconer, NP [phone] [address] [place], [place] [address] [phone] M-F 8:00 - 5:00, Sat 8:00 - 11:30 Medicaid - yes; Tricare - yes  [facility] Welton Flakes, MD; [place], MD, [address], MD, [place], MD, [place], MD; [place], NP; [place], [place];  [address] [place], [place] [address] [phone] M-F 8:10am - 5:00pm Medicaid - yes; Tricare - yes  [facility] Eustaquio Boyden, MD; [place], NP [address] [place], [place] [address] [phone] M-F 8:00 - 5:00 Medicaid - [place] Medicaid only; Tricare - yes  [facility] - [facility] [address] [place], MD; [place], NP [address], [place], [place] [address] [phone] M-F 8:00 - 5:00; Closed for lunch 12 - 1:00 Medicaid - yes; Tricare - yes  [facility] Belva Crome, MD; Jonita Albee, FNP [address], [place], [place] [address] [phone] Mon 9-5; Tues/Wed 10-5; Thurs 8:30-5; Fri: 8-12:30 Medicaid - yes; Tricare - yes  [facility] Pediatric Providers  [facility]  [place], MD; [place], [place] [address] [place], [place] [address] [phone] phone [phone] fax M-F 7:15 - 4:30 Medicaid - yes; Tricare - yes  [place] -  Pediatrics Karilyn Cota, MD; [place], DO [address], [place], [place] [address] [phone] M-Fri: 8:30 - 5:00, closed for lunch everyday noon - 1pm Medicaid - Yes; Tricare - yes  [facility] Burd[place], MD; Reuel Boom, MD; Dimas Aguas, MD; Neita Carp, MD; [place], [place]; Bonnita Nasuti, [place]; [place], [place]; [place],  [place]; [place], [place] [address] [place], [place] [address] [phone] M-Thurs: 7:30am - 7:00pm; Friday 7:30am - 4pm; Sat: 8:00 - 1:00 Medicaid - Yes; Tricare - yes  [place] [facility] Nor[facility] Morton, MD; Conni Elliot, MD; Carroll Kinds, MD; [place], DO [address], [place], [place] [address] [phone] M-Thur: 8:00 - 5:00, Fri: 8:00 - Noon Medicaid - yes; Tricare - yes No  [employer]  [place] [facility] [place], MD; Nadine Counts, DO; [place], NP; Daphine Deutscher, NP; Lequita Halt, NP; Ellamae Sia, NP; Reginia Forts, NP; Darlyn Read, MD; [place], [place] [address], [place], [place] [address] [phone] M-F 8:00 - 5:00 Medicaid - yes; Tricare - yes  [facility], FNP-C; [place], FNP-C [address] [place], [place] [address] [phone] M, W, R 8:00-5:00, Tues: 8:00am - 7:00pm; Fri 8:00 - noon Medicaid - Yes; Tricare - yes  [facility], MD [address] [place], [place] [address] [phone]  M-Thurs 8:30-5:30, Fri: 8:30-12:30pm Medicaid - Yes; Tricare - N    Considering Waterbirth? Guide for patients at [facility]) Why consider waterbirth? Gentle birth for babies  Less pain medicine used in labor  May allow for passive descent/less pushing  May reduce perineal tears  More mobility and instinctive maternal position changes  Increased maternal relaxation   Is waterbirth safe? What are the risks of infection, drowning or other complications? Infection:  Very low risk (3.7 % for tub vs 4.8% for bed)  7 in 8000 waterbirths with documented infection  Poorly cleaned equipment most common cause  Slightly lower group B strep transmission rate  Drowning  Maternal:  Very low risk  Related to seizures or fainting  Newborn:  Very low risk. No evidence of increased risk of respiratory problems in multiple large studies  Physiological protection from breathing under water  Avoid underwater birth if there are any fetal complications  Once baby's head is out of the water, keep it out.  Birth complication  Some reports of cord trauma, but risk decreased by bringing baby to surface gradually  No evidence of increased risk of shoulder dystocia. Mothers can usually change positions faster in water than in a bed, possibly aiding the maneuvers to free the shoulder.   There are 2 things you MUST do to have a waterbirth with [facility]: Attend a waterbirth class at [facility]   3rd Wednesday of every month from 7-9 pm (virtual during COVID) [employer] at [email or web address] or [email or web address] or by calling [phone] Bring [place] the certificate from the class to your prenatal appointment or send via MyChart Meet with a midwife at 36 weeks* to see if you can still plan a waterbirth and to sign the consent.   *We also recommend that you schedule as many of your prenatal  visits with a midwife as possible.    Helpful information: You may want to bring a bathing suit top to the hospital to wear during labor but this is optional.  All other supplies are provided by the hospital. Please arrive at the hospital with signs of active labor, and do not wait at home until late in labor. It takes 45 min- 1 hour for fetal monitoring, and check in to your room to take place, plus transport and filling of the waterbirth tub.    Things that would prevent you from having a waterbirth: Premature, <37wks  Previous cesarean birth  Presence of thick meconium-stained fluid  Multiple gestation (Twins, triplets, etc.)  Uncontrolled diabetes or gestational diabetes requiring medication  Hypertension diagnosed in pregnancy or preexisting hypertension (gestational hypertension, preeclampsia, or chronic hypertension) Fetal growth restriction (your baby measures less than 10th percentile on ultrasound) Heavy vaginal bleeding  Non-reassuring fetal heart rate  Active infection (MRSA, etc.). Group B Strep is NOT a contraindication for waterbirth.  If your labor has to be induced and induction method requires continuous monitoring of the baby's heart rate  Other risks/issues identified by your obstetrical provider   Please remember that birth is unpredictable. Under certain unforeseeable circumstances your provider may advise against giving birth in the tub. These decisions will be made on a case-by-case basis and with the safety of you and your baby as our highest priority.

## 2024-04-09 NOTE — Progress Notes (Signed)
 New OB Intake  I connected with Beverly Anthony  on 04/09/24 at  1:15 PM EST by MyChart Video Visit and verified that I am speaking with the correct person using two identifiers. Nurse is located at [facility] and pt is located at home.  I discussed the limitations, risks, security and privacy concerns of performing an evaluation and management service by telephone and the availability of in person appointments. I also discussed with the patient that there may be a patient responsible charge related to this service. The patient expressed understanding and agreed to proceed.  I explained I am completing New OB Intake today. We discussed EDD of 10/09/2024 based on LMP of 01/03/2024. Pt is [id number]. I reviewed her allergies, medications and Medical/Surgical/OB history.    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 Discussed the nature of our practice with multiple providers including residents and students as well as female and female providers. Due to the size of the practice, the delivering provider may not be the same as those providing prenatal care.   Patient is interested in water birth.  MyChart/Babyscripts MyChart access verified. I explained pt will have some visits in office and some virtually. Babyscripts instructions given and order placed. Patient verifies receipt of registration text/e-mail. Account successfully created and app downloaded. If patient is a candidate for Optimized scheduling, add to sticky note.   Blood Pressure Cuff/Weight Scale Blood pressure cuff ordered for patient to pick-up from [employer]. Explained after first prenatal appt pt will check weekly and document in Babyscripts.   Anatomy US  Explained first scheduled US  will be around 19 weeks. Anatomy US  scheduled for 05/08/2024 at 1:00pm.  Is patient a  CenteringPregnancy candidate?  Declined Wants babyscripts scheduling    Is patient a Mom+Baby Combined Care candidate?  Not a candidate   If accepted, confirm patient does not intend to move from the area for at least 12 months, then notify Mom+Baby staff  Is patient a candidate for Babyscripts Optimization? Yes, patient accepted    First visit review I reviewed new OB appt with patient. Explained pt will be seen by Olam Boards, CNM at first visit. Discussed [place] genetic screening with patient. Thinking about [facility].. Routine prenatal labs is needed at new ob appointment.  Last Pap No results found for: DIAGPAP  Irene ONEIDA Lee, CMA 04/09/2024  1:19 PM

## 2024-04-16 ENCOUNTER — Ambulatory Visit (INDEPENDENT_AMBULATORY_CARE_PROVIDER_SITE_OTHER): Payer: Self-pay | Admitting: Advanced Practice Midwife

## 2024-04-16 ENCOUNTER — Other Ambulatory Visit: Payer: Self-pay

## 2024-04-16 ENCOUNTER — Encounter: Payer: Self-pay | Admitting: Advanced Practice Midwife

## 2024-04-16 VITALS — BP 117/77 | HR 101 | Wt 204.9 lb

## 2024-04-16 DIAGNOSIS — O219 Vomiting of pregnancy, unspecified: Secondary | ICD-10-CM

## 2024-04-16 DIAGNOSIS — R12 Heartburn: Secondary | ICD-10-CM

## 2024-04-16 DIAGNOSIS — O99612 Diseases of the digestive system complicating pregnancy, second trimester: Secondary | ICD-10-CM | POA: Diagnosis not present

## 2024-04-16 DIAGNOSIS — Z3A14 14 weeks gestation of pregnancy: Secondary | ICD-10-CM | POA: Diagnosis not present

## 2024-04-16 DIAGNOSIS — O26892 Other specified pregnancy related conditions, second trimester: Secondary | ICD-10-CM

## 2024-04-16 DIAGNOSIS — K5900 Constipation, unspecified: Secondary | ICD-10-CM

## 2024-04-16 DIAGNOSIS — Z3492 Encounter for supervision of normal pregnancy, unspecified, second trimester: Secondary | ICD-10-CM

## 2024-04-16 MED ORDER — PRENATAL 27-1 MG PO TABS: 1.0000 | ORAL_TABLET | Freq: Every day | ORAL | 12 refills | Status: AC

## 2024-04-16 MED ORDER — METOCLOPRAMIDE HCL 10 MG PO TABS: 10.0000 mg | ORAL_TABLET | Freq: Three times a day (TID) | ORAL | 2 refills | Status: AC | PRN

## 2024-04-16 MED ORDER — FAMOTIDINE 20 MG PO TABS: 20.0000 mg | ORAL_TABLET | Freq: Two times a day (BID) | ORAL | 2 refills | Status: AC | PRN

## 2024-04-16 MED ORDER — PROMETHAZINE HCL 12.5 MG PO TABS: 12.5000 mg | ORAL_TABLET | Freq: Every evening | ORAL | 0 refills | Status: AC | PRN

## 2024-04-16 NOTE — Progress Notes (Signed)
 "    Subjective:   Beverly Anthony is a 29 y.o. [id number] at [redacted]w[redacted]d by LMP being seen today for her first obstetrical visit.  Her obstetrical history is significant for vaginal delivery x 4 and has Sickle cell trait; Herpes simplex vulvovaginitis; and Supervision of low-risk pregnancy on their problem list.. Patient does intend to breast feed. Pregnancy history fully reviewed.  Patient reports nausea, improved but not well improved with Reglan .  HISTORY: OB History  Gravida Para Term Preterm AB Living  8 4 4  0 3 4  SAB IAB Ectopic Multiple Live Births  1 2 0 0 4    # Outcome Date GA Lbr Len/2nd Weight Sex Type Anes PTL Lv  8 Current           7 Term 03/21/23 [redacted]w[redacted]d / 00:03 7 lb 1.9 oz (3.23 kg) M Vag-Spont EPI  LIV     Name: Darilyn Leodis [place]     Apgar1: 8  Apgar5: 9  6 IAB 2022     TAB     5 IAB 01/2020          4 Term 09/02/17 [redacted]w[redacted]d 07:55 / 00:02 6 lb 8.4 oz (2.96 kg) M Vag-Spont EPI  LIV     Name: Ponds,BOY Manjot     Apgar1: 8  Apgar5: 9  3 Term 08/16/16 [redacted]w[redacted]d 51:20 / 00:01 7 lb 5.8 oz (3.34 kg) M Vag-Spont EPI  LIV     Name: CAMPBELL,BOY Shaquitta     Apgar1: 8  Apgar5: 9  2 Term 03/25/15 [redacted]w[redacted]d 21:07 / 01:59 7 lb 3 oz (3.26 kg) M Vag-Spont EPI, Local  LIV     Birth Comments: wnl and c/w GA with noted molding     Name: CAMPBELL,BOY Caysie     Apgar1: 1  Apgar5: 9  1 SAB 02/2014 [redacted]w[redacted]d          Past Medical History:  Diagnosis Date   Endometritis 02/08/2020   Added automatically from request for surgery [id number]     Gallbladder stone without cholecystitis or obstruction 02/09/2015   GBS bacteriuria 01/18/2020   LGSIL of cervix of undetermined significance 12/13/2022   12/02/22--colpo done . One small pinpoint white area--no other abnormalities seen. Would recommend repeat pap with HPV screening and colpo as needed in postpartum period.     Normal labor 03/21/2023   Obesity    Obesity complicating pregnancy 11/16/2022   Pregravid BMI: 47.54     First trimester:  [ ]   Baseline HELLP labs + UP/C  [ ]  TSH  [ ]  A1c  [ ]  Early GTT  [ ]  Consider nutrition consult  [x ] Consider LDASA from 12-16 wks (if 1 high-risk factor or 2 moderate risk factors)- discussed and encouraged to start   [ ]  Screen for OSA; [ ]  refer to sleep medicine if indicated  [ ]  Consider baseline EKG if BMI > 40  [ ]  Echocardiogram if abnormal EKG,    Post-dates pregnancy 09/02/2017   S/P dilatation and curettage 02/10/2020   Spontaneous vaginal delivery 09/02/2017   Supervision of normal pregnancy 04/28/2017   New OB 05/04/17        Clinic     [facility]    Prenatal Labs      Dating    LMP/13wk US     Blood type: --/--/B POS (05/28 2030)       Genetic Screen    1 Screen:    AFP:     Quad:  NIPS: DECLINED    Antibody:NEG (05/28 2030)      Anatomic US     Wnl, incomplete, Follow-Up US , normal     Rubella: 1.58 (04/01 1017)imm      GTT    Early:               Third trimester: 74/99/65    RPR: Non Reactive (04   Unwanted fertility 08/15/2017   BTL planned      UTI (urinary tract infection)    Viral infection affecting pregnancy in third trimester 06/14/2016   Past Surgical History:  Procedure Laterality Date   EXTERNAL EAR SURGERY Bilateral 2017   keloid removal   INDUCED ABORTION     keloid     removal on ear   Family History  Problem Relation Age of Onset   Cancer Paternal Grandfather    Diabetes Father    Hypertension Father    Social History[1] Allergies[2] Medications Ordered Prior to Encounter[3]  Exam   Vitals:   04/16/24 1424  BP: 117/77  Pulse: (!) 101  Weight: 204 lb 14.4 oz (92.9 kg)   Fetal Heart Rate (bpm): 150  VS reviewed, nursing note reviewed,  Constitutional: well developed, well nourished, no distress HEENT: normocephalic CV: normal rate Pulm/chest wall: normal effort Abdomen: soft Neuro: alert and oriented x 3 Skin: warm, dry Psych: affect normal    Assessment:   Pregnancy: [id number] Patient Active Problem List   Diagnosis Date Noted   Supervision  of low-risk pregnancy 04/09/2024   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 Encounter for supervision of low-risk pregnancy in second trimester (Primary) --Anticipatory guidance about next visits/weeks of pregnancy given.   - CBC/D/Plt+RPR+Rh+ABO+RubIgG... - Culture, OB Urine - HgB A1c - GC/Chlamydia probe amp ([place])not at [facility]  2. Nausea and vomiting during pregnancy --Added Phenergan  at bedtime prn for nausea  3. Constipation during pregnancy in first trimester --Discussed high fiber foods, will use Phenergan  and not Zofran  for nausea, and increase water intake  4. [redacted] weeks gestation of pregnancy     Initial labs drawn. Continue prenatal vitamins. Discussed and offered genetic screening options, including Quad screen/AFP, NIPS testing, and option to decline testing. Benefits/risks/alternatives reviewed. Pt aware that anatomy US  is form of genetic screening with lower accuracy in detecting trisomies than blood work.  Pt chooses/declines genetic screening today. NIPS: declined. Ultrasound discussed; fetal anatomic survey: ordered. Problem list reviewed and updated. The nature of [place] [facility] with multiple MDs and other Advanced Practice Providers was explained to patient; also emphasized that residents, students are part of our team. Routine obstetric precautions reviewed. Return in about 5 weeks (around 05/21/2024) for LOB.   Olam Boards, CNM 04/16/24 5:52 PM     [1]  Social History Tobacco Use   Smoking status: Never   Smokeless tobacco: Never  Vaping Use   Vaping status: Never Used  Substance Use Topics   Alcohol use: Not Currently   Drug use: No  [2] No Known Allergies [3]  Current Outpatient Medications on File Prior to Visit  Medication Sig Dispense Refill   Blood Pressure Monitoring (BLOOD PRESSURE KIT) DEVI 1 each by Does not apply route as needed. 1 each 0    docusate sodium  (COLACE) 100 MG capsule Take 1 capsule (100 mg total) by mouth 2 (two) times daily. (Patient not taking: Reported on 04/09/2024) 60 capsule 0   famotidine  (PEPCID ) 20 MG  tablet Take 20 mg by mouth daily. (Patient not taking: Reported on 04/09/2024)     metroNIDAZOLE  (FLAGYL ) 500 MG tablet Take 1 tablet (500 mg total) by mouth 2 (two) times daily. (Patient not taking: Reported on 04/16/2024) 14 tablet 0   No current facility-administered medications on file prior to visit.   "

## 2024-04-17 LAB — CBC/D/PLT+RPR+RH+ABO+RUBIGG...
Antibody Screen: NEGATIVE
Basophils Absolute: 0 10*3/uL (ref 0.0–0.2)
Basos: 0 %
EOS (ABSOLUTE): 0.5 10*3/uL — ABNORMAL HIGH (ref 0.0–0.4)
Eos: 5 %
HCV Ab: NONREACTIVE
HIV Screen 4th Generation wRfx: NONREACTIVE
Hematocrit: 41.4 % (ref 34.0–46.6)
Hemoglobin: 13.2 g/dL (ref 11.1–15.9)
Hepatitis B Surface Ag: NEGATIVE
Immature Grans (Abs): 0 10*3/uL (ref 0.0–0.1)
Immature Granulocytes: 0 %
Lymphocytes Absolute: 1.4 10*3/uL (ref 0.7–3.1)
Lymphs: 15 %
MCH: 28.9 pg (ref 26.6–33.0)
MCHC: 31.9 g/dL (ref 31.5–35.7)
MCV: 91 fL (ref 79–97)
Monocytes Absolute: 0.5 10*3/uL (ref 0.1–0.9)
Monocytes: 5 %
Neutrophils Absolute: 7.3 10*3/uL — ABNORMAL HIGH (ref 1.4–7.0)
Neutrophils: 75 %
Platelets: 265 10*3/uL (ref 150–450)
RBC: 4.56 x10E6/uL (ref 3.77–5.28)
RDW: 14.1 % (ref 11.7–15.4)
RPR Ser Ql: NONREACTIVE
Rh Factor: POSITIVE
Rubella Antibodies, IGG: 1.37 {index}
WBC: 9.7 10*3/uL (ref 3.4–10.8)

## 2024-04-17 LAB — HCV INTERPRETATION

## 2024-04-17 LAB — HEMOGLOBIN A1C
Est. average glucose Bld gHb Est-mCnc: 94 mg/dL
Hgb A1c MFr Bld: 4.9 % (ref 4.8–5.6)

## 2024-04-18 ENCOUNTER — Encounter: Payer: Self-pay | Admitting: *Deleted

## 2024-04-18 LAB — URINE CULTURE, OB REFLEX

## 2024-04-18 LAB — CULTURE, OB URINE

## 2024-05-08 ENCOUNTER — Other Ambulatory Visit
